# Patient Record
Sex: Female | Born: 1935 | Race: White | Hispanic: No | Smoking: Light tobacco smoker
Health system: Southern US, Community
[De-identification: ages and names within clinical notes are randomized; demographics above are authoritative.]

## PROBLEM LIST (undated history)

## (undated) DIAGNOSIS — R1900 Intra-abdominal and pelvic swelling, mass and lump, unspecified site: Secondary | ICD-10-CM

## (undated) DIAGNOSIS — L570 Actinic keratosis: Secondary | ICD-10-CM

## (undated) DIAGNOSIS — N858 Other specified noninflammatory disorders of uterus: Secondary | ICD-10-CM

## (undated) DIAGNOSIS — C4490 Unspecified malignant neoplasm of skin, unspecified: Secondary | ICD-10-CM

## (undated) DIAGNOSIS — C4491 Basal cell carcinoma of skin, unspecified: Secondary | ICD-10-CM

## (undated) DIAGNOSIS — R102 Pelvic and perineal pain: Secondary | ICD-10-CM

## (undated) DIAGNOSIS — K559 Vascular disorder of intestine, unspecified: Secondary | ICD-10-CM

## (undated) DIAGNOSIS — N719 Inflammatory disease of uterus, unspecified: Secondary | ICD-10-CM

## (undated) DIAGNOSIS — N952 Postmenopausal atrophic vaginitis: Secondary | ICD-10-CM

## (undated) DIAGNOSIS — K219 Gastro-esophageal reflux disease without esophagitis: Secondary | ICD-10-CM

## (undated) HISTORY — DX: Inflammatory disease of uterus, unspecified: N71.9

## (undated) HISTORY — PX: OTHER SURGICAL HISTORY: SHX169

## (undated) HISTORY — DX: Pelvic and perineal pain: R10.2

## (undated) HISTORY — PX: TONSILLECTOMY: SUR1361

## (undated) HISTORY — DX: Other specified noninflammatory disorders of uterus: N85.8

## (undated) HISTORY — DX: Actinic keratosis: L57.0

## (undated) HISTORY — DX: Vascular disorder of intestine, unspecified: K55.9

## (undated) HISTORY — DX: Gastro-esophageal reflux disease without esophagitis: K21.9

## (undated) HISTORY — DX: Intra-abdominal and pelvic swelling, mass and lump, unspecified site: R19.00

## (undated) HISTORY — PX: CHOLECYSTECTOMY: SHX55

## (undated) HISTORY — DX: Postmenopausal atrophic vaginitis: N95.2

## (undated) HISTORY — DX: Unspecified malignant neoplasm of skin, unspecified: C44.90

---
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
## 2004-12-07 ENCOUNTER — Ambulatory Visit: Payer: Self-pay | Admitting: Internal Medicine

## 2005-06-25 ENCOUNTER — Ambulatory Visit: Payer: Self-pay | Admitting: Internal Medicine

## 2005-06-28 ENCOUNTER — Ambulatory Visit: Payer: Self-pay | Admitting: Internal Medicine

## 2005-12-10 ENCOUNTER — Ambulatory Visit: Payer: Self-pay | Admitting: Internal Medicine

## 2006-08-06 ENCOUNTER — Ambulatory Visit: Payer: Self-pay | Admitting: Unknown Physician Specialty

## 2006-10-02 ENCOUNTER — Ambulatory Visit: Payer: Self-pay | Admitting: Internal Medicine

## 2006-12-11 ENCOUNTER — Ambulatory Visit: Payer: Self-pay | Admitting: Unknown Physician Specialty

## 2006-12-25 ENCOUNTER — Ambulatory Visit: Payer: Self-pay | Admitting: Internal Medicine

## 2007-12-26 ENCOUNTER — Ambulatory Visit: Payer: Self-pay | Admitting: Internal Medicine

## 2008-12-27 ENCOUNTER — Ambulatory Visit: Payer: Self-pay | Admitting: Internal Medicine

## 2009-02-26 ENCOUNTER — Emergency Department: Payer: Self-pay | Admitting: Internal Medicine

## 2009-05-18 DIAGNOSIS — C4492 Squamous cell carcinoma of skin, unspecified: Secondary | ICD-10-CM

## 2009-05-18 HISTORY — DX: Squamous cell carcinoma of skin, unspecified: C44.92

## 2009-12-28 ENCOUNTER — Ambulatory Visit: Payer: Self-pay | Admitting: Internal Medicine

## 2010-10-03 ENCOUNTER — Ambulatory Visit: Payer: Self-pay | Admitting: Unknown Physician Specialty

## 2010-10-11 ENCOUNTER — Other Ambulatory Visit: Payer: Self-pay | Admitting: Unknown Physician Specialty

## 2010-11-08 ENCOUNTER — Ambulatory Visit: Payer: Self-pay

## 2010-12-04 ENCOUNTER — Ambulatory Visit: Payer: Self-pay | Admitting: Unknown Physician Specialty

## 2011-01-05 ENCOUNTER — Ambulatory Visit: Payer: Self-pay | Admitting: Internal Medicine

## 2011-02-05 ENCOUNTER — Ambulatory Visit: Payer: Self-pay | Admitting: Ophthalmology

## 2011-02-12 ENCOUNTER — Ambulatory Visit: Payer: Self-pay | Admitting: Ophthalmology

## 2011-06-25 ENCOUNTER — Ambulatory Visit: Payer: Self-pay | Admitting: Ophthalmology

## 2012-01-31 ENCOUNTER — Ambulatory Visit: Payer: Self-pay | Admitting: Internal Medicine

## 2013-01-13 ENCOUNTER — Emergency Department: Payer: Self-pay | Admitting: Emergency Medicine

## 2013-01-13 LAB — COMPREHENSIVE METABOLIC PANEL
Albumin: 3.8 g/dL (ref 3.4–5.0)
Alkaline Phosphatase: 92 U/L (ref 50–136)
Bilirubin,Total: 0.3 mg/dL (ref 0.2–1.0)
Calcium, Total: 9.2 mg/dL (ref 8.5–10.1)
Creatinine: 0.7 mg/dL (ref 0.60–1.30)
Glucose: 140 mg/dL — ABNORMAL HIGH (ref 65–99)
Potassium: 4.6 mmol/L (ref 3.5–5.1)
SGOT(AST): 28 U/L (ref 15–37)
SGPT (ALT): 22 U/L (ref 12–78)
Sodium: 135 mmol/L — ABNORMAL LOW (ref 136–145)

## 2013-01-13 LAB — CBC
HCT: 40.4 % (ref 35.0–47.0)
MCH: 32.6 pg (ref 26.0–34.0)
MCHC: 35.4 g/dL (ref 32.0–36.0)
MCV: 92 fL (ref 80–100)
Platelet: 169 10*3/uL (ref 150–440)
RBC: 4.39 10*6/uL (ref 3.80–5.20)
RDW: 14.8 % — ABNORMAL HIGH (ref 11.5–14.5)
WBC: 8.1 10*3/uL (ref 3.6–11.0)

## 2013-01-13 LAB — URINALYSIS, COMPLETE
Bacteria: NONE SEEN
Bilirubin,UR: NEGATIVE
Glucose,UR: NEGATIVE mg/dL (ref 0–75)
Leukocyte Esterase: NEGATIVE
Protein: NEGATIVE
Specific Gravity: 1.004 (ref 1.003–1.030)
Squamous Epithelial: <1

## 2013-02-02 ENCOUNTER — Ambulatory Visit: Payer: Self-pay | Admitting: Internal Medicine

## 2013-09-20 ENCOUNTER — Emergency Department: Payer: Self-pay | Admitting: Emergency Medicine

## 2013-09-20 LAB — BASIC METABOLIC PANEL
ANION GAP: 4 — AB (ref 7–16)
BUN: 13 mg/dL (ref 7–18)
CALCIUM: 8 mg/dL — AB (ref 8.5–10.1)
Chloride: 98 mmol/L (ref 98–107)
Co2: 26 mmol/L (ref 21–32)
Creatinine: 0.69 mg/dL (ref 0.60–1.30)
EGFR (African American): >60
Glucose: 115 mg/dL — ABNORMAL HIGH (ref 65–99)
Osmolality: 258 (ref 275–301)
POTASSIUM: 3.8 mmol/L (ref 3.5–5.1)
Sodium: 128 mmol/L — ABNORMAL LOW (ref 136–145)

## 2013-09-20 LAB — CBC
HCT: 24.4 % — ABNORMAL LOW (ref 35.0–47.0)
HGB: 7.9 g/dL — AB (ref 12.0–16.0)
MCH: 24.9 pg — AB (ref 26.0–34.0)
MCHC: 32.1 g/dL (ref 32.0–36.0)
MCV: 77 fL — AB (ref 80–100)
PLATELETS: 194 10*3/uL (ref 150–440)
RBC: 3.16 10*6/uL — AB (ref 3.80–5.20)
RDW: 15.1 % — ABNORMAL HIGH (ref 11.5–14.5)
WBC: 7.7 10*3/uL (ref 3.6–11.0)

## 2013-09-20 LAB — PRO B NATRIURETIC PEPTIDE: B-Type Natriuretic Peptide: 179 pg/mL (ref 0–450)

## 2013-09-20 LAB — TROPONIN I

## 2013-09-25 ENCOUNTER — Emergency Department: Payer: Self-pay | Admitting: Emergency Medicine

## 2013-09-25 LAB — COMPREHENSIVE METABOLIC PANEL
ALK PHOS: 96 U/L
Albumin: 3.4 g/dL (ref 3.4–5.0)
Anion Gap: 6 — ABNORMAL LOW (ref 7–16)
BILIRUBIN TOTAL: 0.1 mg/dL — AB (ref 0.2–1.0)
BUN: 16 mg/dL (ref 7–18)
Calcium, Total: 8.4 mg/dL — ABNORMAL LOW (ref 8.5–10.1)
Chloride: 95 mmol/L — ABNORMAL LOW (ref 98–107)
Co2: 27 mmol/L (ref 21–32)
Creatinine: 0.99 mg/dL (ref 0.60–1.30)
EGFR (African American): >60
GFR CALC NON AF AMER: 53 — AB
GLUCOSE: 190 mg/dL — AB (ref 65–99)
Osmolality: 263 (ref 275–301)
Potassium: 4.2 mmol/L (ref 3.5–5.1)
SGOT(AST): 28 U/L (ref 15–37)
SGPT (ALT): 26 U/L (ref 12–78)
SODIUM: 128 mmol/L — AB (ref 136–145)
TOTAL PROTEIN: 7.3 g/dL (ref 6.4–8.2)

## 2013-09-25 LAB — CBC
HCT: 22.3 % — ABNORMAL LOW (ref 35.0–47.0)
HGB: 7.1 g/dL — ABNORMAL LOW (ref 12.0–16.0)
MCH: 24.6 pg — ABNORMAL LOW (ref 26.0–34.0)
MCHC: 31.9 g/dL — ABNORMAL LOW (ref 32.0–36.0)
MCV: 77 fL — ABNORMAL LOW (ref 80–100)
Platelet: 303 10*3/uL (ref 150–440)
RBC: 2.89 10*6/uL — AB (ref 3.80–5.20)
RDW: 15.5 % — ABNORMAL HIGH (ref 11.5–14.5)
WBC: 9.8 10*3/uL (ref 3.6–11.0)

## 2013-09-29 ENCOUNTER — Ambulatory Visit: Payer: Self-pay | Admitting: Gastroenterology

## 2013-09-29 ENCOUNTER — Inpatient Hospital Stay: Payer: Self-pay | Admitting: Surgery

## 2013-09-29 LAB — URINALYSIS, COMPLETE
BLOOD: NEGATIVE
Bilirubin,UR: NEGATIVE
Glucose,UR: 150 mg/dL (ref 0–75)
Ketone: NEGATIVE
LEUKOCYTE ESTERASE: NEGATIVE
NITRITE: NEGATIVE
Ph: 5 (ref 4.5–8.0)
Protein: NEGATIVE
RBC,UR: 1 /HPF (ref 0–5)
Specific Gravity: 1.017 (ref 1.003–1.030)
Squamous Epithelial: <1
WBC UR: 1 /HPF (ref 0–5)

## 2013-09-29 LAB — CBC WITH DIFFERENTIAL/PLATELET
BASOS ABS: 0.1 10*3/uL (ref 0.0–0.1)
Basophil %: 0.3 %
EOS PCT: 0.2 %
Eosinophil #: 0 10*3/uL (ref 0.0–0.7)
HCT: 30.3 % — AB (ref 35.0–47.0)
HGB: 9.3 g/dL — ABNORMAL LOW (ref 12.0–16.0)
LYMPHS ABS: 0.8 10*3/uL — AB (ref 1.0–3.6)
LYMPHS PCT: 4.3 %
MCH: 25 pg — ABNORMAL LOW (ref 26.0–34.0)
MCHC: 30.7 g/dL — AB (ref 32.0–36.0)
MCV: 82 fL (ref 80–100)
Monocyte #: 0.5 x10 3/mm (ref 0.2–0.9)
Monocyte %: 2.7 %
NEUTROS ABS: 17.6 10*3/uL — AB (ref 1.4–6.5)
Neutrophil %: 92.5 %
Platelet: 357 10*3/uL (ref 150–440)
RBC: 3.72 10*6/uL — ABNORMAL LOW (ref 3.80–5.20)
RDW: 17.4 % — AB (ref 11.5–14.5)
WBC: 19 10*3/uL — ABNORMAL HIGH (ref 3.6–11.0)

## 2013-09-29 LAB — KOH PREP

## 2013-09-29 LAB — COMPREHENSIVE METABOLIC PANEL
Albumin: 3.5 g/dL (ref 3.4–5.0)
Alkaline Phosphatase: 85 U/L
Anion Gap: 6 — ABNORMAL LOW (ref 7–16)
BUN: 10 mg/dL (ref 7–18)
Bilirubin,Total: 0.3 mg/dL (ref 0.2–1.0)
CALCIUM: 8.3 mg/dL — AB (ref 8.5–10.1)
Chloride: 101 mmol/L (ref 98–107)
Co2: 26 mmol/L (ref 21–32)
Creatinine: 0.77 mg/dL (ref 0.60–1.30)
EGFR (African American): >60
Glucose: 184 mg/dL — ABNORMAL HIGH (ref 65–99)
OSMOLALITY: 270 (ref 275–301)
POTASSIUM: 3.7 mmol/L (ref 3.5–5.1)
SGOT(AST): 24 U/L (ref 15–37)
SGPT (ALT): 24 U/L (ref 12–78)
Sodium: 133 mmol/L — ABNORMAL LOW (ref 136–145)
TOTAL PROTEIN: 7.4 g/dL (ref 6.4–8.2)

## 2013-09-30 LAB — BASIC METABOLIC PANEL
Anion Gap: 8 (ref 7–16)
BUN: 9 mg/dL (ref 7–18)
CALCIUM: 7.7 mg/dL — AB (ref 8.5–10.1)
CO2: 23 mmol/L (ref 21–32)
Chloride: 103 mmol/L (ref 98–107)
Creatinine: 0.78 mg/dL (ref 0.60–1.30)
EGFR (African American): >60
EGFR (Non-African Amer.): >60
GLUCOSE: 137 mg/dL — AB (ref 65–99)
Osmolality: 269 (ref 275–301)
Potassium: 3.7 mmol/L (ref 3.5–5.1)
Sodium: 134 mmol/L — ABNORMAL LOW (ref 136–145)

## 2013-09-30 LAB — CBC WITH DIFFERENTIAL/PLATELET
BASOS ABS: 0 10*3/uL (ref 0.0–0.1)
BASOS PCT: 0.1 %
EOS ABS: 0 10*3/uL (ref 0.0–0.7)
Eosinophil %: 0 %
HCT: 23.8 % — AB (ref 35.0–47.0)
HGB: 7.6 g/dL — ABNORMAL LOW (ref 12.0–16.0)
Lymphocyte #: 1 10*3/uL (ref 1.0–3.6)
Lymphocyte %: 5.4 %
MCH: 25.9 pg — ABNORMAL LOW (ref 26.0–34.0)
MCHC: 32 g/dL (ref 32.0–36.0)
MCV: 81 fL (ref 80–100)
MONO ABS: 0.9 x10 3/mm (ref 0.2–0.9)
Monocyte %: 4.4 %
Neutrophil #: 17.6 10*3/uL — ABNORMAL HIGH (ref 1.4–6.5)
Neutrophil %: 90.1 %
Platelet: 255 10*3/uL (ref 150–440)
RBC: 2.94 10*6/uL — ABNORMAL LOW (ref 3.80–5.20)
RDW: 17 % — ABNORMAL HIGH (ref 11.5–14.5)
WBC: 19.5 10*3/uL — ABNORMAL HIGH (ref 3.6–11.0)

## 2013-10-01 LAB — CBC WITH DIFFERENTIAL/PLATELET
BASOS PCT: 0.7 %
Basophil #: 0.1 10*3/uL (ref 0.0–0.1)
Eosinophil #: 0 10*3/uL (ref 0.0–0.7)
Eosinophil %: 0.2 %
HCT: 27 % — ABNORMAL LOW (ref 35.0–47.0)
HGB: 8.5 g/dL — ABNORMAL LOW (ref 12.0–16.0)
Lymphocyte #: 0.5 10*3/uL — ABNORMAL LOW (ref 1.0–3.6)
Lymphocyte %: 2.6 %
MCH: 25.8 pg — AB (ref 26.0–34.0)
MCHC: 31.7 g/dL — ABNORMAL LOW (ref 32.0–36.0)
MCV: 81 fL (ref 80–100)
MONOS PCT: 3.1 %
Monocyte #: 0.6 x10 3/mm (ref 0.2–0.9)
NEUTROS ABS: 16.9 10*3/uL — AB (ref 1.4–6.5)
Neutrophil %: 93.4 %
PLATELETS: 246 10*3/uL (ref 150–440)
RBC: 3.32 10*6/uL — ABNORMAL LOW (ref 3.80–5.20)
RDW: 17.9 % — AB (ref 11.5–14.5)
WBC: 18 10*3/uL — ABNORMAL HIGH (ref 3.6–11.0)

## 2013-10-01 LAB — PATHOLOGY REPORT

## 2013-10-02 LAB — CBC WITH DIFFERENTIAL/PLATELET
Basophil #: 0 10*3/uL (ref 0.0–0.1)
Basophil %: 0.1 %
Eosinophil #: 0 10*3/uL (ref 0.0–0.7)
Eosinophil %: 0.1 %
HCT: 24.4 % — AB (ref 35.0–47.0)
HGB: 7.7 g/dL — ABNORMAL LOW (ref 12.0–16.0)
Lymphocyte #: 0.9 10*3/uL — ABNORMAL LOW (ref 1.0–3.6)
Lymphocyte %: 5.2 %
MCH: 25.9 pg — ABNORMAL LOW (ref 26.0–34.0)
MCHC: 31.7 g/dL — AB (ref 32.0–36.0)
MCV: 82 fL (ref 80–100)
Monocyte #: 0.6 x10 3/mm (ref 0.2–0.9)
Monocyte %: 3.4 %
NEUTROS PCT: 91.2 %
Neutrophil #: 15.3 10*3/uL — ABNORMAL HIGH (ref 1.4–6.5)
PLATELETS: 211 10*3/uL (ref 150–440)
RBC: 2.98 10*6/uL — AB (ref 3.80–5.20)
RDW: 19.9 % — ABNORMAL HIGH (ref 11.5–14.5)
WBC: 16.8 10*3/uL — ABNORMAL HIGH (ref 3.6–11.0)

## 2013-10-02 LAB — BASIC METABOLIC PANEL
Anion Gap: 4 — ABNORMAL LOW (ref 7–16)
BUN: 10 mg/dL (ref 7–18)
CHLORIDE: 98 mmol/L (ref 98–107)
CREATININE: 0.94 mg/dL (ref 0.60–1.30)
Calcium, Total: 7.6 mg/dL — ABNORMAL LOW (ref 8.5–10.1)
Co2: 24 mmol/L (ref 21–32)
EGFR (Non-African Amer.): 56 — ABNORMAL LOW
GLUCOSE: 125 mg/dL — AB (ref 65–99)
OSMOLALITY: 254 (ref 275–301)
POTASSIUM: 4 mmol/L (ref 3.5–5.1)
SODIUM: 126 mmol/L — AB (ref 136–145)

## 2013-10-02 LAB — PROTIME-INR
INR: 1.6
PROTHROMBIN TIME: 18.5 s — AB (ref 11.5–14.7)

## 2013-10-03 LAB — CBC WITH DIFFERENTIAL/PLATELET
Basophil #: 0 10*3/uL (ref 0.0–0.1)
Basophil %: 0.3 %
EOS ABS: 0 10*3/uL (ref 0.0–0.7)
Eosinophil %: 0.2 %
HCT: 24.5 % — ABNORMAL LOW (ref 35.0–47.0)
HGB: 7.6 g/dL — ABNORMAL LOW (ref 12.0–16.0)
Lymphocyte #: 0.6 10*3/uL — ABNORMAL LOW (ref 1.0–3.6)
Lymphocyte %: 4.2 %
MCH: 25.4 pg — ABNORMAL LOW (ref 26.0–34.0)
MCHC: 31.2 g/dL — AB (ref 32.0–36.0)
MCV: 82 fL (ref 80–100)
Monocyte #: 0.7 x10 3/mm (ref 0.2–0.9)
Monocyte %: 4.8 %
Neutrophil #: 13.4 10*3/uL — ABNORMAL HIGH (ref 1.4–6.5)
Neutrophil %: 90.5 %
Platelet: 225 10*3/uL (ref 150–440)
RBC: 3 10*6/uL — ABNORMAL LOW (ref 3.80–5.20)
RDW: 20.7 % — ABNORMAL HIGH (ref 11.5–14.5)
WBC: 14.8 10*3/uL — ABNORMAL HIGH (ref 3.6–11.0)

## 2013-10-03 LAB — BASIC METABOLIC PANEL
Anion Gap: 5 — ABNORMAL LOW (ref 7–16)
BUN: 8 mg/dL (ref 7–18)
CREATININE: 0.8 mg/dL (ref 0.60–1.30)
Calcium, Total: 7.3 mg/dL — ABNORMAL LOW (ref 8.5–10.1)
Chloride: 92 mmol/L — ABNORMAL LOW (ref 98–107)
Co2: 24 mmol/L (ref 21–32)
Glucose: 201 mg/dL — ABNORMAL HIGH (ref 65–99)
Osmolality: 248 (ref 275–301)
POTASSIUM: 4.2 mmol/L (ref 3.5–5.1)
Sodium: 121 mmol/L — ABNORMAL LOW (ref 136–145)

## 2013-10-03 LAB — SODIUM, URINE, RANDOM: Sodium, Urine Random: 83 mmol/L (ref 20–110)

## 2013-10-03 LAB — OSMOLALITY, URINE: Osmolality: 569 mOsm/kg

## 2013-10-04 LAB — CBC WITH DIFFERENTIAL/PLATELET
BASOS ABS: 0 10*3/uL (ref 0.0–0.1)
BASOS PCT: 0.3 %
Eosinophil #: 0 10*3/uL (ref 0.0–0.7)
Eosinophil %: 0.4 %
HCT: 23.1 % — ABNORMAL LOW (ref 35.0–47.0)
HGB: 7.7 g/dL — AB (ref 12.0–16.0)
LYMPHS PCT: 9.1 %
Lymphocyte #: 0.9 10*3/uL — ABNORMAL LOW (ref 1.0–3.6)
MCH: 26.6 pg (ref 26.0–34.0)
MCHC: 33.5 g/dL (ref 32.0–36.0)
MCV: 80 fL (ref 80–100)
MONO ABS: 0.8 x10 3/mm (ref 0.2–0.9)
Monocyte %: 8.3 %
NEUTROS ABS: 8 10*3/uL — AB (ref 1.4–6.5)
Neutrophil %: 81.9 %
PLATELETS: 225 10*3/uL (ref 150–440)
RBC: 2.9 10*6/uL — ABNORMAL LOW (ref 3.80–5.20)
RDW: 19.9 % — ABNORMAL HIGH (ref 11.5–14.5)
WBC: 9.8 10*3/uL (ref 3.6–11.0)

## 2013-10-04 LAB — BASIC METABOLIC PANEL
Anion Gap: 9 (ref 7–16)
BUN: 6 mg/dL — AB (ref 7–18)
CHLORIDE: 90 mmol/L — AB (ref 98–107)
Calcium, Total: 7.4 mg/dL — ABNORMAL LOW (ref 8.5–10.1)
Co2: 24 mmol/L (ref 21–32)
Creatinine: 0.71 mg/dL (ref 0.60–1.30)
EGFR (African American): >60
EGFR (Non-African Amer.): >60
GLUCOSE: 104 mg/dL — AB (ref 65–99)
Osmolality: 246 (ref 275–301)
Potassium: 3.9 mmol/L (ref 3.5–5.1)
Sodium: 123 mmol/L — ABNORMAL LOW (ref 136–145)

## 2013-10-05 LAB — BASIC METABOLIC PANEL
Anion Gap: 6 — ABNORMAL LOW (ref 7–16)
BUN: 6 mg/dL — ABNORMAL LOW (ref 7–18)
CO2: 28 mmol/L (ref 21–32)
Calcium, Total: 7.8 mg/dL — ABNORMAL LOW (ref 8.5–10.1)
Chloride: 87 mmol/L — ABNORMAL LOW (ref 98–107)
Creatinine: 0.64 mg/dL (ref 0.60–1.30)
EGFR (Non-African Amer.): >60
Glucose: 95 mg/dL (ref 65–99)
OSMOLALITY: 241 (ref 275–301)
POTASSIUM: 3.7 mmol/L (ref 3.5–5.1)
Sodium: 121 mmol/L — ABNORMAL LOW (ref 136–145)

## 2013-10-05 LAB — CBC WITH DIFFERENTIAL/PLATELET
Basophil #: 0 10*3/uL (ref 0.0–0.1)
Basophil %: 0.4 %
Eosinophil #: 0 10*3/uL (ref 0.0–0.7)
Eosinophil %: 0.5 %
HCT: 23.4 % — AB (ref 35.0–47.0)
HGB: 7.8 g/dL — ABNORMAL LOW (ref 12.0–16.0)
LYMPHS ABS: 0.5 10*3/uL — AB (ref 1.0–3.6)
Lymphocyte %: 7 %
MCH: 26.3 pg (ref 26.0–34.0)
MCHC: 33.5 g/dL (ref 32.0–36.0)
MCV: 78 fL — ABNORMAL LOW (ref 80–100)
MONO ABS: 0.8 x10 3/mm (ref 0.2–0.9)
Monocyte %: 10.5 %
NEUTROS ABS: 6 10*3/uL (ref 1.4–6.5)
Neutrophil %: 81.6 %
Platelet: 253 10*3/uL (ref 150–440)
RBC: 2.98 10*6/uL — ABNORMAL LOW (ref 3.80–5.20)
RDW: 19.5 % — AB (ref 11.5–14.5)
WBC: 7.4 10*3/uL (ref 3.6–11.0)

## 2013-10-09 ENCOUNTER — Other Ambulatory Visit: Payer: Self-pay | Admitting: Surgery

## 2013-10-09 LAB — CBC WITH DIFFERENTIAL/PLATELET
BASOS PCT: 1.3 %
Basophil #: 0.1 10*3/uL (ref 0.0–0.1)
Eosinophil #: 0.1 10*3/uL (ref 0.0–0.7)
Eosinophil %: 0.5 %
HCT: 27 % — AB (ref 35.0–47.0)
HGB: 8.7 g/dL — AB (ref 12.0–16.0)
LYMPHS PCT: 15.9 %
Lymphocyte #: 1.7 10*3/uL (ref 1.0–3.6)
MCH: 26.1 pg (ref 26.0–34.0)
MCHC: 32.2 g/dL (ref 32.0–36.0)
MCV: 81 fL (ref 80–100)
MONO ABS: 0.5 x10 3/mm (ref 0.2–0.9)
MONOS PCT: 4.5 %
NEUTROS ABS: 8.1 10*3/uL — AB (ref 1.4–6.5)
Neutrophil %: 77.8 %
PLATELETS: 642 10*3/uL — AB (ref 150–440)
RBC: 3.34 10*6/uL — AB (ref 3.80–5.20)
RDW: 21 % — ABNORMAL HIGH (ref 11.5–14.5)
WBC: 10.4 10*3/uL (ref 3.6–11.0)

## 2013-10-09 LAB — BASIC METABOLIC PANEL
Anion Gap: 9 (ref 7–16)
BUN: 9 mg/dL (ref 7–18)
CHLORIDE: 95 mmol/L — AB (ref 98–107)
Calcium, Total: 8.2 mg/dL — ABNORMAL LOW (ref 8.5–10.1)
Co2: 26 mmol/L (ref 21–32)
Creatinine: 0.52 mg/dL — ABNORMAL LOW (ref 0.60–1.30)
Glucose: 105 mg/dL — ABNORMAL HIGH (ref 65–99)
Osmolality: 260 (ref 275–301)
Potassium: 3.9 mmol/L (ref 3.5–5.1)
SODIUM: 130 mmol/L — AB (ref 136–145)

## 2013-10-09 LAB — IRON AND TIBC
IRON SATURATION: 9 %
Iron Bind.Cap.(Total): 347 ug/dL (ref 250–450)
Iron: 31 ug/dL — ABNORMAL LOW (ref 50–170)
Unbound Iron-Bind.Cap.: 316 ug/dL

## 2013-10-09 LAB — RETICULOCYTES
ABSOLUTE RETIC COUNT: 0.1529 10*6/uL (ref 0.019–0.186)
RETICULOCYTE: 4.57 % — AB (ref 0.4–3.1)

## 2013-10-09 LAB — CLOSTRIDIUM DIFFICILE(ARMC)

## 2013-10-25 ENCOUNTER — Inpatient Hospital Stay: Payer: Self-pay | Admitting: Surgery

## 2013-10-25 LAB — CBC WITH DIFFERENTIAL/PLATELET
BASOS ABS: 0.1 10*3/uL (ref 0.0–0.1)
Basophil %: 0.3 %
EOS PCT: 0 %
Eosinophil #: 0 10*3/uL (ref 0.0–0.7)
HCT: 28.8 % — ABNORMAL LOW (ref 35.0–47.0)
HGB: 9 g/dL — ABNORMAL LOW (ref 12.0–16.0)
LYMPHS ABS: 1.3 10*3/uL (ref 1.0–3.6)
Lymphocyte %: 7.3 %
MCH: 24.8 pg — AB (ref 26.0–34.0)
MCHC: 31.2 g/dL — ABNORMAL LOW (ref 32.0–36.0)
MCV: 80 fL (ref 80–100)
MONOS PCT: 8.3 %
Monocyte #: 1.5 x10 3/mm — ABNORMAL HIGH (ref 0.2–0.9)
NEUTROS PCT: 84.1 %
Neutrophil #: 15.4 10*3/uL — ABNORMAL HIGH (ref 1.4–6.5)
Platelet: 415 10*3/uL (ref 150–440)
RBC: 3.62 10*6/uL — AB (ref 3.80–5.20)
RDW: 21.7 % — ABNORMAL HIGH (ref 11.5–14.5)
WBC: 18.4 10*3/uL — AB (ref 3.6–11.0)

## 2013-10-25 LAB — COMPREHENSIVE METABOLIC PANEL
ALBUMIN: 2.8 g/dL — AB (ref 3.4–5.0)
AST: 22 U/L (ref 15–37)
Alkaline Phosphatase: 103 U/L
Anion Gap: 7 (ref 7–16)
BILIRUBIN TOTAL: 0.4 mg/dL (ref 0.2–1.0)
BUN: 12 mg/dL (ref 7–18)
Calcium, Total: 8.4 mg/dL — ABNORMAL LOW (ref 8.5–10.1)
Chloride: 93 mmol/L — ABNORMAL LOW (ref 98–107)
Co2: 25 mmol/L (ref 21–32)
Creatinine: 0.79 mg/dL (ref 0.60–1.30)
EGFR (African American): >60
Glucose: 136 mg/dL — ABNORMAL HIGH (ref 65–99)
Osmolality: 253 (ref 275–301)
POTASSIUM: 4 mmol/L (ref 3.5–5.1)
SGPT (ALT): 16 U/L (ref 12–78)
Sodium: 125 mmol/L — ABNORMAL LOW (ref 136–145)
TOTAL PROTEIN: 7.7 g/dL (ref 6.4–8.2)

## 2013-10-25 LAB — URINALYSIS, COMPLETE
Bilirubin,UR: NEGATIVE
Glucose,UR: NEGATIVE mg/dL (ref 0–75)
KETONE: NEGATIVE
Nitrite: NEGATIVE
PH: 5 (ref 4.5–8.0)
Protein: 30
RBC,UR: 10 /HPF (ref 0–5)
Specific Gravity: 1.023 (ref 1.003–1.030)
Squamous Epithelial: NONE SEEN
WBC UR: 65 /HPF (ref 0–5)

## 2013-10-25 LAB — LIPASE, BLOOD: LIPASE: 119 U/L (ref 73–393)

## 2013-10-26 LAB — BASIC METABOLIC PANEL
Anion Gap: 4 — ABNORMAL LOW (ref 7–16)
BUN: 9 mg/dL (ref 7–18)
CREATININE: 0.78 mg/dL (ref 0.60–1.30)
Calcium, Total: 7.8 mg/dL — ABNORMAL LOW (ref 8.5–10.1)
Chloride: 93 mmol/L — ABNORMAL LOW (ref 98–107)
Co2: 28 mmol/L (ref 21–32)
EGFR (Non-African Amer.): >60
GLUCOSE: 121 mg/dL — AB (ref 65–99)
Osmolality: 251 (ref 275–301)
POTASSIUM: 3.5 mmol/L (ref 3.5–5.1)
Sodium: 125 mmol/L — ABNORMAL LOW (ref 136–145)

## 2013-10-26 LAB — CBC WITH DIFFERENTIAL/PLATELET
BASOS ABS: 0.1 10*3/uL (ref 0.0–0.1)
Basophil %: 0.4 %
EOS ABS: 0 10*3/uL (ref 0.0–0.7)
EOS PCT: 0.2 %
HCT: 24.3 % — ABNORMAL LOW (ref 35.0–47.0)
HGB: 7.7 g/dL — AB (ref 12.0–16.0)
Lymphocyte #: 1.2 10*3/uL (ref 1.0–3.6)
Lymphocyte %: 8.1 %
MCH: 25 pg — AB (ref 26.0–34.0)
MCHC: 31.7 g/dL — ABNORMAL LOW (ref 32.0–36.0)
MCV: 79 fL — ABNORMAL LOW (ref 80–100)
MONO ABS: 1.5 x10 3/mm — AB (ref 0.2–0.9)
MONOS PCT: 10.2 %
NEUTROS ABS: 12 10*3/uL — AB (ref 1.4–6.5)
Neutrophil %: 81.1 %
PLATELETS: 346 10*3/uL (ref 150–440)
RBC: 3.09 10*6/uL — ABNORMAL LOW (ref 3.80–5.20)
RDW: 21.7 % — AB (ref 11.5–14.5)
WBC: 14.8 10*3/uL — ABNORMAL HIGH (ref 3.6–11.0)

## 2013-10-26 LAB — OSMOLALITY: Osmolality: 264 mOsm/kg — ABNORMAL LOW (ref 280–301)

## 2013-10-27 LAB — CBC WITH DIFFERENTIAL/PLATELET
Basophil #: 0 10*3/uL (ref 0.0–0.1)
Basophil %: 0.4 %
EOS ABS: 0 10*3/uL (ref 0.0–0.7)
Eosinophil %: 0.1 %
HCT: 25.3 % — ABNORMAL LOW (ref 35.0–47.0)
HGB: 8.1 g/dL — ABNORMAL LOW (ref 12.0–16.0)
Lymphocyte #: 1 10*3/uL (ref 1.0–3.6)
Lymphocyte %: 8.9 %
MCH: 25.5 pg — ABNORMAL LOW (ref 26.0–34.0)
MCHC: 32 g/dL (ref 32.0–36.0)
MCV: 80 fL (ref 80–100)
Monocyte #: 1.1 x10 3/mm — ABNORMAL HIGH (ref 0.2–0.9)
Monocyte %: 9.4 %
Neutrophil #: 9.5 10*3/uL — ABNORMAL HIGH (ref 1.4–6.5)
Neutrophil %: 81.2 %
Platelet: 368 10*3/uL (ref 150–440)
RBC: 3.17 10*6/uL — ABNORMAL LOW (ref 3.80–5.20)
RDW: 21.3 % — ABNORMAL HIGH (ref 11.5–14.5)
WBC: 11.7 10*3/uL — ABNORMAL HIGH (ref 3.6–11.0)

## 2013-10-27 LAB — CLOSTRIDIUM DIFFICILE(ARMC)

## 2013-10-27 LAB — SODIUM: Sodium: 128 mmol/L — ABNORMAL LOW (ref 136–145)

## 2013-10-27 LAB — CHLORIDE, URINE, RANDOM: Chloride, Urine Random: 22 mmol/L — ABNORMAL LOW (ref 55–125)

## 2013-10-27 LAB — OSMOLALITY, URINE: Osmolality: 134 mOsm/kg

## 2013-10-27 LAB — SODIUM, URINE, RANDOM: Sodium, Urine Random: 21 mmol/L (ref 20–110)

## 2013-10-27 LAB — POTASSIUM, URINE RANDOM: Potassium, Urine Random: 7 mmol/L — ABNORMAL LOW (ref 55–125)

## 2013-10-28 LAB — CBC WITH DIFFERENTIAL/PLATELET
Basophil #: 0 10*3/uL (ref 0.0–0.1)
Basophil #: 0 10*3/uL (ref 0.0–0.1)
Basophil %: 0.5 %
Basophil %: 0.4 %
EOS ABS: 0 10*3/uL (ref 0.0–0.7)
EOS PCT: 0.3 %
Eosinophil #: 0 10*3/uL (ref 0.0–0.7)
Eosinophil %: 0.1 %
HCT: 22.1 % — AB (ref 35.0–47.0)
HCT: 24.6 % — ABNORMAL LOW (ref 35.0–47.0)
HGB: 7.3 g/dL — ABNORMAL LOW (ref 12.0–16.0)
HGB: 7.7 g/dL — AB (ref 12.0–16.0)
LYMPHS ABS: 1.3 10*3/uL (ref 1.0–3.6)
LYMPHS ABS: 1.3 10*3/uL (ref 1.0–3.6)
Lymphocyte %: 13.3 %
Lymphocyte %: 11.8 %
MCH: 25.9 pg — ABNORMAL LOW (ref 26.0–34.0)
MCH: 24.4 pg — AB (ref 26.0–34.0)
MCHC: 33 g/dL (ref 32.0–36.0)
MCHC: 31.3 g/dL — ABNORMAL LOW (ref 32.0–36.0)
MCV: 78 fL — AB (ref 80–100)
MCV: 78 fL — AB (ref 80–100)
MONOS PCT: 9.3 %
MONOS PCT: 9.2 %
Monocyte #: 0.9 x10 3/mm (ref 0.2–0.9)
Monocyte #: 1 x10 3/mm — ABNORMAL HIGH (ref 0.2–0.9)
NEUTROS PCT: 76.6 %
NEUTROS PCT: 78.5 %
Neutrophil #: 7.8 10*3/uL — ABNORMAL HIGH (ref 1.4–6.5)
Neutrophil #: 8.7 10*3/uL — ABNORMAL HIGH (ref 1.4–6.5)
PLATELETS: 339 10*3/uL (ref 150–440)
PLATELETS: 396 10*3/uL (ref 150–440)
RBC: 2.81 10*6/uL — AB (ref 3.80–5.20)
RBC: 3.16 10*6/uL — AB (ref 3.80–5.20)
RDW: 21.2 % — AB (ref 11.5–14.5)
RDW: 21 % — ABNORMAL HIGH (ref 11.5–14.5)
WBC: 10.1 10*3/uL (ref 3.6–11.0)
WBC: 11.2 10*3/uL — AB (ref 3.6–11.0)

## 2013-10-28 LAB — BASIC METABOLIC PANEL
ANION GAP: 6 — AB (ref 7–16)
BUN: 6 mg/dL — ABNORMAL LOW (ref 7–18)
CO2: 27 mmol/L (ref 21–32)
Calcium, Total: 7.9 mg/dL — ABNORMAL LOW (ref 8.5–10.1)
Chloride: 94 mmol/L — ABNORMAL LOW (ref 98–107)
Creatinine: 0.7 mg/dL (ref 0.60–1.30)
EGFR (African American): >60
GLUCOSE: 87 mg/dL (ref 65–99)
Osmolality: 252 (ref 275–301)
Potassium: 3.7 mmol/L (ref 3.5–5.1)
Sodium: 127 mmol/L — ABNORMAL LOW (ref 136–145)

## 2013-10-28 LAB — PROTEIN ELECTROPHORESIS(ARMC)

## 2013-10-28 LAB — HEMOGLOBIN: HGB: 8.2 g/dL — ABNORMAL LOW (ref 12.0–16.0)

## 2013-10-28 LAB — KAPPA/LAMBDA FREE LIGHT CHAINS (ARMC)

## 2013-10-29 LAB — BASIC METABOLIC PANEL
Anion Gap: 10 (ref 7–16)
BUN: 6 mg/dL — ABNORMAL LOW (ref 7–18)
CHLORIDE: 95 mmol/L — AB (ref 98–107)
CREATININE: 0.59 mg/dL — AB (ref 0.60–1.30)
Calcium, Total: 7.7 mg/dL — ABNORMAL LOW (ref 8.5–10.1)
Co2: 24 mmol/L (ref 21–32)
EGFR (African American): >60
EGFR (Non-African Amer.): >60
GLUCOSE: 136 mg/dL — AB (ref 65–99)
Osmolality: 259 (ref 275–301)
Potassium: 3.6 mmol/L (ref 3.5–5.1)
SODIUM: 129 mmol/L — AB (ref 136–145)

## 2013-10-29 LAB — CBC WITH DIFFERENTIAL/PLATELET
Basophil #: 0 10*3/uL (ref 0.0–0.1)
Basophil %: 0.4 %
EOS ABS: 0 10*3/uL (ref 0.0–0.7)
EOS PCT: 0 %
HCT: 24.4 % — ABNORMAL LOW (ref 35.0–47.0)
HGB: 7.9 g/dL — ABNORMAL LOW (ref 12.0–16.0)
LYMPHS ABS: 0.7 10*3/uL — AB (ref 1.0–3.6)
LYMPHS PCT: 8.4 %
MCH: 25.1 pg — ABNORMAL LOW (ref 26.0–34.0)
MCHC: 32.4 g/dL (ref 32.0–36.0)
MCV: 78 fL — ABNORMAL LOW (ref 80–100)
Monocyte #: 0.2 x10 3/mm (ref 0.2–0.9)
Monocyte %: 1.8 %
Neutrophil #: 7.5 10*3/uL — ABNORMAL HIGH (ref 1.4–6.5)
Neutrophil %: 89.4 %
Platelet: 374 10*3/uL (ref 150–440)
RBC: 3.15 10*6/uL — ABNORMAL LOW (ref 3.80–5.20)
RDW: 20.8 % — ABNORMAL HIGH (ref 11.5–14.5)
WBC: 8.3 10*3/uL (ref 3.6–11.0)

## 2013-10-29 LAB — UR PROT ELECTROPHORESIS, URINE RANDOM

## 2013-11-25 ENCOUNTER — Ambulatory Visit: Payer: Self-pay | Admitting: Surgery

## 2013-12-05 ENCOUNTER — Inpatient Hospital Stay: Payer: Self-pay | Admitting: Internal Medicine

## 2013-12-05 LAB — CBC
HCT: 30.3 % — ABNORMAL LOW (ref 35.0–47.0)
HGB: 9.7 g/dL — AB (ref 12.0–16.0)
MCH: 25.7 pg — ABNORMAL LOW (ref 26.0–34.0)
MCHC: 32.1 g/dL (ref 32.0–36.0)
MCV: 80 fL (ref 80–100)
PLATELETS: 358 10*3/uL (ref 150–440)
RBC: 3.78 10*6/uL — AB (ref 3.80–5.20)
RDW: 22.5 % — ABNORMAL HIGH (ref 11.5–14.5)
WBC: 13.1 10*3/uL — ABNORMAL HIGH (ref 3.6–11.0)

## 2013-12-05 LAB — COMPREHENSIVE METABOLIC PANEL
ALT: 16 U/L (ref 12–78)
AST: 19 U/L (ref 15–37)
Albumin: 2.5 g/dL — ABNORMAL LOW (ref 3.4–5.0)
Alkaline Phosphatase: 116 U/L
Anion Gap: 6 — ABNORMAL LOW (ref 7–16)
BILIRUBIN TOTAL: 0.2 mg/dL (ref 0.2–1.0)
BUN: 11 mg/dL (ref 7–18)
CALCIUM: 8.6 mg/dL (ref 8.5–10.1)
CO2: 30 mmol/L (ref 21–32)
CREATININE: 0.72 mg/dL (ref 0.60–1.30)
Chloride: 97 mmol/L — ABNORMAL LOW (ref 98–107)
Glucose: 117 mg/dL — ABNORMAL HIGH (ref 65–99)
Osmolality: 267 (ref 275–301)
Potassium: 3.5 mmol/L (ref 3.5–5.1)
Sodium: 133 mmol/L — ABNORMAL LOW (ref 136–145)
Total Protein: 7.6 g/dL (ref 6.4–8.2)

## 2013-12-05 LAB — URINALYSIS, COMPLETE
Bilirubin,UR: NEGATIVE
GLUCOSE, UR: NEGATIVE mg/dL (ref 0–75)
Ketone: NEGATIVE
Nitrite: NEGATIVE
Ph: 7 (ref 4.5–8.0)
RBC,UR: 270 /HPF (ref 0–5)
SPECIFIC GRAVITY: 1.006 (ref 1.003–1.030)
Squamous Epithelial: NONE SEEN
WBC UR: 2432 /HPF (ref 0–5)

## 2013-12-05 LAB — MAGNESIUM: MAGNESIUM: 1.9 mg/dL

## 2013-12-05 LAB — LIPASE, BLOOD: Lipase: 97 U/L (ref 73–393)

## 2013-12-05 LAB — PROTIME-INR
INR: 1.2
Prothrombin Time: 14.9 secs — ABNORMAL HIGH (ref 11.5–14.7)

## 2013-12-05 LAB — APTT: ACTIVATED PTT: 31.5 s (ref 23.6–35.9)

## 2013-12-06 LAB — CBC WITH DIFFERENTIAL/PLATELET
BASOS ABS: 0 10*3/uL (ref 0.0–0.1)
Basophil %: 0.6 %
EOS ABS: 0.1 10*3/uL (ref 0.0–0.7)
EOS PCT: 1.2 %
HCT: 29.7 % — AB (ref 35.0–47.0)
HGB: 9.6 g/dL — ABNORMAL LOW (ref 12.0–16.0)
LYMPHS ABS: 2.5 10*3/uL (ref 1.0–3.6)
LYMPHS PCT: 40.8 %
MCH: 25.9 pg — ABNORMAL LOW (ref 26.0–34.0)
MCHC: 32.4 g/dL (ref 32.0–36.0)
MCV: 80 fL (ref 80–100)
MONO ABS: 0.4 x10 3/mm (ref 0.2–0.9)
Monocyte %: 6.2 %
NEUTROS PCT: 51.2 %
Neutrophil #: 3.1 10*3/uL (ref 1.4–6.5)
Platelet: 360 10*3/uL (ref 150–440)
RBC: 3.72 10*6/uL — ABNORMAL LOW (ref 3.80–5.20)
RDW: 21.7 % — ABNORMAL HIGH (ref 11.5–14.5)
WBC: 6.1 10*3/uL (ref 3.6–11.0)

## 2013-12-06 LAB — MAGNESIUM: Magnesium: 1.6 mg/dL — ABNORMAL LOW

## 2013-12-06 LAB — BASIC METABOLIC PANEL
ANION GAP: 7 (ref 7–16)
BUN: 7 mg/dL (ref 7–18)
CALCIUM: 8 mg/dL — AB (ref 8.5–10.1)
CHLORIDE: 101 mmol/L (ref 98–107)
CREATININE: 0.62 mg/dL (ref 0.60–1.30)
Co2: 27 mmol/L (ref 21–32)
EGFR (African American): >60
Glucose: 88 mg/dL (ref 65–99)
Osmolality: 267 (ref 275–301)
Potassium: 3.5 mmol/L (ref 3.5–5.1)
SODIUM: 135 mmol/L — AB (ref 136–145)

## 2013-12-07 LAB — URINE CULTURE

## 2013-12-10 LAB — CULTURE, BLOOD (SINGLE)

## 2013-12-18 ENCOUNTER — Other Ambulatory Visit: Payer: Self-pay | Admitting: Ophthalmology

## 2013-12-18 LAB — SEDIMENTATION RATE: ERYTHROCYTE SED RATE: 42 mm/h — AB (ref 0–30)

## 2013-12-21 ENCOUNTER — Ambulatory Visit: Payer: Self-pay | Admitting: Ophthalmology

## 2014-02-09 DIAGNOSIS — D649 Anemia, unspecified: Secondary | ICD-10-CM | POA: Insufficient documentation

## 2014-02-12 ENCOUNTER — Ambulatory Visit: Payer: Self-pay | Admitting: Family Medicine

## 2014-04-21 ENCOUNTER — Ambulatory Visit: Payer: Self-pay

## 2014-08-06 DIAGNOSIS — I10 Essential (primary) hypertension: Secondary | ICD-10-CM | POA: Insufficient documentation

## 2014-12-25 NOTE — Consult Note (Signed)
Details:   - GI Note.  KUB shows free air today and she is going to the OR.     I was able to speak to Ms Penner and her family before the surgery about this unfortunate complication.   I will continue to follow.   Thank you for the excellent surgical and hospitalist care.   Electronic Signatures: Arther Dames (MD)  (Signed 29-Jan-15 17:39)  Authored: Details   Last Updated: 29-Jan-15 17:39 by Arther Dames (MD)

## 2014-12-25 NOTE — Op Note (Signed)
PATIENT NAME:  Allison Hampton, Allison Hampton MR#:  [id number] DATE OF BIRTH:  1928-04-06  DATE OF PROCEDURE:  10/29/2013  PREOPERATIVE DIAGNOSIS: Pelvic abscess.  POSTOPERATIVE DIAGNOSIS: Pelvic abscess.  PROCEDURES:  1. Ultrasound guidance for vascular access to right brachial vein.  2. Fluoroscopic guidance for placement of catheter.  3. Insertion of peripherally inserted double lumen, right arm.  SURGEON: Hortencia Pilar, MD  ANESTHESIA: Local.   ESTIMATED BLOOD LOSS: Minimal.   INDICATION FOR PROCEDURE: Requiring IV antibiotics greater than 5 days.  DESCRIPTION OF PROCEDURE: The patient's right arm was sterilely prepped and draped, and a sterile surgical field was created. The brachial vein was accessed under direct ultrasound guidance without difficulty with a micropuncture needle and permanent image was recorded. 0.018 wire was then placed into the superior vena cava. Peel-away sheath was placed over the wire. A single lumen peripherally inserted central venous catheter was then placed over the wire and the wire and peel-away sheath were removed. The catheter tip was placed into the superior vena cava and was secured at the skin at 32 cm with a sterile dressing. The catheter withdrew blood well and flushed easily with heparinized saline. The patient tolerated procedure well.  ____________________________ Katha Cabal, MD ggs:sb D: 11/06/2013 09:59:50 ET T: 11/06/2013 10:24:00 ET JOB#: [id number]  cc: Katha Cabal, MD, <Dictator> Katha Cabal MD ELECTRONICALLY SIGNED 11/24/2013 18:40

## 2014-12-25 NOTE — H&P (Signed)
PATIENT NAME:  Allison Hampton, ROUTZAHN MR#:  [id number] DATE OF BIRTH:  01-Jul-1928  DATE OF ADMISSION:  09/29/2013  PRIMARY CARE PHYSICIAN:  Dr. Baldemar Lenis.   REFERRING PHYSICIAN:  Dr. Robet Leu.   CHIEF COMPLAINT:  Right lower quadrant abdominal pain.   HISTORY OF PRESENT ILLNESS:  The patient is an 81 year old pleasant Caucasian female with a past medical history of chronic anemia, GERD, was evaluated by the GI physician Dr. Rayann Heman and got admitted to the hospital and had EGD, colonoscopy done today.  Following the procedure she was discharged home.  Three hours after she went home she started having right lower quadrant abdominal pain.  Denies any nausea or vomiting.  Denies any diarrhea.  She called GI doctor, Dr. Rayann Heman who has asked her to come to the ER.  In the ER, CAT scan of the abdomen and pelvis was done which has revealed a possible small bowel obstruction or enteritis.  The ER physician has called on-call surgeon, Dr. Rexene Edison regarding that who has reviewed the CAT scan and felt like this is not a true small bowel obstruction.  He thinks it is inflammation in the celiac area near the area  fulgurated.  He has recommended to admit the patient, to keep her nothing by mouth, provide IV fluids and do serial abdominal exams.  The patient was given IV clindamycin as she is ALLERGIC TO PENICILLIN AND CIPROFLOXACIN.  The patient also admitted that she is ALLERGIC TO FLAGYL.  SHE IS REPORTING ANAPHYLACTIC REACTION TO PENICILLIN.  In the ER, the patient was given IV clindamycin and gentamicin and hospitalist team is called to admit the patient as Dr. Rayann Heman has asked the hospitalist team to admit the patient.  During my examination, the patient is reporting that she did not pass any flatus, but had small bowel movements x 3 after she came into the ER.  Daughter is at bedside.  Denies any dizziness, nausea or vomiting.   PAST MEDICAL HISTORY:  Chronic anemia, GERD, hypertension, macular degeneration.   PAST  SURGICAL HISTORY:  Cholecystectomy, D and C.   ALLERGIES:  CIPROFLOXACIN, CODEINE, PENICILLIN, SULFA, SEAFOOD.   HOME MEDICATIONS: Valium 5 mg by mouth as needed, probiotic formula, Nexium 20 mg by mouth as needed, losartan 50 mg by mouth once daily, ibuprofen 200 mg as needed, aspirin 81 mg once daily.   PSYCHOSOCIAL HISTORY:  Lives alone.  She has been smoking for the past several years, more than 60 years and still continues to smoke.  She is not ready to quit smoking at this time.  Denies any alcohol or illicit drug usage.   FAMILY HISTORY:  Mother had coronary artery disease, status post CABG and aortic aneurysm.    REVIEW OF SYSTEMS:  CONSTITUTIONAL:  Denies any fever, fatigue.  EYES:  Denies blurry vision, double vision.  EARS, NOSE, THROAT:  Denies epistaxis or discharge.  RESPIRATION:  Denies cough, COPD.  CARDIOVASCULAR:  No chest pain or palpitations.  GASTROINTESTINAL:  Denies nausea, vomiting, diarrhea.  Complaining of right lower quadrant abdominal pain after EGD and colonoscopy done.  GENITOURINARY:  No dysuria, hematuria.  GYNECOLOGIC AND BREAST:  Denies breast mass or vaginal discharge.  ENDOCRINE:  Denies polyuria, nocturia, thyroid problems.  HEMATOLOGIC AND LYMPHATIC:  No anemia, easy bruising, bleeding.  INTEGUMENTARY:  No acne, rash, lesions.  MUSCULOSKELETAL:  No joint pain in neck and back.  Denies gout.  NEUROLOGIC:  No vertigo, ataxia.  PSYCHIATRIC:  No ADD, OCD.   PHYSICAL EXAMINATION: VITAL SIGNS:  Temperature 99.3, pulse 103, respirations 20, blood pressure 105/51, pulse ox 93%.  GENERAL APPEARANCE:  Not under acute distress. Moderately built and thin-looking female.  HEENT:  Normocephalic, atraumatic.  Pupils are equally reacting to light and accommodation.  No scleral icterus.  No conjunctival injection.  No sinus tenderness.  No postnasal drip.  Moist mucous membranes.  NECK:  Supple.  No JVD.  No thyromegaly.  LUNGS:  Clear to auscultation bilaterally.   No accessory muscle usage.  No anterior chest wall tenderness on palpation.  CARDIAC:  S1, S2 normal.  Regular rate and rhythm.  No murmurs.  GASTROINTESTINAL:  Soft.  Bowel sounds are hypoactive.  Right lower quadrant tenderness is present.  No rebound tenderness.  NEUROLOGIC:  Awake, alert, oriented x 3.  Cranial nerves II through XII are intact.  Motor and sensory are intact.  Reflexes are 2+.  EXTREMITIES:  No edema.  No cyanosis.  No clubbing.  SKIN:  Warm to touch.  Normal turgor.  No rashes.  No lesions.  MUSCULOSKELETAL:  No joint effusion, tenderness, erythema.  PSYCHIATRIC:  Normal mood and affect.   LABORATORY AND IMAGING STUDIES:  CAT scan of the abdomen and pelvis without contrast has revealed distention of the stomach with no free intraperitoneal air, distention of the proximal small bowel and decompressed distal small bowel loops, nonspecific, setting of recent endoscopy.  A small bowel obstruction or enteritis is not excluded.  Three view abdomen has revealed suspicious for subtle free air beneath the left hemidiaphragm.  Recommended CT.  LFTs are normal.  WBC 19.0, hemoglobin 9.3, hematocrit is 30.3, platelets are 357, MCV is 82.  Chem-8, BUN and creatinine are normal.  Sodium 133, potassium 3.7, anion gap is 6, calcium 8.3, glucose 184.   ASSESSMENT AND PLAN:  An 81 year old Caucasian female brought brought into the ER for right lower quadrant abdominal pain after she had EGD and colonoscopy done yesterday by Dr. Rayann Heman.  1.  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  Other differential could be small bowel obstruction.  Plan is to keep her nothing by mouth, provide IV fluids, pain management.  GI consult is placed and surgical consult is placed.  Surgery has recommended to keep her nothing by mouth and do serial abdominal exam and antibiotics.  The patient will be on IV aztreonam and  clindamycin.  Appreciate surgery's recommendations.  2.  Chronic history of anemia status post EGD and colonoscopy.  Colonoscopy has revealed non-thrombosed external hemorrhoids, diverticulosis in the sigmoid colon, transverse colon and descending colon.   3.  Angiodysplasia of intestine.  The patient will be on IV Protonix.  4.  Chronic history of macular degeneration.  5.  The patient will be on gastrointestinal prophylaxis.  6.  CODE STATUS:  SHE IS A FULL CODE.  Daughter is the medical power of attorney.  7.  Deep vein thrombosis prophylaxis with SCDs.   Plan of care discussed in detail with the patient.  She is aware of the plan.   Time spent is 45 minutes.   Plan of care was discussed in detail with the patient and her daughter.  They verbalized understanding of the plan.    ____________________________ Nicholes Mango, MD ag:ea D: 09/30/2013 01:48:22 ET T: 09/30/2013 02:29:22 ET JOB#: [id number]  cc: Nicholes Mango, MD, <Dictator> Arther Dames, MD Derinda Late, MD  Nicholes Mango MD ELECTRONICALLY SIGNED 10/11/2013 2:09

## 2014-12-25 NOTE — H&P (Signed)
PATIENT NAME:  Allison Hampton MR#:  [id number] DATE OF BIRTH:  [birth year]  DATE OF ADMISSION:  10/25/2013  PRIMARY CARE PHYSICIAN:  Dr. Baldemar Lenis.   ADMITTING PHYSICIAN:  Dr. Pat Patrick.   CHIEF COMPLAINT:  Fever and abdominal pain.   BRIEF HISTORY OF PRESENT ILLNESS:  Allison Hampton is an 81 year old woman seen in the Emergency Room with multiple medical problems.  The most pressing issue relates back to an incident that occurred three weeks ago when she suffered an iatrogenic colonic perforation during a colonoscopy with laser treatment of angiodysplastic lesion in the cecum.  She initially had evidence of pericecal fluid without evidence of free air or definitive rupture.  She was treated with antibiotic therapy and further investigation with follow-up films identified significant free air.  She underwent surgical exploration with the laparoscope.  A small perforation identified in the lateral wall of the cecum with a moderate amount of contamination in the abdominal cavity.  The site of the perforation was on the back wall of the cecum.  It made it very difficult to repair it with the laparoscope.  An appendiceal-like incision was made in the right lower quadrant.  The cecum elevated into the wound and a two layer closure performed with 3-0 Vicryl, closing the defect in an inversion of the defect with seromuscular sutures of 3-0 silk similar to inversion of an appendiceal stump.  She was treated with postoperative antibiotics.  Aggressive irrigation and lavage was performed at the time of surgery.  She had slow return of bowel function, but did seem to improve.  Her temperature returned to normal, was discharged home on antibiotic therapy.  She has been seen on several occasions in our office.  Immediately postoperative she demonstrated some drainage from her right lower quadrant incision which appeared to be a localized wound infection.  That problem resolved spontaneously without any sequelae.  She was seen  this past week with some crampy abdominal pain and constipation and she was prescribed a regimen for cathartics.  She has had continued crampy bilateral lower quadrant abdominal pain, worse on the right than on the left with poor bowel function.  She developed a fever yesterday with an episode of chill and sweating.  Today her daughter actually measured her temperature in the afternoon after profound shaking chills.  She was noted to be 102.  They brought her to the Emergency Room for further evaluation.   The ED evaluation revealed a white blood cell count of 18,000.  CT scan was performed which demonstrated an indistinct fluid collection in the right pelvis consistent with a developing abscess.  There did not appear to be any evidence of free air or breakdown of the colonic repair.  The surgical service was consulted.   PAST MEDICAL HISTORY:  She has history of chronic anemia, gastroesophageal reflux disease.  She has had a previous cholecystectomy and a D and C.  She is regularly followed by Dr. Baldemar Lenis at the [facility].   ALLERGIES:  INCLUDE AN ALLEGED ANAPHYLACTIC RESPONSE TO PENICILLIN AND THEN ALLERGIES TO CIPROFLOXACIN AND FLAGYL.   CURRENT MEDICATIONS:  Include aspirin 81 mg by mouth daily, losartan 50 mg by mouth daily, Nexium 20 mg by mouth daily, Probiotic once a day, Valium 5 mg by mouth daily as needed, Zyrtec 10 mg by mouth daily as needed.  She also takes iron 150 mg twice daily.   SOCIAL HISTORY:  She is a reformed cigarette smoker.   REVIEW OF SYSTEMS:  Otherwise unremarkable.  She denies any nausea or vomiting at the present time, although she has been intermittently nauseated over the last several weeks.    PHYSICAL EXAMINATION: VITAL SIGNS:  In the Emergency Room her blood pressure is within its normal limits at 123/60.  Heart rate is 105.  Temperature is 102.2.  Pain scale is a 7.  Oxygen saturation is 93% on room air.  HEENT:  Unremarkable.  She has no scleral icterus.   No pupillary abnormalities.  No facial deformities.  NECK:  Supple, nontender with no adenopathy.  Midline trachea.  Her breath sounds are quite distant, but she has no adventitious sounds and she has normal pulmonary excursion.  CARDIAC:  No murmurs or gallops to my ear and she seems to be in normal sinus rhythm.  ABDOMEN:  Generally soft.  The right lower quadrant wound looks good and there does not appear to be any drainage at the present time.  There is some mild thickness about that incision.  She has active bowel sounds.  No rebound or guarding.  No masses or hernias are noted.  EXTREMITIES:  Lower extremity exam reveals full range of motion, no deformities.  PSYCHIATRIC:  Normal orientation, normal affect.   DIAGNOSTIC STUDIES:  I have independently reviewed her CT scan and its report.  There is an indistinct fluid collection in the right pelvis consistent with a developing abscess.  Does not appear to be well-localized fluid collection.  There is no free air noted.  There does not appear to be any evidence of breakdown of the colonic repair.   ASSESSMENT AND PLAN:  With her fever, white blood cell count, CT findings we are going to treat her as a pelvic abscess following surgical repair of her injury.  We will put her on antibiotic therapy and get the medical doctors to assist in management of her medical problems, alert the GI service of her readmission.  She does have a history of hyponatremia which has been a problem in the past.  She has been followed by the renal service, actually seeing them earlier this week.  Her sodium was normal at that time, although it is down to 125 at the present time.  We will put her on normal saline now to see if we can get those laboratory abnormalities to resolve.  She is in agreement with this plan.  It has been discussed with both the patient and her daughter.     ____________________________ [name], MD rle:ea D: 10/25/2013 23:03:36  ET T: 10/26/2013 02:27:34 ET JOB#: [id number]  cc: [name], MD, <Dictator> Arther Dames, MD Derinda Late, MD Rodena Goldmann MD ELECTRONICALLY SIGNED 11/04/2013 7:38

## 2014-12-25 NOTE — Consult Note (Signed)
Chief Complaint:  Subjective/Chief Complaint The patient denies passing anu gas and has not had a bowel movement. She also says she is burping a lot.   VITAL SIGNS/ANCILLARY NOTES: **Vital Signs.:   31-Jan-15 09:50  Vital Signs Type Q 4hr  Temperature Temperature (F) 97.6  Celsius 36.4  Temperature Source oral  Pulse Pulse 87  Respirations Respirations 18  Systolic BP Systolic BP 537  Diastolic BP (mmHg) Diastolic BP (mmHg) 78  Mean BP 91  Pulse Ox % Pulse Ox % 95  Pulse Ox Activity Level  At rest  Oxygen Delivery Room Air/ 21 %  Pulse Ox Heart Rate 93  Telemetry pattern Cardiac Rhythm Normal sinus rhythm; PAC's   Brief Assessment:  GEN well nourished, no acute distress   Gastrointestinal details normal Soft  Distended. Decreased bowel sounds.   Lab Results: Routine Chem:  31-Jan-15 04:38   Result Comment LABS - This specimen was collected through an   - indwelling catheter or arterial line.  - A minimum of 50ms of blood was wasted prior    - to collecting the sample.  Interpret  - results with caution.  Result(s) reported on 03 Oct 2013 at 05:07AM.  Glucose, Serum  201  BUN 8  Creatinine (comp) 0.80  Sodium, Serum  121  Potassium, Serum 4.2  Chloride, Serum  92  CO2, Serum 24  Calcium (Total), Serum  7.3  Anion Gap  5  Osmolality (calc) 248  eGFR (African American) >60  eGFR (Non-African American) >60 (eGFR values <639mmin/1.73 m2 may be an indication of chronic kidney disease (CKD). Calculated eGFR is useful in patients with stable renal function. The eGFR calculation will not be reliable in acutely ill patients when serum creatinine is changing rapidly. It is not useful in  patients on dialysis. The eGFR calculation may not be applicable to patients at the low and high extremes of body sizes, pregnant women, and vegetarians.)  Routine Hem:  31-Jan-15 04:38   WBC (CBC)  14.8  RBC (CBC)  3.00  Hemoglobin (CBC)  7.6  Hematocrit (CBC)  24.5  Platelet  Count (CBC) 225  MCV 82  MCH  25.4  MCHC  31.2  RDW  20.7  Neutrophil % 90.5  Lymphocyte % 4.2  Monocyte % 4.8  Eosinophil % 0.2  Basophil % 0.3  Neutrophil #  13.4  Lymphocyte #  0.6  Monocyte # 0.7  Eosinophil # 0.0  Basophil # 0.0   Assessment/Plan:  Assessment/Plan:  Assessment Patient s/p cecal perforation after APC with surgical repair. Now with hyponatremia.   Plan Patient feeling better. Likely some ileus post OP as the cause of distention and lack of passing gas and bowel movements. Has been told to ambulate.   Electronic Signatures: WoLucilla LameMD)  (Signed 31-Jan-15 10:11)  Authored: Chief Complaint, VITAL SIGNS/ANCILLARY NOTES, Brief Assessment, Lab Results, Assessment/Plan   Last Updated: 31-Jan-15 10:11 by WoLucilla LameMD)

## 2014-12-25 NOTE — Consult Note (Signed)
I have seen and examined Allison Hampton and agree with Christiane London's a/p.  suspect some of the right sided pain is related to thermal injury from the APC. ( one of the AVM was very large).  also has a distended abd and some of the discomfort may be from gas trapped behind the severe divertiulosis.  she is much better today and will likely continue to improve.    7 day course of abx would be reasonable since this is essentially the same as a post-polypectomy syndrome.   care from hospitalists and surgery. cont to follow.    Electronic Signatures: Arther Dames (MD)  (Signed on 28-Jan-15 18:27)  Authored  Last Updated: 28-Jan-15 18:27 by Arther Dames (MD)

## 2014-12-25 NOTE — Consult Note (Signed)
Details:   - GI Note:  Feels well today.  Passed stool.  Also passing gas. ABd distension imroved.  Tolerating PO.   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  - I will schedule cbc for 3 weeks from now - f/u with Dr Pat Patrick in 1 week with cbc at that time.   2.) Cecal peforation due to apc, s/p repair: doing well, moving bowels, tolerating PO.  - appreciate excellent surgical and hospitalist care.   Electronic Signatures: Arther Dames (MD)  (Signed 02-Feb-15 17:01)  Authored: Details   Last Updated: 02-Feb-15 17:01 by Arther Dames (MD)

## 2014-12-25 NOTE — Op Note (Signed)
PATIENT NAME:  Allison Hampton, Allison Hampton MR#:  [id number] DATE OF BIRTH:  02/04/1928  DATE OF PROCEDURE:  10/01/2013  PREOPERATIVE DIAGNOSIS:  Colonic for perforation.   POSTOPERATIVE DIAGNOSIS:  Colonic for perforation.   OPERATION:  Laparoscopy, minilaparotomy, and colonic repair.   SURGEON:  Rodena Goldmann III, MD  ANESTHESIA:  General.   OPERATIVE PROCEDURE:  With the patient in the supine position after induction of appropriate general anesthesia, the patient's abdomen was prepped with ChloraPrep and draped with sterile towels. An alcohol wipe and Betadine impregnated Steri-Drape were utilized. The patient was placed in the head down, feet up position. A small infraumbilical incision was made in the standard fashion, carried down bluntly through the subcutaneous tissue. The Veress needle was used to cannulate the peritoneal cavity. CO2 was insufflated to appropriate pressure measurements. When approximately 2.5 L CO2 were instilled, the Veress needle was withdrawn. An 11-mm [employer] port was inserted in the peritoneal cavity. The port position was confirmed, and CO2 was reinsufflated. Examining the abdomen, there was clear evidence of inflammatory change and some free fecal material in the right lower quadrant. Left lower quadrant and right upper quadrant incisions were made, and other ports were placed under direct vision for other instruments. The area was suctioned and irrigated. Careful evaluation of the back wall of the cecum did demonstrate approximately a 1-cm defect with stool leaking from the defect. I was very concerned about the possibility of an inadequate repair laparoscopically because the location of the defect was on the back wall of the cecum. I carefully examined the anterior abdominal wall to identify where the cecum lay. We then converted to an open procedure with a McBurney's incision carried down through the subcutaneous tissue with Bovie electrocautery. The fascia was identified  and opened the length of the skin incision. The muscle group was divided, and the epigastric vessels individually ligated with 3-0 Vicryl. The peritoneum was opened, and the abdomen desufflated. The cecum was easily elevated into the incision. The edges of the defect were cleaned, and inverted using 3-0 Vicryl in interrupted fashion. Then, a 5-cm area of cecum was imbricated with a seromuscular suture of 3-0 silk to further seal the leak. The repair appeared to be satisfactory.   The area was copiously irrigated. The bowel was returned to the anatomic position. The omentum could not be brought down to the right lower quadrant because it was adherent to the anterior abdominal wall and the site of her previous gallbladder surgery. The posterior fascia was closed with a running suture of 0 Vicryl. The anterior fascia was closed with interrupted figure-of-eight suture of 0 Maxon. The subcutaneous fascia was closed with 3-0 Vicryl, and the skin was clipped. The abdomen was then reinsufflated. The right lower quadrant was examined. The repair was visualized and photographed. The area was copiously irrigated with 3 L of warm saline solution. The abdomen was then desufflated. Skin incisions were clipped. A right internal jugular central line was attempted under ultrasound guidance, and the vein was easily cannulated. However, a wire would not pass. Attention was then turned to the left groin where a left groin central venous catheter was inserted under direct ultrasound guidance without difficulty. The catheter was flushed and secured. The patient returned to the recovery room, having tolerated the procedure well. Sponge, instrument, and needle counts were correct x2 in the operating room.    ____________________________ Rodena Goldmann III, MD rle:ms D: 10/01/2013 19:47:05 ET T: 10/02/2013 00:00:31 ET JOB#: [id number]  cc: [name], MD, <Dictator> Arther Dames, MD Rodena Goldmann MD ELECTRONICALLY SIGNED  10/02/2013 18:09

## 2014-12-25 NOTE — Discharge Summary (Signed)
PATIENT NAME:  Allison Hampton, Allison Hampton MR#:  [id number] DATE OF BIRTH:  07/18/1928  DATE OF ADMISSION:  09/29/2013 DATE OF DISCHARGE:  10/05/2013  BRIEF HISTORY: Allison Hampton is an 81 year old woman admitted to the hospital on the evening of January 27. She had undergone a colonoscopy earlier in the day performed by the GI service. 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 CT scan demonstrated some inflammatory changes around the cecum suggestive of possible bowel injury. No free air was noted. Her white blood cell count was elevated to 19,000. Her hemoglobin was 9 grams. Electrolytes were unremarkable except for a slightly depressed sodium at 133. Medical service admitted the patient for further evaluation. Surgical service consulted.   The following day she had much less abdominal discomfort. She does not have any significant right lower quadrant tenderness. She was taking a liquid diet. We elected to repeat her films later that day. However, plain films done on the early morning of the 28th did demonstrate evidence of free air, which suggested free perforation. Her abdominal symptoms had increased, white count remained elevated. She was taken urgently to the Operating Room on the afternoon of 10/01/2013. Surgery was performed laparoscopically initially but the perforation was identified in the back side of her cecum could not be reached adequately through the laparoscope. A small right lower quadrant incision similar to an appendectomy incision was performed which allowed excellent exposure to the defect in the cecal wall. A two layer closure was accomplished. The abdomen was then irrigated with multiple liters of warm saline solution. She had very slow return of bowel function and white blood cell count came down without difficulty. She had markedly depressed sodium at 120. She was getting  normal saline at that time and she did have normal urine sodium. She was felt to be possibly over hydrated. She was seen by the renal service and the hyponatremia was felt to be iatrogenic. Discharged home on 2nd to be followed in the office in 7 to 10 days' time. Bathing, activity and driving instructions were given the patient.   DISCHARGE MEDICATIONS: She is to resume her home medications which include losartan 50 mg once a day, aspirin 81 mg once a day, Zyrtec 10 mg as needed, probiotic 1 capsule once a day, Nexium 20 mg once a day. Valium 5 mg once a day p.r.n., iron 1 capsule 2 times a day. She was discharged home on ibuprofen or Tylenol for pain.   FINAL DISCHARGE DIAGNOSIS: Iatrogenic perforation cecum.   SURGERY: Closure of perforation cecum.    ____________________________ Rodena Goldmann III, MD rle:sg D: 10/12/2013 23:24:04 ET T: 10/13/2013 06:51:31 ET JOB#: [id number]  cc: [name], MD, <Dictator> Arther Dames, MD Derinda Late, MD  Rodena Goldmann MD ELECTRONICALLY SIGNED 10/14/2013 1:09

## 2014-12-25 NOTE — Consult Note (Signed)
Brief Consult Note: Diagnosis: abdominal pain.   Patient was seen by consultant.   Consult note dictated.   Comments: Appreciate consult for 81 y/o caucasian woman admitted after colonoscopy/egd yesterday after onset of severe RLQ pain last night, that spread across her abdomen. States she is feeling much better today although the rlq is somewhat persistent. Has not passed any gas or had any bowel movments. Feels bloated. Denies NV and further GI complaints.   During colonoscopy she was treated for cecal avms by fulguration. There was a question of free air on abdominal xray last night but this was not found on CT scan. There was however concern of enteritis v. SBO. Surgery has been consulted and she was started on several antibiotics.  No repeat abdominal xray has been done today yet.  EGD revealed some gastritis and esophagitis: a KOH prep was done and positive for yeast- has not been treated for this as of yet. Impression and plan: Continue present. Flat and erect abd film. Feeling better. Appreciate surgical opinion. Spoke with Dr Marthann Schiller and he has already ordered Diflucan for the candidal esophagitis..  Electronic Signatures: Stephens November H (NP)  (Signed 28-Jan-15 15:27)  Authored: Brief Consult Note   Last Updated: 28-Jan-15 15:27 by Theodore Demark (NP)

## 2014-12-25 NOTE — Discharge Summary (Signed)
PATIENT NAME:  Allison Hampton, Allison Hampton MR#:  [id number] DATE OF BIRTH:  [birth year]  DATE OF ADMISSION:  12/05/2013 DATE OF DISCHARGE:  12/08/2013  PRIMARY CARE PHYSICIAN:  Dr. Baldemar Lenis.  FINAL DIAGNOSES: 1.  Clinical sepsis with pelvic fluid collection.  2.  Hypertension.  3.  Anemia.  4.  Vaginal bleeding.   MEDICATIONS ON DISCHARGE: Include losartan 50 mg at bedtime, probiotic formula 1 capsule daily, Nexium 20 mg twice a day, Valium 5 mg daily as needed, iron polysaccharide 150 mg twice a day, MiraLAX 17 grams once a day as needed for constipation, Cleocin HCl 300 mg 1 capsule every 8 hours for 2-week course and follow up with Dr. Pat Patrick. Stop taking aspirin.  DIET: Low sodium diet, regular consistency.  FOLLOWUP: With Dr. Pat Patrick Surgical in 1 to 2 weeks.  Keep followup appointment with Dr. Baldemar Lenis.   ACTIVITY: As tolerated.   HOSPITAL COURSE: The patient was admitted 12/05/2013 and discharged 12/08/2013. Came in with abdominal pain, vaginal bleeding. The patient was placed on IV Invanz. CT scan showed a possible abscess fluid collection.  CONSULTANTS DURING THE HOSPITAL COURSE:  Included Dr. Marina Gravel and Dr. Pat Patrick from surgery, Dr. Marcelline Mates from gynecology.  LABORATORY AND RADIOLOGICAL DATA DURING THE HOSPITAL COURSE: Included a EKG that showed a sinus rhythm, premature supraventricular complexes. INR 1.2, magnesium 1.9, lipase 97, glucose 117, BUN 11, creatinine 0.72. Sodium 133, potassium 3.5, chloride 97, CO2 of 30, calcium 8.6. Liver function tests normal range. White blood cell count 13.1, H and H 9.7 and 30.3, platelet count 358. Urine culture grew out only 15,000 Escherichia coli. I would not even call this a UTI. Urinalysis: Leukocyte esterase, 3+ bacteria, 3+ blood. Blood cultures negative.  Ultrasound pelvis showed a solid complex-appearing mass 4.4 x 4.3 cm posterior to the uterus. Difficult to distinguish from the uterus. Malignancy cannot be excluded. Large fibroid could have a similar  appearance.  MRI of the pelvis showed possible complex hemorrhagic mass involving the posterior aspect of the uterus. Findings worrisome for uterine leiomyosarcoma. Hemoglobin upon discharge 9.6, white blood cell count 6.1, platelet count of 360, creatinine 0.62.  HOSPITAL COURSE PER PROBLEM LIST:  1.  Clinical sepsis with pelvic fluid collection. The patient was started on IV Invanz, seen in consultation by surgery and gynecology. This was suspected from a fluid collection from previous colonoscopy and perforation. This will be watched as outpatient, may end up needing surgical intervention in the future but the patient is clinically stable at this point. They will follow closely as outpatient through [facility], likely will need a repeat CAT scan. I will give 2 weeks of clindamycin at this point. If surgery wants a further antibiotic course, can do that in a followup appointment. 2.  Hypertension, on losartan.  3.  Anemia, on iron.  4.  Vaginal bleeding, may be more serous fluid rather than vaginal bleeding. Seen in consultation by gynecology/oncology. They do not believe that this is a leiomyosarcoma and recommended repeat imaging in the future.   The patient discharged home in stable condition.  TIME SPENT ON DISCHARGE:  35 minutes.   ____________________________ Tana Conch. Leslye Peer, MD rjw:ce D: 12/09/2013 15:31:37 ET T: 12/09/2013 18:12:17 ET JOB#: [id number]  cc: Tana Conch. Leslye Peer, MD, <Dictator> Derinda Late, MD [place] MD ELECTRONICALLY SIGNED 12/12/2013 15:53

## 2014-12-25 NOTE — H&P (Signed)
PATIENT NAME:  Allison Hampton, Allison Hampton MR#:  [id number] DATE OF BIRTH:  Mar 29, 1928  DATE OF ADMISSION:  12/05/2013  PRIMARY CARE PHYSICIAN: Derinda Late, MD  REFERRING PHYSICIAN: Yetta Numbers. Karma Greaser, MD  PRIMARY SURGERY: [facility].  PRIMARY GASTROINTESTINAL: Arther Dames, MD   CHIEF COMPLAINT: Abdominal pain, vaginal bleeding.   HISTORY OF PRESENT ILLNESS: The patient is a pleasant 81 year old female with a complicated recent history. She initially came into the hospital on the 27th of January after experiencing abdominal pain after a colonoscopy. She was noted to have a perforation, went for urgent surgical repair on the 29th, was discharged. She came back in February for abdominal pain and was noted to have a possible pelvic infection. Was admitted to surgery and was discharged on the 27th of February with Invanz via PICC line. She took 9 days of the antibiotic. She states that she never completely felt well, had persistent vague abdominal pains. She had been following with her primary care physician and Dr. Rayann Heman, her GI physician. She also had some vaginal discharge and was seen by staff of Dr. Enzo Bi from OB/GYN the last couple of weeks. She came in after experiencing vaginal bleed overnight, which was new for her. She stated that she had changed up to 6 pads for ongoing bleeding. The last couple of nights, she has been having polyuria and nocturia without dysuria. She has had low-grade fevers, and she came into the hospital. Here, she was noted to have mild leukocytosis, tachycardia, and an ultrasound of pelvis transvaginally was done. She was evaluated by ER physician and had a couple of pelvic exams, and I was told by Dr. Karma Greaser that there was no acute bleeding at this time. The case had been discussed with GYN after the ultrasound of the pelvis found a 6 by close to 4 x 3.5 cm complex solid-appearing mass posteriorly to the uterus. There was some associated blood flow. There was a concern  for possible malignancy. OB/GYN, per notes here, had recommended transfer to tertiary care center, and when the ER physician attempted to transfer the patient to [place] [facility], they stated that this is more of a nonurgent issue and to admit the patient here. Hospitalist services were contacted for further evaluation and management at that time. She has no active bleeding now. She is accompanied by her daughter. She still has mild abdominal pain. She was started on meropenem, and blood cultures have been sent.   PAST MEDICAL HISTORY: History of:  1. Chronic anemia.  2. GERD.  3. Hypertension.  4. Macular degeneration.  5. History of iatrogenic colonic perforation after colonoscopy, status post laser cauterization, with complication of fluid collection, possible infection afterwards.   SURGICAL HISTORY:  1. Cholecystectomy.  2. D and C.  3. Abdominal surgery for colonic perforation earlier this year.   ALLERGIES: CIPRO, CODEINE, FLAGYL, IODINATED RADIOCONTRAST DYES, PENICILLIN, SULFA, SEAFOOD.   SOCIAL HISTORY: Does smoke, has been smoking for 50 years. No alcohol or drug use.   FAMILY HISTORY: Mom with CAD and aortic aneurysm.   OUTPATIENT MEDICATIONS: We do not have an official medication reconciliation yet, but she had been on Tylenol up to 8 per day for her abdominal pain with some Aleve intermittently. She was on iron twice a day and was told to decrease it to once a day. She is on losartan 50 mg at bedtime, Nexium twice daily, Valium p.r.n. 5 mg and probiotics.   REVIEW OF SYSTEMS:  CONSTITUTIONAL: About 10-pound weight loss  in the last couple of months, low-grade fevers, fatigue.  EYES: No blurry vision or double vision.  ENT: No tinnitus or hearing loss.  RESPIRATORY: No cough, wheezing, shortness of breath or dyspnea on exertion.  CARDIOVASCULAR: No chest pain, palpitations or orthopnea.  GASTROINTESTINAL: No nausea, vomiting or diarrhea. Positive for abdominal pain, nonradiating,  more generalized. No bloody stools or melena. URINARY: No dysuria, but some polyuria and nocturia in the last couple of nights. No hematuria.  HEMATOLOGIC AND LYMPHATIC: No easy bruising, but does have chronic anemia.  SKIN: No new rashes or lesions.  MUSCULOSKELETAL: Denies arthritis or gout.  NEUROLOGIC: No focal weakness or numbness.  PSYCHIATRIC: Denies anxiety or insomnia.   PHYSICAL EXAMINATION:  VITAL SIGNS: Temperature on arrival 98.9, initial pulse rate 101, respiratory rate 20, blood pressure 161/94, O2 saturation 94%.  GENERAL: The patient is a pleasant female, sitting in bed, in no obvious distress, talking in full sentences.  HEENT: Normocephalic, atraumatic. Pupils are equal. Extraocular muscles intact. Moist mucous membranes.  NECK: Supple. No thyroid tenderness. No cervical lymphadenopathy.  CARDIOVASCULAR: S1, S2 regular. No significant murmurs, rubs or gallops.  LUNGS: Clear to auscultation without wheezing, rhonchi or rales.  ABDOMEN: Soft. Mild generalized tenderness. No rebound or guarding. Positive bowel sounds in all quadrants. There is a healed right lower abdomen surgical scar, without significant drainage or surrounding cellulitis.  EXTREMITIES: No pitting edema.  SKIN: No obvious rashes.  NEUROLOGIC: Cranial nerves II through XII grossly intact. Strength is 5 out of 5 in all extremities. Sensation is intact to light touch. PSYCHIATRIC: Awake, alert and oriented x3. Pleasant and cooperative.  LABORATORIES AND IMAGING: Glucose 117, BUN 11, creatinine 0.72, sodium 133, potassium 3.7, magnesium is 1.9, lipase is 97. LFTs showed albumin of 2.5, otherwise within normal limits. White count of 13.1, hemoglobin 9.7, platelets 358. UA shows 3+ blood, 3+ leukocyte esterase, 270 RBC, 2432 WBC and trace bacteria, with some WBC clumps. EKG: Sinus with some PVCs. No acute ST elevations or depressions.   ASSESSMENT AND PLAN: We have a pleasant 81 year old female with admission on  the 27th of January for abdominal pain which was secondary to perforation, and she underwent surgical repair on the 29th of January, who was subsequently hospitalized under surgical service in late February and got discharged on February 27 for a fluid collection in the right pelvis with a question of developing abscess and was discharged with a PICC line and IV Invanz, who presents with persistent abdominal pain, fevers, some nocturia and polyuria and overnight bout of vaginal bleed. The patient does have sepsis per criteria with tachycardia, leukocytosis and urinary tract infection. There is also a possibility that this pelvic findings seen on the ultrasound might be a reaccumulation/abscess formation postsurgery. The patient also has vaginal bleed. At this point, she will be admitted to the hospitalist service. As she has tolerated Invanz prior, we would start her on Invanz, obtain surgical consult. Blood cultures and urine cultures have been sent. She has been started on IV fluids. I do not know if the vaginal bleed is related to the pelvic findings of the ultrasound, but it could be. There is also a possibility of a malignancy, but I suspect that is less on the differential as the patient did have a CAT scan done in February on the 25th with a 10 x 5 cm loculated pelvic fluid collection. SHE IS ALLERGIC TO IV CONTRAST, and I would see if I can obtain an MRI for further evaluation of  this pelvic finding. It is appearing suspicious for possible abscess as abdomen generally is tender as well. Would see what surgery has to say, and perhaps, if this is an abscess, a CT-guided or ultrasound-guided drainage might be possible. In regards to the vaginal bleed, the vaginal bleed should be investigated further. At this point, her hemoglobin is higher than the previous hemoglobin from last admission. We would monitor her hemoglobins. I would start her on SCDs and TEDs for deep vein thrombosis prophylaxis. Follow with the  urine cultures and make her n.p.o. just in case if the MRI shows findings concerning for abscess and she might need a surgical procedure at this point.   CODE STATUS: The patient is full code.   TOTAL TIME SPENT: 60 minutes.    ____________________________ Vivien Presto, MD sa:lb D: 12/05/2013 09:48:49 ET T: 12/05/2013 10:12:41 ET JOB#: [id number]  cc: Vivien Presto, MD, <Dictator> Arther Dames, MD Derinda Late, MD Vivien Presto MD ELECTRONICALLY SIGNED 12/31/2013 14:22

## 2014-12-25 NOTE — Consult Note (Signed)
Details:   - GI follow up.   S: Tolerating clear liquids.  Some abd pain near surgical site. NO n/v, f/c.   O: a and o x 4, nad cta ,no w/c reg, no m/r/g c/d/i, decreased bowel sounds, mild tenderness  Appreciate excellent surgical and hospitalist care.   Electronic Signatures: Arther Dames (MD)  (Signed 30-Jan-15 21:45)  Authored: Details   Last Updated: 30-Jan-15 21:45 by Arther Dames (MD)

## 2014-12-25 NOTE — Discharge Summary (Signed)
PATIENT NAME:  Allison Hampton, Allison Hampton MR#:  [id number] DATE OF BIRTH:  07/13/1928  DATE OF ADMISSION:  10/25/2013 DATE OF DISCHARGE:  10/30/2013  BRIEF HISTORY: Ms Allison Hampton is an 81 year old woman seen in the Emergency Room with abdominal pain. She had had an iatrogenic perforation of her right colon during a colonoscopy with laser treatment of angiodysplastic lesion of the cecum. She underwent surgical intervention for a 2 layer repair with inversion of the injured area. She had some mild postoperative fever and abdominal pain but appeared to improve over the next several days, discharged home on antibiotics. She began to improve almost immediately. However prior to this current episode, she began to develop some abdominal pain and fever approximately 12 to 24 hours prior to being evaluated in the Emergency Room. Repeat CT scan demonstrated an indistinct fluid collection of the right pelvis with a question of a developing abscess. There was no free air or evidence of colonic breakdown. She continued to improve over the next couple of days. White blood cell count came down and her fever improved. She had a PICC line placed. She was discharged home on IV antibiotics as it did not appear to be necessary to plan a repeat percutaneous or open intervention. She is discharged on the 27th and followed in the office in 7 to 10 days' time.   DISCHARGE MEDICATIONS:  Discharged home on Invanz 1 gram every day intravenously, losartan 50 mg p.o. at bedtime, aspirin 81 mg p.o. daily, probiotic 1 capsule every day, Nexium 20 mg b.i.d., Valium 5 mg p.o. q.6h. p.r.n., iron 150 mg capsule twice a day, dicyclomine 10 mg every 8 hours.   FINAL DISCHARGE DIAGNOSIS: Postoperative pelvic infection.   ____________________________ [name], MD rle:ST D: 11/06/2013 13:06:46 ET T: 11/06/2013 16:09:51 ET JOB#: [id number]  cc: [name], MD, <Dictator> Derinda Late, MD Arther Dames, MD  Rodena Goldmann  MD ELECTRONICALLY SIGNED 11/08/2013 18:27

## 2014-12-25 NOTE — Consult Note (Signed)
Brief Consult Note: Diagnosis: Intraabdominal abscess.   Patient was seen by consultant.   Consult note dictated.   Recommend to proceed with surgery or procedure.   Orders entered.   Discussed with Attending MD.   Comments: Intraabdominal abscess -   Cont ertapenem 1 gm iv qd Unlikley abx alone will cure this but pt prefers to build her strength prior to other procedure. Agree with reimage in 10-14 days and if no better then aspirate and leave drain in place If worsens proceed with aspiration.  Electronic Signatures: Angelena Form (MD)  (Signed 27-Feb-15 14:48)  Authored: Brief Consult Note   Last Updated: 27-Feb-15 14:48 by Angelena Form (MD)

## 2014-12-25 NOTE — Consult Note (Signed)
PATIENT NAME:  Allison Hampton, Allison Hampton MR#:  [id number] DATE OF BIRTH:  March 09, 1928  DATE OF CONSULTATION:  09/29/2013  REFERRING PHYSICIAN:   CONSULTING PHYSICIAN:  Harrell Gave A. Jeannia Tatro, MD  REASON FOR CONSULTATION:  Right lower quadrant pain following a colonoscopy initially for dilated loops of bowel concerning for a small bowel obstruction.   HISTORY OF PRESENT ILLNESS:  Allison Hampton is a pleasant 81 year old female who has a history of chronic iron deficiency anemia who had underwent multiple colonoscopies in the past with fulguration of cecal angiectasias who presents following colonoscopy.  Her colonoscopy showed some mild gastric and duodenal erythema as well as some lesions of her esophageal mucosa as well as multiple diverticulitis and nonbleeding cecal angiectasias which were fulgurated.  According to her she had went home from procedure and developed right lower quadrant pain as well as some nausea as well as some hiccuping.  She says that her pain has improved quite a bit, but is still present.  No fevers, chills, night sweats, shortness of breath, cough, chest pain, dysuria or hematuria.  Has been able to pass gas.   PAST MEDICAL HISTORY: 1.  History of iron deficient anemia status post multiple EGDs and colonoscopies.  2.  History of cholecystectomy.   HOME MEDICATIONS:   1.  Zyrtec.  2.  Valium.  3.  Probiotic.  4.  PreserVision antioxidant.  5.  Nexium.  6.  Losartan. 7.  Ibuprofen.  8.  Centrum.  9.  Aspirin.   ALLERGIES:   1.  PENICILLIN - ANAPHYLAXIS.  2.  SEAFOOD - ANAPHYLAXIS.  3.  SULFA - HIVES.  4.  CODEINE - OTHER.  5.  CIPRO, UNKNOWN ALLERGY.   SOCIAL HISTORY:  Is a current smoker.  Denies significant alcohol use.   REVIEW OF SYSTEMS:  A 12 point review of systems obtained.  Pertinent positives and negatives as above.   PHYSICAL EXAMINATION: VITAL SIGNS:  Temperature 98.1, pulse 102, blood pressure 140/60, respirations 20, 98% on room air.  GENERAL:  No  acute distress.  Alert and oriented x 3.  HEAD:  Normocephalic, atraumatic.  EYES:  No scleral icterus, no conjunctivitis.  FACE:  No obvious facial trauma.  Normal external nose.  Normal external ears.  CHEST:  Lungs clear to auscultation.  Moving air well.  HEART:  Regular rate and rhythm.  No murmurs, rubs, or gallops.  ABDOMEN:  Soft, mildly tender to left abdomen, more so right upper quadrant quite tender in right lower quadrant.  EXTREMITIES:  Moves all extremities well.  Strength 5 out of 5.  NEUROLOGIC:  Cranial nerves II through XII grossly intact.   LABORATORY DATA:  Significant for a white cell count of 19.0, hemoglobin 9.3, up from 7.1 following 1 unit of transfusion, platelets 357.   Glucose 184.   CT scan shows mildly distended stomach and proximal bowel loops with less defined, distal does show some fat stranding around her cecum.   ASSESSMENT AND PLAN:  Allison Hampton is a pleasant 81 year old female who presents with right lower quadrant pain following colonoscopy and fulguration of cecal angiectasias.  There is some mild stranding around the cecum and leukocytosis and is tachycardic, tender on exam, concern for a possible thermal injury to her cecum, but no obvious intraperitoneal free air.  No retroperitoneal free air or retroperitoneal fluid collection.  Would recommend admission for antibiotics, serial exam and resuscitation.  We will keep nothing by mouth for now.  If exam becomes worse and it does not  resolve with antibiotics may require surgical exploration.  We will continue to follow closely.    ____________________________ Glena Norfolk Zenab Gronewold, MD cal:ea D: 09/29/2013 23:56:28 ET T: 09/30/2013 01:31:30 ET JOB#: [id number]  cc: Harrell Gave A. Anthonette Lesage, MD, <Dictator> Floyde Parkins MD ELECTRONICALLY SIGNED 10/05/2013 8:29

## 2014-12-25 NOTE — Consult Note (Signed)
Chief Complaint:  Subjective/Chief Complaint The patient is doing better today. passing gas. Reports that she had a lot of stomach rumbling last night. No nausea.   VITAL SIGNS/ANCILLARY NOTES: **Vital Signs.:   01-Feb-15 06:23  Vital Signs Type Routine  Temperature Temperature (F) 99.1  Celsius 37.2  Pulse Pulse 91  Respirations Respirations 18  Systolic BP Systolic BP 035  Diastolic BP (mmHg) Diastolic BP (mmHg) 70  Mean BP 88  Pulse Ox % Pulse Ox % 93  Pulse Ox Activity Level  At rest  Oxygen Delivery Room Air/ 21 %  *Intake and Output.:   Daily 01-Feb-15 07:00  Grand Totals Intake:  350 Output:  1200    Net:  -850 24 Hr.:  -850  IV (Primary)      In:  250  IV (Secondary)      In:  100  Urine ml     Out:  1200  Length of Stay Totals Intake:  9772.82 Output:  4475    Net:  5297.82   Brief Assessment:  GEN well developed, well nourished, no acute distress   Respiratory normal resp effort   Gastrointestinal details normal Soft  Nontender   Additional Physical Exam Alert and orientated times 3   Lab Results: Routine Chem:  01-Feb-15 04:19   Glucose, Serum  104  BUN  6  Creatinine (comp) 0.71  Sodium, Serum  123  Potassium, Serum 3.9  Chloride, Serum  90  CO2, Serum 24  Calcium (Total), Serum  7.4  Anion Gap 9  Osmolality (calc) 246  eGFR (African American) >60  eGFR (Non-African American) >60 (eGFR values <11m/min/1.73 m2 may be an indication of chronic kidney disease (CKD). Calculated eGFR is useful in patients with stable renal function. The eGFR calculation will not be reliable in acutely ill patients when serum creatinine is changing rapidly. It is not useful in  patients on dialysis. The eGFR calculation may not be applicable to patients at the low and high extremes of body sizes, pregnant women, and vegetarians.)  Result Comment LABS - This specimen was collected through an   - indwelling catheter or arterial line.  - A minimum of 565m of blood  was wasted prior    - to collecting the sample.  Interpret  - results with caution.  Result(s) reported on 04 Oct 2013 at 05:03AM.  Routine Hem:  01-Feb-15 04:19   WBC (CBC) 9.8  RBC (CBC)  2.90  Hemoglobin (CBC)  7.7  Hematocrit (CBC)  23.1  Platelet Count (CBC) 225  MCV 80  MCH 26.6  MCHC 33.5  RDW  19.9  Neutrophil % 81.9  Lymphocyte % 9.1  Monocyte % 8.3  Eosinophil % 0.4  Basophil % 0.3  Neutrophil #  8.0  Lymphocyte #  0.9  Monocyte # 0.8  Eosinophil # 0.0  Basophil # 0.0 (Result(s) reported on 04 Oct 2013 at 04:55AM.)   Assessment/Plan:  Assessment/Plan:  Assessment Cecal perf. with surgical repair. Doing well. Diet is being advanced. Patient to be followed by Dr. ReRayann Hemans of tomorrow.   Electronic Signatures: WoLucilla LameMD)  (Signed 01-Feb-15 09:13)  Authored: Chief Complaint, VITAL SIGNS/ANCILLARY NOTES, Brief Assessment, Lab Results, Assessment/Plan   Last Updated: 01-Feb-15 09:13 by WoLucilla LameMD)

## 2014-12-25 NOTE — Consult Note (Signed)
PATIENT NAME:  Allison Hampton, PLOUFF MR#:  [id number] DATE OF BIRTH:  1928/07/08  DATE OF CONSULTATION:  09/30/2013  REFERRING PHYSICIAN:  Nicholes Mango, MD CONSULTING PHYSICIAN:  Theodore Demark, NP  REASON FOR CONSULTATION:  GI consult was ordered by Dr. Margaretmary Eddy evaluate acute abdominal pain.   HISTORY OF PRESENT ILLNESS: I appreciate consult for an 81 year old Caucasian woman admitted after colonoscopy/EGD yesterday after onset of severe RLQ pain last night that spread across her abdomen. States she is feeling much better today although her right lower quadrant is somewhat persistently tender, has not passed any gas or had any bowel movements, feels somewhat bloated although denies nausea, vomiting and any other GI-related complaints. Evidently, during colonoscopy she was treated for cecal AVMs by fulguration. There was a question of free air on abdominal x-ray last night but this was not found on CT scan done afterwards. There was, however, concern of enteritis versus small bowel obstruction. Surgery has been consulted and she was started on several antibiotics.  It was not felt that an operation was indicated at that time. No repeat abdominal x-ray has been done today yet. EGD revealed some gastritis and esophagitis. A KOH prep was done, positive for yeast.   PAST MEDICAL HISTORY: Chronic anemia, GERD, hypertension, macular degeneration, cholecystectomy, d and C.   ALLERGIES: CIPRO, CODEINE, PENICILLIN, SULFA, SEAFOOD.   HOME MEDICATIONS:  Zyrtec 10 mg p.o. daily, Valium 5 mg p.o. daily p.r.n., probiotics once daily, Nexium 20 mg p.o. daily, losartan 50 mg p.o. daily, ibuprofen 200 mg p.r.n., ASA 81 mg p.o. daily.   PSYCHOSOCIAL HISTORY: Lives alone, smokes cigarettes, has done so for more than 60 years. No alcohol or illicits.   FAMILY HISTORY: Significant for coronary artery disease and AAA.   REVIEW OF SYSTEMS: Ten systems reviewed, unremarkable other than what is noted above although does  have history of IDA which is why she underwent the aforesaid procedures. Hemoglobin has been running between 7 and 8 over the last few months. She is following with Dr. Rayann Heman is an outpatient for this.   LABORATORY DATA: Most recent labs: Glucose 137, BUN 9, creatinine 0.78, sodium 134, potassium 3.7, chloride 103, GFR greater than 60, calcium 7.7. Hepatic panel normal. WBC  19.5, hemoglobin 7.6, hematocrit 23.8, normal platelet count.   EGD had findings of gastritis and Candida esophagitis, colonoscopy had findings of hemorrhoids, diverticula cecal AVMs which were treated as noted above.   PHYSICAL EXAMINATION: VITAL SIGNS: Most recent 99.6, heart rate 99, respiratory rate 18, blood pressure 109/62, SaO2 on room air 93%.  GENERAL: Pleasant, well-appearing woman sitting in a chair, appears comfortable.  HEENT: Normocephalic, atraumatic. Conjunctiva pink. Sclerae clear.  NECK: Supple, soft. No abnormalities.  CHEST: Respirations eupneic. Lungs clear bilaterally.  CARDIAC: S1, S2, RRR. No MRG. No edema.  ABDOMEN: Mildly protuberant. Bowel sounds x 4. Do note some tenderness over the right lower quadrant, however, at this time there is no rebound tenderness and no significant other tenderness. There is no guarding, rigidity or hepatosplenomegaly.  SKIN: Warm, dry, pink. No erythema, lesion or rash.  EXTREMITIES: MAEW x 4. Strength 5 out of 5. Sensation intact.  NEUROLOGIC: Alert, oriented x 3. Cranial nerves II through XII intact. Speech clear. No facial droop.   IMPRESSION AND PLAN: Right lower quadrant pain may be some reactive serositis versus other. She is not improving so we will continue present. I appreciate the surgery consult. Flat and  erect abdominal film today. Spoke with Dr. Anselm Jungling and  he has ordered some Diflucan for the Candidal esophagitis.   Thank you very much for this consult. These services were provided by [facility], [facility], in collaboration with Loistine Simas, MD, also  discussed with Dr. Rayann Heman, whom I believe is actually following the patient at this time.    ____________________________ Theodore Demark, NP chl:cs D: 09/30/2013 15:33:54 ET T: 09/30/2013 15:47:03 ET JOB#: [id number]  cc: Theodore Demark, NP, <Dictator> [place] SIGNED 11/04/2013 8:16

## 2015-02-19 IMAGING — XA IR VASCULAR PROCEDURE
1 series · 1 of 1 positions shown · non-contrast
Comparison: none

[Series 1: single · 1 of 1 slices shown]
[im 1/1]
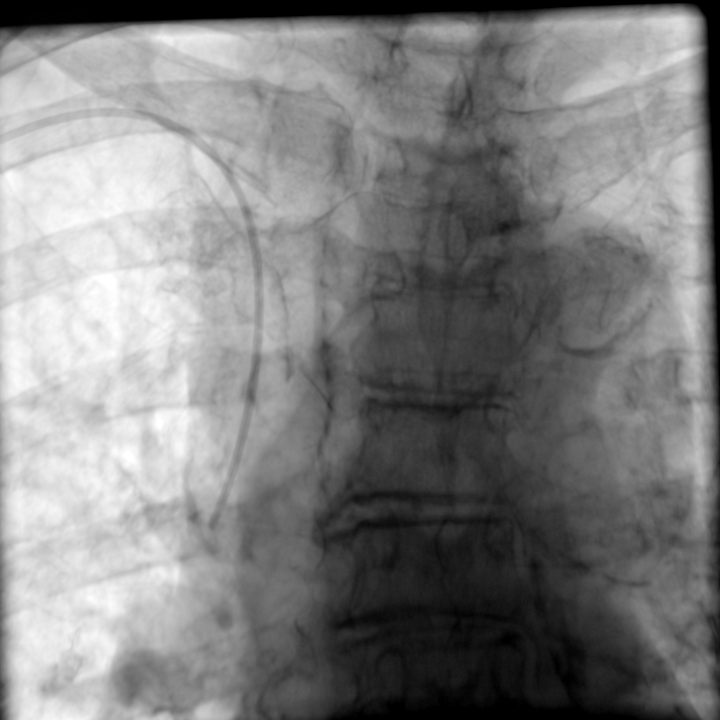

[1 of 1 positions shown; findings below may reference images not displayed]

IMAGES IMPORTED FROM THE SYNGO WORKFLOW SYSTEM
NO DICTATION FOR STUDY

## 2015-06-28 ENCOUNTER — Encounter: Payer: Self-pay | Admitting: Obstetrics and Gynecology

## 2015-08-17 ENCOUNTER — Ambulatory Visit (INDEPENDENT_AMBULATORY_CARE_PROVIDER_SITE_OTHER): Payer: Medicare Other | Admitting: Obstetrics and Gynecology

## 2015-08-17 ENCOUNTER — Encounter: Payer: Self-pay | Admitting: Obstetrics and Gynecology

## 2015-08-17 VITALS — BP 148/73 | HR 92 | Ht 65.0 in | Wt 159.7 lb

## 2015-08-17 DIAGNOSIS — Z1211 Encounter for screening for malignant neoplasm of colon: Secondary | ICD-10-CM

## 2015-08-17 DIAGNOSIS — N952 Postmenopausal atrophic vaginitis: Secondary | ICD-10-CM | POA: Diagnosis not present

## 2015-08-17 DIAGNOSIS — Z8742 Personal history of other diseases of the female genital tract: Secondary | ICD-10-CM | POA: Diagnosis not present

## 2015-08-17 DIAGNOSIS — J449 Chronic obstructive pulmonary disease, unspecified: Secondary | ICD-10-CM | POA: Insufficient documentation

## 2015-08-17 DIAGNOSIS — Z780 Asymptomatic menopausal state: Secondary | ICD-10-CM | POA: Diagnosis not present

## 2015-08-17 DIAGNOSIS — N362 Urethral caruncle: Secondary | ICD-10-CM | POA: Diagnosis not present

## 2015-08-17 DIAGNOSIS — K219 Gastro-esophageal reflux disease without esophagitis: Secondary | ICD-10-CM | POA: Insufficient documentation

## 2015-08-17 DIAGNOSIS — Z1239 Encounter for other screening for malignant neoplasm of breast: Secondary | ICD-10-CM | POA: Diagnosis not present

## 2015-08-17 DIAGNOSIS — F419 Anxiety disorder, unspecified: Secondary | ICD-10-CM | POA: Insufficient documentation

## 2015-08-17 NOTE — Patient Instructions (Signed)
1.  Pelvic exam is normal.  No Pap smear is necessary. 2.  Mammogram is ordered. Recommend 3-D mammogram due to dense breast tissue, history. 3.  Encouraged Tums for calcium supplementation. 4.  Stool guaiac card testing for colon cancer ordered. 5.  Return in 1 year for follow-up

## 2015-08-17 NOTE — Progress Notes (Signed)
Patient ID: Allison Hampton, female   DOB: 1928-01-26, 81 y.o.   MRN: [id number] ANNUAL PREVENTATIVE CARE GYN  ENCOUNTER NOTE  Subjective:       Allison Hampton is a 81 y.o. G63P1001 female here for a routine annual gynecologic exam.  Current complaints: 1.  none  2.  History of vaginal discharge. 3.  Menopause.  A 81-year-old white female, para 1, 001, menopausal, on no hormone replacement therapy, presents for [facility] Gynecologic evaluation.  One year ago patient had complicated medical history with bowel perforation during colonoscopy, followed by abdominal pelvic abscess, followed by chronic watery vaginal discharge that was thought to possibly be due to a fistula tract.  Patient was diagnosed with Pyometra and actinomyces infection and was treated with Keflex antibiotic with subsequent resolution of her discharge.  She has not had any further vaginal discharge or bleeding.  Bowel function and bladder function are normal.   Gynecologic History No LMP recorded. Patient is postmenopausal. Contraception: post menopausal status Last Pap: unsure last one. Results were: normal Last mammogram: 2014. Results were: normal- dense  Obstetric History OB History  Gravida Para Term Preterm AB SAB TAB Ectopic Multiple Living  1 1 1       1     # Outcome Date GA Lbr Len/2nd Weight Sex Delivery Anes PTL Lv  1 Term 1963   8 lb 9.6 oz (3.901 kg) F Vag-Spont   Y      Past Medical History  Diagnosis Date  . GERD (gastroesophageal reflux disease)   . Pyometra   . Atrophic vaginitis   . Uterine mass   . Abdominal mass   . Pelvic pain in female     Past Surgical History  Procedure Laterality Date  . Tonsillectomy    . Adenoids    . Cholecystectomy    . Cataract surgery      Current Outpatient Prescriptions on File Prior to Visit  Medication Sig Dispense Refill  . Lactobacillus (ACIDOPHILUS) 100 MG CAPS Take by mouth.    . losartan (COZAAR) 50 MG tablet Take 50 mg by mouth daily.     . Multiple Vitamins-Minerals (CENTRUM SILVER PO) Take by mouth.     No current facility-administered medications on file prior to visit.    Allergies  Allergen Reactions  . Cetirizine Other (See Comments)    tachycardia  . Ciprofloxacin   . Codeine   . Flagyl [Metronidazole]   . Iodinated Diagnostic Agents   . Moxifloxacin Hcl In Nacl Other (See Comments)    hallucination  . Penicillins   . Shellfish Allergy   . Statins Other (See Comments)    myalgias, unspecified  . Sulfa Antibiotics     Social History   Social History  . Marital Status: Widowed    Spouse Name: N/A  . Number of Children: N/A  . Years of Education: N/A   Occupational History  . Not on file.   Social History Main Topics  . Smoking status: Never Smoker   . Smokeless tobacco: Not on file  . Alcohol Use: No  . Drug Use: No  . Sexual Activity: Not Currently    Birth Control/ Protection: Post-menopausal   Other Topics Concern  . Not on file   Social History Narrative    Family History  Problem Relation Age of Onset  . Heart disease Mother   . Cancer Neg Hx   . Diabetes Neg Hx     The following portions of the patient's history  were reviewed and updated as appropriate: allergies, current medications, past family history, past medical history, past social history, past surgical history and problem list.  Review of Systems ROS Review of Systems - General ROS: negative for - chills, fatigue, fever, hot flashes, night sweats, weight gain or weight loss Psychological ROS: negative for -  depression, mood swings, physical abuse or sexual abuse.  POSITIVE-stress and anxiety Ophthalmic ROS: negative for - blurry vision, eye pain or loss of vision ENT ROS: negative for - headaches, hearing change, visual changes or vocal changes Allergy and Immunology ROS: negative for - hives, itchy/watery eyes or seasonal allergies Hematological and Lymphatic ROS: negative for - bleeding problems, bruising, swollen  lymph nodes or weight loss Endocrine ROS: negative for - galactorrhea, hair pattern changes, hot flashes, malaise/lethargy, mood swings, palpitations, polydipsia/polyuria, skin changes, temperature intolerance or unexpected weight changes Breast ROS: negative for - new or changing breast lumps or nipple discharge Respiratory ROS: negative for - cough or shortness of breath Cardiovascular ROS: negative for - chest pain, irregular heartbeat, palpitations or shortness of breath Gastrointestinal ROS: no abdominal pain, change in bowel habits, or black or bloody stools Genito-Urinary ROS: no dysuria, trouble voiding, or hematuria Musculoskeletal ROS: negative for - joint pain or joint stiffness Neurological ROS: negative for - bowel and bladder control changes Dermatological ROS: negative for rash and skin lesion changes   Objective:   BP 148/73 mmHg  Pulse 92  Ht 5\' 5"  (1.651 m)  Wt 159 lb 11.2 oz (72.439 kg)  BMI 26.58 kg/m2 CONSTITUTIONAL: Well-developed, well-nourished female in no acute distress.  PSYCHIATRIC: Normal mood and affect. Normal behavior. Normal judgment and thought content. Solomon: Alert and oriented to person, place, and time. Normal muscle tone coordination. No cranial nerve deficit noted. HENT:  Normocephalic, atraumatic EYES: Conjunctivae and EOM are normal. No scleral icterus.  NECK: Normal range of motion, supple, no masses.  Normal thyroid.  SKIN: Skin is warm and dry. No rash noted. Not diaphoretic. No erythema. No pallor. CARDIOVASCULAR: Normal heart rate noted, regular rhythm, no murmur. RESPIRATORY: Clear to auscultation bilaterally. Effort and breath sounds normal, no problems with respiration noted. BREASTS: Symmetric in size. No masses, skin changes, nipple drainage, or lymphadenopathy. Multiple benign moles present ABDOMEN: Soft, normal bowel sounds, no distention noted.  No tenderness, rebound or guarding. Right lower quadrant transverse abdominal wall  incision, healed BLADDER: Normal PELVIC:  External Genitalia: Atrophic changes  BUS: Urethral caruncle  Vagina: Atrophic changes  Cervix: Normal  Uterus: Normal Size and shape, Nontender  Adnexa: Normal  RV: External Exam NormaI, No Rectal Masses and Normal Sphincter tone  MUSCULOSKELETAL: Normal range of motion. No tenderness.  No cyanosis, clubbing, or edema.  2+ distal pulses. LYMPHATIC: No Axillary, Supraclavicular, or Inguinal Adenopathy.    Assessment:   Annual gynecologic examination 81 y.o. Contraception: post menopausal status Normal BMI History of vaginal discharge and actinomyces infection, resolved; asymptomatic. Menopausal state, asymptomatic. History of abdominal pelvic abscess, status post colonoscopy, complication, resolved  Plan:  Pap: Not needed Mammogram: Ordered Stool Guaiac Testing:  Ordered Labs: thru pcp Routine preventative health maintenance measures emphasized: Exercise/Diet/Weight control, Tobacco Warnings, Alcohol/Substance use risks and Stress Management Continue to take Tums for calcium supplementation Return to [place], CMA  Brayton Mars, MD  Note: This dictation was prepared with Dragon dictation along with smaller phrase technology. Any transcriptional errors that result from this process are unintentional.

## 2015-08-24 LAB — FECAL OCCULT BLOOD, IMMUNOCHEMICAL: FECAL OCCULT BLD: POSITIVE — AB

## 2015-08-30 ENCOUNTER — Telehealth: Payer: Self-pay

## 2015-08-30 DIAGNOSIS — R195 Other fecal abnormalities: Secondary | ICD-10-CM

## 2015-08-30 NOTE — Telephone Encounter (Signed)
-----   Message from Brayton Mars, MD sent at 08/26/2015 11:20 AM EST ----- Please notify - Abnormal Labs Recommend follow-up with general surgery or GI regarding blood in stool

## 2015-08-30 NOTE — Telephone Encounter (Signed)
Pt aware. Preferred to be referred to [facility] GI-referral put in.

## 2016-06-05 ENCOUNTER — Telehealth: Payer: Self-pay | Admitting: Obstetrics and Gynecology

## 2016-06-05 NOTE — Telephone Encounter (Signed)
This pt called and said she thought she was supposed to have some special breast mammogram because of her dense breast. She wanted me to ask and get someone to call her. I saw that there was a reg screening mammo in for her.

## 2016-06-05 NOTE — Telephone Encounter (Signed)
Pt aware she needs to request a 3D mammo when she calls and request an  appointment. Pt did not get a mammo in 2016. Given number to call and schedule.

## 2016-06-26 ENCOUNTER — Other Ambulatory Visit: Payer: Self-pay | Admitting: Obstetrics and Gynecology

## 2016-06-26 ENCOUNTER — Ambulatory Visit
Admission: RE | Admit: 2016-06-26 | Discharge: 2016-06-26 | Disposition: A | Discharge status: Home / Self Care | Payer: Medicare Other | Source: Ambulatory Visit | Attending: Obstetrics and Gynecology | Admitting: Obstetrics and Gynecology

## 2016-06-26 DIAGNOSIS — Z1239 Encounter for other screening for malignant neoplasm of breast: Secondary | ICD-10-CM

## 2016-06-26 DIAGNOSIS — Z1231 Encounter for screening mammogram for malignant neoplasm of breast: Secondary | ICD-10-CM | POA: Insufficient documentation

## 2016-07-17 ENCOUNTER — Observation Stay
Admission: EM | Admit: 2016-07-17 | Discharge: 2016-07-19 | Disposition: A | Discharge status: Home / Self Care | Payer: Medicare Other | Attending: Family Medicine | Admitting: Family Medicine

## 2016-07-17 DIAGNOSIS — I10 Essential (primary) hypertension: Secondary | ICD-10-CM | POA: Diagnosis not present

## 2016-07-17 DIAGNOSIS — K921 Melena: Secondary | ICD-10-CM | POA: Insufficient documentation

## 2016-07-17 DIAGNOSIS — K559 Vascular disorder of intestine, unspecified: Principal | ICD-10-CM | POA: Insufficient documentation

## 2016-07-17 DIAGNOSIS — K922 Gastrointestinal hemorrhage, unspecified: Secondary | ICD-10-CM | POA: Diagnosis not present

## 2016-07-17 DIAGNOSIS — R197 Diarrhea, unspecified: Secondary | ICD-10-CM | POA: Insufficient documentation

## 2016-07-17 DIAGNOSIS — Z7982 Long term (current) use of aspirin: Secondary | ICD-10-CM | POA: Insufficient documentation

## 2016-07-17 DIAGNOSIS — J449 Chronic obstructive pulmonary disease, unspecified: Secondary | ICD-10-CM | POA: Diagnosis not present

## 2016-07-17 DIAGNOSIS — E785 Hyperlipidemia, unspecified: Secondary | ICD-10-CM | POA: Diagnosis not present

## 2016-07-17 DIAGNOSIS — Z79899 Other long term (current) drug therapy: Secondary | ICD-10-CM | POA: Insufficient documentation

## 2016-07-17 DIAGNOSIS — R109 Unspecified abdominal pain: Secondary | ICD-10-CM | POA: Insufficient documentation

## 2016-07-17 DIAGNOSIS — Z66 Do not resuscitate: Secondary | ICD-10-CM | POA: Diagnosis not present

## 2016-07-17 DIAGNOSIS — Z9049 Acquired absence of other specified parts of digestive tract: Secondary | ICD-10-CM | POA: Diagnosis not present

## 2016-07-17 DIAGNOSIS — D509 Iron deficiency anemia, unspecified: Secondary | ICD-10-CM | POA: Insufficient documentation

## 2016-07-17 LAB — CBC WITH DIFFERENTIAL/PLATELET
BASOS ABS: 0 10*3/uL (ref 0–0.1)
BASOS PCT: 1 %
EOS PCT: 1 %
Eosinophils Absolute: 0.1 10*3/uL (ref 0–0.7)
HCT: 29.4 % — ABNORMAL LOW (ref 35.0–47.0)
Hemoglobin: 10.4 g/dL — ABNORMAL LOW (ref 12.0–16.0)
Lymphocytes Relative: 17 %
Lymphs Abs: 1.1 10*3/uL (ref 1.0–3.6)
MCH: 34.7 pg — ABNORMAL HIGH (ref 26.0–34.0)
MCHC: 35.4 g/dL (ref 32.0–36.0)
MCV: 98 fL (ref 80.0–100.0)
MONO ABS: 0.4 10*3/uL (ref 0.2–0.9)
Monocytes Relative: 7 %
Neutro Abs: 5.1 10*3/uL (ref 1.4–6.5)
Neutrophils Relative %: 74 %
PLATELETS: 229 10*3/uL (ref 150–440)
RBC: 3 MIL/uL — AB (ref 3.80–5.20)
RDW: 12.8 % (ref 11.5–14.5)
WBC: 6.7 10*3/uL (ref 3.6–11.0)

## 2016-07-17 LAB — HEMOGLOBIN AND HEMATOCRIT, BLOOD
HCT: 26.9 % — ABNORMAL LOW (ref 35.0–47.0)
HEMATOCRIT: 26.4 % — AB (ref 35.0–47.0)
HEMATOCRIT: 24.8 % — AB (ref 35.0–47.0)
HEMOGLOBIN: 9.2 g/dL — AB (ref 12.0–16.0)
HEMOGLOBIN: 8.5 g/dL — AB (ref 12.0–16.0)
Hemoglobin: 9.5 g/dL — ABNORMAL LOW (ref 12.0–16.0)

## 2016-07-17 LAB — COMPREHENSIVE METABOLIC PANEL
ALBUMIN: 3.7 g/dL (ref 3.5–5.0)
ALK PHOS: 50 U/L (ref 38–126)
ALT: 12 U/L — ABNORMAL LOW (ref 14–54)
ANION GAP: 12 (ref 5–15)
AST: 30 U/L (ref 15–41)
BILIRUBIN TOTAL: 0.5 mg/dL (ref 0.3–1.2)
BUN: 18 mg/dL (ref 6–20)
CALCIUM: 8.7 mg/dL — AB (ref 8.9–10.3)
CO2: 24 mmol/L (ref 22–32)
Chloride: 96 mmol/L — ABNORMAL LOW (ref 101–111)
Creatinine, Ser: 0.7 mg/dL (ref 0.44–1.00)
GFR calc Af Amer: >60 mL/min (ref >60)
GLUCOSE: 158 mg/dL — AB (ref 65–99)
Potassium: 3.4 mmol/L — ABNORMAL LOW (ref 3.5–5.1)
Sodium: 132 mmol/L — ABNORMAL LOW (ref 135–145)
TOTAL PROTEIN: 6.3 g/dL — AB (ref 6.5–8.1)

## 2016-07-17 LAB — OCCULT BLOOD X 1 CARD TO LAB, STOOL: FECAL OCCULT BLD: POSITIVE — AB

## 2016-07-17 LAB — C DIFFICILE QUICK SCREEN W PCR REFLEX
C DIFFICLE (CDIFF) ANTIGEN: NEGATIVE
C Diff interpretation: NOT DETECTED
C Diff toxin: NEGATIVE

## 2016-07-17 MED ORDER — SODIUM CHLORIDE 0.9 % IV SOLN
8.0000 mg/h | INTRAVENOUS | Status: DC
  Administered 2016-07-17 – 2016-07-19 (×5): 8 mg/h via INTRAVENOUS
  Filled 2016-07-17 (×5): qty 80

## 2016-07-17 MED ORDER — ONDANSETRON HCL 4 MG PO TABS: 4.0000 mg | ORAL_TABLET | Freq: Four times a day (QID) | ORAL | Status: DC | PRN

## 2016-07-17 MED ORDER — OCUVITE-LUTEIN PO CAPS
1.0000 | ORAL_CAPSULE | Freq: Every day | ORAL | Status: DC
  Administered 2016-07-17 – 2016-07-19 (×3): 1 via ORAL
  Filled 2016-07-17 (×3): qty 1

## 2016-07-17 MED ORDER — SODIUM CHLORIDE 0.9 % IV BOLUS (SEPSIS)
1000.0000 mL | Freq: Once | INTRAVENOUS | Status: AC
  Administered 2016-07-17: 1000 mL via INTRAVENOUS

## 2016-07-17 MED ORDER — ONDANSETRON HCL 4 MG/2ML IJ SOLN: 4.0000 mg | Freq: Four times a day (QID) | INTRAMUSCULAR | Status: DC | PRN

## 2016-07-17 MED ORDER — PANTOPRAZOLE SODIUM 40 MG IV SOLR
80.0000 mg | Freq: Once | INTRAVENOUS | Status: AC
  Administered 2016-07-17: 80 mg via INTRAVENOUS
  Filled 2016-07-17: qty 80

## 2016-07-17 MED ORDER — RISAQUAD PO CAPS
1.0000 | ORAL_CAPSULE | Freq: Two times a day (BID) | ORAL | Status: DC
  Administered 2016-07-17 – 2016-07-19 (×5): 1 via ORAL
  Filled 2016-07-17 (×5): qty 1

## 2016-07-17 MED ORDER — ACETAMINOPHEN 325 MG PO TABS: 650.0000 mg | ORAL_TABLET | Freq: Four times a day (QID) | ORAL | Status: DC | PRN

## 2016-07-17 MED ORDER — SODIUM CHLORIDE 0.9 % IV BOLUS (SEPSIS)
500.0000 mL | Freq: Once | INTRAVENOUS | Status: AC
  Administered 2016-07-17: 500 mL via INTRAVENOUS

## 2016-07-17 MED ORDER — ACETAMINOPHEN 650 MG RE SUPP: 650.0000 mg | Freq: Four times a day (QID) | RECTAL | Status: DC | PRN

## 2016-07-17 MED ORDER — ADULT MULTIVITAMIN W/MINERALS CH
1.0000 | ORAL_TABLET | Freq: Every day | ORAL | Status: DC
  Administered 2016-07-17 – 2016-07-19 (×3): 1 via ORAL
  Filled 2016-07-17 (×3): qty 1

## 2016-07-17 MED ORDER — DIAZEPAM 5 MG PO TABS
2.5000 mg | ORAL_TABLET | Freq: Every day | ORAL | Status: DC | PRN
  Administered 2016-07-18: 2.5 mg via ORAL
  Filled 2016-07-17: qty 1

## 2016-07-17 NOTE — ED Notes (Signed)
Positive hemoccult at bedside with MD Reita Cliche

## 2016-07-17 NOTE — ED Triage Notes (Signed)
Pt to triage via w/c with no distress noted; st diarrhea and abd cramping since last night; st foul smelling and dark bloody; st hx of same and taking iron

## 2016-07-17 NOTE — Progress Notes (Signed)
Pt came to floor at 1052. VSS. Pt was oriented to room and safety plan. Phone is in reach. Bed is in low position

## 2016-07-17 NOTE — Consult Note (Signed)
Allison Lame, MD [address] [place], [place] [address] Phone: [phone] Fax : [phone]  Consultation  Referring Provider:     No ref. provider found Primary Care Physician:  Marcello Fennel, MD Primary Gastroenterologist:  Dr. Vira Agar         Reason for Consultation:     Rectal bleeding and diarrhea  Date of Admission:  07/17/2016 Date of Consultation:  07/17/2016         HPI:   Allison Hampton is a 82 y.o. female who was admitted with rectal bleeding and diarrhea. The patient states that she had eaten dinner and started to have some abdominal pain. The pain was reported as crampy in nature. The patient then had a large bowel movement with a large amount of blood. The patient reports that she subsequently had further bowel movements each successively having less blood in it. The patient also reports that her cramps have decreased also. The patient has a history of a perforated colonoscopy from argon plasma coagulation of a cecal AVM. The patient was also found to have heme-positive stools which is not surprising since the patient was having frank blood from her rectum. I'm now being asked to see the patient for her rectal bleeding and diarrhea with abdominal cramps.  Past Medical History:  Diagnosis Date  . Abdominal mass   . Atrophic vaginitis   . GERD (gastroesophageal reflux disease)   . Pelvic pain in female   . Pyometra   . Uterine mass     Past Surgical History:  Procedure Laterality Date  . adenoids    . cataract surgery    . CHOLECYSTECTOMY    . TONSILLECTOMY      Prior to Admission medications   Medication Sig Start Date End Date Taking? Authorizing Provider  aspirin 81 MG chewable tablet Chew by mouth daily.   Yes Historical Provider, MD  diazepam (VALIUM) 5 MG tablet Take 0.5 mg by mouth daily.  08/10/15  Yes Historical Provider, MD  docusate calcium (STOOL SOFTENER LAXATIVE DC) 240 MG capsule Take by mouth daily as needed.    Yes  Historical Provider, MD  losartan (COZAAR) 50 MG tablet Take 50 mg by mouth daily.   Yes Historical Provider, MD  Multiple Vitamins-Minerals (CENTRUM SILVER PO) Take 1 tablet by mouth daily.    Yes Historical Provider, MD  Multiple Vitamins-Minerals (PRESERVISION AREDS 2) CAPS Take by mouth.   Yes Historical Provider, MD  Probiotic Product (GNP PROBIOTIC COLON SUPPORT) CAPS Take by mouth.   Yes Historical Provider, MD    Family History  Problem Relation Age of Onset  . Heart disease Mother   . Cancer Neg Hx   . Diabetes Neg Hx      Social History  Substance Use Topics  . Smoking status: Never Smoker  . Smokeless tobacco: Not on file  . Alcohol use No    Allergies as of 07/17/2016 - Review Complete 07/17/2016  Allergen Reaction Noted  . Cetirizine Other (See Comments) 08/17/2015  . Ciprofloxacin  06/27/2015  . Codeine  06/27/2015  . Flagyl [metronidazole]  06/27/2015  . Iodinated diagnostic agents  06/27/2015  . Moxifloxacin hcl in nacl Other (See Comments) 08/17/2015  . Penicillins  06/27/2015  . Shellfish allergy  06/27/2015  . Statins Other (See Comments) 08/17/2015  . Sulfa antibiotics  06/27/2015    Review of Systems:    All systems reviewed and negative except where noted in HPI.   Physical Exam:  Vital signs in last 24 hours: Temp:  [97.7 F (36.5 C)-98.6 F (37 C)] 98.6 F (37 C) (11/14 1052) Pulse Rate:  [78-107] 80 (11/14 1052) Resp:  [11-18] 13 (11/14 1052) BP: (99-123)/(52-90) 117/58 (11/14 1052) SpO2:  [94 %-99 %] 94 % (11/14 1052) Weight:  [150 lb (68 kg)] 150 lb (68 kg) (11/14 0650)   General:   Pleasant, cooperative in NAD Head:  Normocephalic and atraumatic. Eyes:   No icterus.   Conjunctiva pink. PERRLA. Ears:  Normal auditory acuity. Neck:  Supple; no masses or thyroidomegaly Lungs: Respirations even and unlabored. Lungs clear to auscultation bilaterally.   No wheezes, crackles, or rhonchi.  Heart:  Regular rate and rhythm;  Without murmur,  clicks, rubs or gallops Abdomen:  Soft, nondistended, nontender. Normal bowel sounds. No appreciable masses or hepatomegaly.  No rebound or guarding.  Rectal:  Not performed. Msk:  Symmetrical without gross deformities.    Extremities:  Without edema, cyanosis or clubbing. Neurologic:  Alert and oriented x3;  grossly normal neurologically. Skin:  Intact without significant lesions or rashes. Cervical Nodes:  No significant cervical adenopathy. Psych:  Alert and cooperative. Normal affect.  LAB RESULTS:  Recent Labs  07/17/16 0722 07/17/16 1142  WBC 6.7  --   HGB 10.4* 9.5*  HCT 29.4* 26.9*  PLT 229  --    BMET  Recent Labs  07/17/16 0722  NA 132*  K 3.4*  CL 96*  CO2 24  GLUCOSE 158*  BUN 18  CREATININE 0.70  CALCIUM 8.7*   LFT  Recent Labs  07/17/16 0722  PROT 6.3*  ALBUMIN 3.7  AST 30  ALT 12*  ALKPHOS 50  BILITOT 0.5   PT/INR No results for input(s): LABPROT, INR in the last 72 hours.  STUDIES: No results found.    Impression / Plan:   Allison Hampton is a 82 y.o. y/o female with New onset rectal bleeding with abdominal pain and diarrhea. The patient C. difficile was negative. The patient's symptomatology is most consistent with this being ischemic colitis. The patient had a colonoscopy 2 years ago and is not in need of any further colonoscopies at this time. The patient has been explained the pathophysiology of ischemic colitis. This should heal on its own and need no further intervention. The patient has been explained the plan and agrees with it.   Thank you for involving me in the care of this patient.      LOS: 0 days   Allison Lame, MD  07/17/2016, 4:03 PM   Note: This dictation was prepared with Dragon dictation along with smaller phrase technology. Any transcriptional errors that result from this process are unintentional.

## 2016-07-17 NOTE — ED Provider Notes (Signed)
[facility] Emergency Department Provider Note ____________________________________________   I have reviewed the triage vital signs and the triage nursing note.  HISTORY  Chief Complaint Diarrhea   Historian Patient  HPI Allison Hampton is a 82 y.o. female presents today after many episodes of diarrhea overnight. She states that it started after she had a chicken sandwich, several hours later she started having some abdominal cramping and then followed by black diarrhea with some blood streaks in there. She states it smelled foul. Denies fever. Denies vomiting. Denies recent black or bloody stools. She does take iron pills. She states that she also took 2 Pepto-Bismol. She has a history of prior GI bleeding as well as emergency colonoscopy in 2015 that resulted in a perforation after the procedure. She states that she was told that she has an area of the colon that is easily irritated and bleeds.  In terms of the abdominal cramping, that essentially gone now.    Past Medical History:  Diagnosis Date  . Abdominal mass   . Atrophic vaginitis   . GERD (gastroesophageal reflux disease)   . Pelvic pain in female   . Pyometra   . Uterine mass     Patient Active Problem List   Diagnosis Date Noted  . GERD (gastroesophageal reflux disease) 08/17/2015  . History of vaginal discharge 08/17/2015  . Menopause 08/17/2015  . Urethral caruncle 08/17/2015  . Vaginal atrophy 08/17/2015  . Chronic anxiety 08/17/2015  . Chronic obstructive pulmonary disease ([place]) 08/17/2015  . Benign essential HTN 08/06/2014  . Absolute anemia 02/09/2014    Past Surgical History:  Procedure Laterality Date  . adenoids    . cataract surgery    . CHOLECYSTECTOMY    . TONSILLECTOMY      Prior to Admission medications   Medication Sig Start Date End Date Taking? Authorizing Provider  aspirin 81 MG chewable tablet Chew by mouth daily.   Yes Historical Provider, MD  diazepam  (VALIUM) 5 MG tablet Take 0.5 mg by mouth daily.  08/10/15  Yes Historical Provider, MD  docusate calcium (STOOL SOFTENER LAXATIVE DC) 240 MG capsule Take by mouth daily as needed.    Yes Historical Provider, MD  losartan (COZAAR) 50 MG tablet Take 50 mg by mouth daily.   Yes Historical Provider, MD  Multiple Vitamins-Minerals (CENTRUM SILVER PO) Take 1 tablet by mouth daily.    Yes Historical Provider, MD  Multiple Vitamins-Minerals (PRESERVISION AREDS 2) CAPS Take by mouth.   Yes Historical Provider, MD  Probiotic Product (GNP PROBIOTIC COLON SUPPORT) CAPS Take by mouth.   Yes Historical Provider, MD    Allergies  Allergen Reactions  . Cetirizine Other (See Comments)    tachycardia  . Ciprofloxacin   . Codeine   . Flagyl [Metronidazole]   . Iodinated Diagnostic Agents   . Moxifloxacin Hcl In Nacl Other (See Comments)    hallucination  . Penicillins     Has patient had a PCN reaction causing immediate rash, facial/tongue/throat swelling, SOB or lightheadedness with hypotension: yes Has patient had a PCN reaction causing severe rash involving mucus membranes or skin necrosis: yes Has patient had a PCN reaction that required hospitalization no Has patient had a PCN reaction occurring within the last 10 years: no If all of the above answers are "NO", then may proceed with Cephalosporin use.   . Shellfish Allergy   . Statins Other (See Comments)    myalgias, unspecified  . Sulfa Antibiotics  Family History  Problem Relation Age of Onset  . Heart disease Mother   . Cancer Neg Hx   . Diabetes Neg Hx     Social History Social History  Substance Use Topics  . Smoking status: Never Smoker  . Smokeless tobacco: Not on file  . Alcohol use No    Review of Systems  Constitutional: Negative for fever. Eyes: Negative for visual changes. ENT: Negative for sore throat. Cardiovascular: Negative for chest pain. Respiratory: Negative for shortness of breath. Gastrointestinal: As  per history of present illness Genitourinary: Negative for dysuria. Musculoskeletal: Negative for back pain. Skin: Negative for rash. Neurological: Negative for headache. 10 point Review of Systems otherwise negative ____________________________________________   PHYSICAL EXAM:  VITAL SIGNS: ED Triage Vitals  Enc Vitals Group     BP 07/17/16 0649 108/90     Pulse Rate 07/17/16 0649 (!) 107     Resp 07/17/16 0649 18     Temp 07/17/16 0649 97.7 F (36.5 C)     Temp Source 07/17/16 0649 Oral     SpO2 07/17/16 0649 99 %     Weight 07/17/16 0650 150 lb (68 kg)     Height 07/17/16 0650 5\' 5"  (1.651 m)     Head Circumference --      Peak Flow --      Pain Score --      Pain Loc --      Pain Edu? --      Excl. in [place]? --      Constitutional: Alert and oriented. Well appearing and in no distress. HEENT   Head: Normocephalic and atraumatic.      Eyes: Conjunctivae are normal. PERRL. Normal extraocular movements.      Ears:         Nose: No congestion/rhinnorhea.   Mouth/Throat: Mucous membranes are moist.   Neck: No stridor. Cardiovascular/Chest: Normal rate, regular rhythm.  No murmurs, rubs, or gallops. Respiratory: Normal respiratory effort without tachypnea nor retractions. Breath sounds are clear and equal bilaterally. No wheezes/rales/rhonchi. Gastrointestinal: Soft. No distention, no guarding, no rebound. Nontender.   Genitourinary/rectal: Few non-irritated external hemorrhoids, nontender rectal exam, melena. Musculoskeletal: Nontender with normal range of motion in all extremities. No joint effusions.  No lower extremity tenderness.  No edema. Neurologic:  Normal speech and language. No gross or focal neurologic deficits are appreciated. Skin:  Skin is warm, dry and intact. No rash noted. Psychiatric: Mood and affect are normal. Speech and behavior are normal. Patient exhibits appropriate insight and  judgment.   ____________________________________________  LABS (pertinent positives/negatives)  Labs Reviewed  COMPREHENSIVE METABOLIC PANEL - Abnormal; Notable for the following:       Result Value   Sodium 132 (*)    Potassium 3.4 (*)    Chloride 96 (*)    Glucose, Bld 158 (*)    Calcium 8.7 (*)    Total Protein 6.3 (*)    ALT 12 (*)    All other components within normal limits  CBC WITH DIFFERENTIAL/PLATELET - Abnormal; Notable for the following:    RBC 3.00 (*)    Hemoglobin 10.4 (*)    HCT 29.4 (*)    MCH 34.7 (*)    All other components within normal limits  TYPE AND SCREEN    ____________________________________________    EKG I, Lisa Roca, MD, the attending physician have personally viewed and interpreted all ECGs.  87 bpm. Normal sinus rhythm. Narrow QRS. Normal axis. ST and T-wave ____________________________________________  RADIOLOGY  All Xrays were viewed by me. Imaging interpreted by Radiologist.  None __________________________________________  PROCEDURES  Procedure(s) performed: None  Critical Care performed: CRITICAL CARE Performed by: Lisa Roca   Total critical care time: 30 minutes  Critical care time was exclusive of separately billable procedures and treating other patients.  Critical care was necessary to treat or prevent imminent or life-threatening deterioration.  Critical care was time spent personally by me on the following activities: development of treatment plan with patient and/or surrogate as well as nursing, discussions with consultants, evaluation of patient's response to treatment, examination of patient, obtaining history from patient or surrogate, ordering and performing treatments and interventions, ordering and review of laboratory studies, ordering and review of radiographic studies, pulse oximetry and re-evaluation of patient's condition.   ____________________________________________   ED COURSE / ASSESSMENT  AND PLAN  Pertinent labs & imaging results that were available during my care of the patient were reviewed by me and considered in my medical decision making (see chart for details).  Reports diarrhea/volume loss and black/bloody stools overnight with history of colon bleeding in the past (but also on iron and pepto).   Some concern for volume given initially HR 107, on my exam mid-90s.  Initial BP above 100, on my exam 99 systolic.  No distress overall.  On aspirin, no additional blood thinners.  Rectal does confirm melena, therefore will place on protonix bolus and drip after starting with iv fluid bolus.   Hg 10, down from 13.9 in August.  No chest pain, no brisk bleeding witnessed here or additional BM here.  Type and screened.  No prbc transfusion at this point.    CONSULTATIONS:   Hospitalist for admission.   Patient / Family / Caregiver informed of clinical course, medical decision-making process, and agree with plan.    ___________________________________________   FINAL CLINICAL IMPRESSION(S) / ED DIAGNOSES   Final diagnoses:  Melena  Acute GI bleeding              Note: This dictation was prepared with Dragon dictation. Any transcriptional errors that result from this process are unintentional    Lisa Roca, MD 07/17/16 [phone]

## 2016-07-17 NOTE — H&P (Signed)
[place] at [place] NAME: Allison Hampton    MR#:  [id number]  DATE OF BIRTH:  1928/06/23  DATE OF ADMISSION:  07/17/2016  PRIMARY CARE PHYSICIAN: BABAOFF, Caryl Bis, MD   REQUESTING/REFERRING PHYSICIAN: Lord  CHIEF COMPLAINT:  Black stool  HISTORY OF PRESENT ILLNESS:  Allison Hampton  is a 82 y.o. female with a known history of COPD, ch iron def anemia, HTN ,HL and AVM of colon, Had colonoscopies times 4,last one 09/29/13. EGD times 2 last one 09/29/2013. Pt is on iron pills and took peptobismol for diarrhea and then noticed black tarry stool 2 times. Reporting  abd cramps but no vomiting   PAST MEDICAL HISTORY:   Past Medical History:  Diagnosis Date  . Abdominal mass   . Atrophic vaginitis   . GERD (gastroesophageal reflux disease)   . Pelvic pain in female   . Pyometra   . Uterine mass     PAST SURGICAL HISTOIRY:   Past Surgical History:  Procedure Laterality Date  . adenoids    . cataract surgery    . CHOLECYSTECTOMY    . TONSILLECTOMY      SOCIAL HISTORY:   Social History  Substance Use Topics  . Smoking status: Never Smoker  . Smokeless tobacco: Not on file  . Alcohol use No    FAMILY HISTORY:   Family History  Problem Relation Age of Onset  . Heart disease Mother   . Cancer Neg Hx   . Diabetes Neg Hx     DRUG ALLERGIES:   Allergies  Allergen Reactions  . Cetirizine Other (See Comments)    tachycardia  . Ciprofloxacin   . Codeine   . Flagyl [Metronidazole]   . Iodinated Diagnostic Agents   . Moxifloxacin Hcl In Nacl Other (See Comments)    hallucination  . Penicillins     Has patient had a PCN reaction causing immediate rash, facial/tongue/throat swelling, SOB or lightheadedness with hypotension: yes Has patient had a PCN reaction causing severe rash involving mucus membranes or skin necrosis: yes Has patient had a PCN reaction that required hospitalization no Has patient had a PCN  reaction occurring within the last 10 years: no If all of the above answers are "NO", then may proceed with Cephalosporin use.   . Shellfish Allergy   . Statins Other (See Comments)    myalgias, unspecified  . Sulfa Antibiotics     REVIEW OF SYSTEMS:  CONSTITUTIONAL: No fever, fatigue or weakness.  EYES: No blurred or double vision.  EARS, NOSE, AND THROAT: No tinnitus or ear pain.  RESPIRATORY: No cough, shortness of breath, wheezing or hemoptysis.  CARDIOVASCULAR: No chest pain, orthopnea, edema.  GASTROINTESTINAL: No nausea, vomiting, reporting diarrhea and abd cramps GENITOURINARY: No dysuria, hematuria.  ENDOCRINE: No polyuria, nocturia,  HEMATOLOGY: No anemia, easy bruising or bleeding SKIN: No rash or lesion. MUSCULOSKELETAL: No joint pain or arthritis.   NEUROLOGIC: No tingling, numbness, weakness.  PSYCHIATRY: No anxiety or depression.   MEDICATIONS AT HOME:   Prior to Admission medications   Medication Sig Start Date End Date Taking? Authorizing Provider  aspirin 81 MG chewable tablet Chew by mouth daily.   Yes Historical Provider, MD  diazepam (VALIUM) 5 MG tablet Take 0.5 mg by mouth daily.  08/10/15  Yes Historical Provider, MD  docusate calcium (STOOL SOFTENER LAXATIVE DC) 240 MG capsule Take by mouth daily as needed.    Yes Historical Provider, MD  losartan (COZAAR) 50  MG tablet Take 50 mg by mouth daily.   Yes Historical Provider, MD  Multiple Vitamins-Minerals (CENTRUM SILVER PO) Take 1 tablet by mouth daily.    Yes Historical Provider, MD  Multiple Vitamins-Minerals (PRESERVISION AREDS 2) CAPS Take by mouth.   Yes Historical Provider, MD  Probiotic Product (GNP PROBIOTIC COLON SUPPORT) CAPS Take by mouth.   Yes Historical Provider, MD      VITAL SIGNS:  Blood pressure (!) 117/58, pulse 80, temperature 98.6 F (37 C), temperature source Oral, resp. rate 13, height 5\' 5"  (1.651 m), weight 68 kg (150 lb), SpO2 94 %.  PHYSICAL EXAMINATION:  GENERAL:  82  y.o.-year-old patient lying in the bed with no acute distress.  EYES: Pupils equal, round, reactive to light and accommodation. No scleral icterus. Extraocular muscles intact.  HEENT: Head atraumatic, normocephalic. Oropharynx and nasopharynx clear.  NECK:  Supple, no jugular venous distention. No thyroid enlargement, no tenderness.  LUNGS: Normal breath sounds bilaterally, no wheezing, rales,rhonchi or crepitation. No use of accessory muscles of respiration.  CARDIOVASCULAR: S1, S2 normal. No murmurs, rubs, or gallops.  ABDOMEN: Soft, nontender, nondistended. Bowel sounds present. No organomegaly or mass.  EXTREMITIES: No pedal edema, cyanosis, or clubbing.  NEUROLOGIC: Cranial nerves II through XII are intact. Muscle strength 5/5 in all extremities. Sensation intact. Gait not checked.  PSYCHIATRIC: The patient is alert and oriented x 3.  SKIN: No obvious rash, lesion, or ulcer.   LABORATORY PANEL:   CBC  Recent Labs Lab 07/17/16 0722  07/17/16 1641  WBC 6.7  --   --   HGB 10.4*  < > 9.2*  HCT 29.4*  < > 26.4*  PLT 229  --   --   < > = values in this interval not displayed. ------------------------------------------------------------------------------------------------------------------  Chemistries   Recent Labs Lab 07/17/16 0722  NA 132*  K 3.4*  CL 96*  CO2 24  GLUCOSE 158*  BUN 18  CREATININE 0.70  CALCIUM 8.7*  AST 30  ALT 12*  ALKPHOS 50  BILITOT 0.5   ------------------------------------------------------------------------------------------------------------------  Cardiac Enzymes No results for input(s): TROPONINI in the last 168 hours. ------------------------------------------------------------------------------------------------------------------  RADIOLOGY:  No results found.  EKG:   Orders placed or performed during the hospital encounter of 07/17/16  . ED EKG  . ED EKG    IMPRESSION AND PLAN:   Allison Hampton  is a 82 y.o. female with a  known history of COPD, ch iron def anemia, HTN ,HL and AVM of colon, Had colonoscopies times 4,last one 09/29/13. EGD times 2 last one 09/29/2013. Pt is on iron pills and took peptobismol for diarrhea and then noticed black tarry stool 2 times. Reporting  abd cramps but no vomiting  # Melena with abdominal cramps and h/o ch iron def anemia and colon AVM  -  Could be 2/2 ischemic colitis Admit to med-surg Clear liquids PPI GI consult- recommended to monitor for now, as pt had colonoscopy 2015  Monitor hb and hct and transfuse prn   # COPD- ch- no exacerbation, nebs prn  # HTN - Hold Cozaar  # HL- not on meds, check FLP  GI Prophylaxis- PPI DVT prophylaxis- SCD  All the records are reviewed and case discussed with ED provider. Management plans discussed with the patient, family and they are in agreement.  CODE STATUS: DNR ; daughter and brother -in- law  Are HCPOA  TOTAL TIME TAKING CARE OF THIS PATIENT: 45 minutes.   Note: This dictation was prepared with Viviann Spare  dictation along with smaller phrase technology. Any transcriptional errors that result from this process are unintentional.  Nicholes Mango M.D on 07/17/2016 at 7:07 PM  Between 7am to 6pm - Pager - [phone]  After 6pm go to [email or web address] - password [email or web address] [place]  Office  [phone]  CC: Primary care physician; BABAOFF, Caryl Bis, MD

## 2016-07-17 NOTE — Care Management Obs Status (Signed)
[place] NOTIFICATION   Patient Details  Name: Allison Hampton MRN: [id number] Date of Birth: 11/21/1927   Medicare Observation Status Notification Given:  Yes    Beau Fanny, RN 07/17/2016, 10:01 AM

## 2016-07-18 DIAGNOSIS — K559 Vascular disorder of intestine, unspecified: Secondary | ICD-10-CM | POA: Diagnosis not present

## 2016-07-18 LAB — COMPREHENSIVE METABOLIC PANEL
ALBUMIN: 3.3 g/dL — AB (ref 3.5–5.0)
ALK PHOS: 47 U/L (ref 38–126)
ALT: 12 U/L — AB (ref 14–54)
AST: 22 U/L (ref 15–41)
Anion gap: 4 — ABNORMAL LOW (ref 5–15)
BILIRUBIN TOTAL: 0.4 mg/dL (ref 0.3–1.2)
BUN: 11 mg/dL (ref 6–20)
CALCIUM: 8.1 mg/dL — AB (ref 8.9–10.3)
CO2: 30 mmol/L (ref 22–32)
CREATININE: 0.66 mg/dL (ref 0.44–1.00)
Chloride: 103 mmol/L (ref 101–111)
GFR calc non Af Amer: >60 mL/min (ref >60)
GLUCOSE: 102 mg/dL — AB (ref 65–99)
Potassium: 4 mmol/L (ref 3.5–5.1)
SODIUM: 137 mmol/L (ref 135–145)
TOTAL PROTEIN: 5.5 g/dL — AB (ref 6.5–8.1)

## 2016-07-18 LAB — CBC
HEMATOCRIT: 25.8 % — AB (ref 35.0–47.0)
HEMOGLOBIN: 8.8 g/dL — AB (ref 12.0–16.0)
MCH: 34 pg (ref 26.0–34.0)
MCHC: 34.2 g/dL (ref 32.0–36.0)
MCV: 99.3 fL (ref 80.0–100.0)
Platelets: 188 10*3/uL (ref 150–440)
RBC: 2.6 MIL/uL — AB (ref 3.80–5.20)
RDW: 13.1 % (ref 11.5–14.5)
WBC: 4 10*3/uL (ref 3.6–11.0)

## 2016-07-18 LAB — HEMOGLOBIN AND HEMATOCRIT, BLOOD
HEMATOCRIT: 24 % — AB (ref 35.0–47.0)
Hemoglobin: 8.2 g/dL — ABNORMAL LOW (ref 12.0–16.0)

## 2016-07-18 LAB — PREPARE RBC (CROSSMATCH)

## 2016-07-18 LAB — ABO/RH: ABO/RH(D): O POS

## 2016-07-18 MED ORDER — SODIUM CHLORIDE 0.9 % IV SOLN: Freq: Once | INTRAVENOUS | Status: DC

## 2016-07-18 MED ORDER — ZOLPIDEM TARTRATE 5 MG PO TABS: 5.0000 mg | ORAL_TABLET | Freq: Every evening | ORAL | Status: DC | PRN

## 2016-07-18 MED ORDER — FERROUS SULFATE 325 (65 FE) MG PO TABS
325.0000 mg | ORAL_TABLET | Freq: Every day | ORAL | Status: DC
  Administered 2016-07-19: 325 mg via ORAL
  Filled 2016-07-18: qty 1

## 2016-07-18 NOTE — Progress Notes (Signed)
[place] at [place] NAME: Allison Hampton    MR#:  [id number]  DATE OF BIRTH:  11-01-1927  SUBJECTIVE:  CHIEF COMPLAINT:  Patient is reporting 2 episodes of dark-colored stool with fresh blood, denies any abdominal pain or nausea or vomiting  REVIEW OF SYSTEMS:  CONSTITUTIONAL: No fever, fatigue or weakness.  EYES: No blurred or double vision.  EARS, NOSE, AND THROAT: No tinnitus or ear pain.  RESPIRATORY: No cough, shortness of breath, wheezing or hemoptysis.  CARDIOVASCULAR: No chest pain, orthopnea, edema.  GASTROINTESTINAL: No nausea, vomiting, diarrhea or abdominal pain. Reporting bloody stool GENITOURINARY: No dysuria, hematuria.  ENDOCRINE: No polyuria, nocturia,  HEMATOLOGY: No anemia, easy bruising or bleeding SKIN: No rash or lesion. MUSCULOSKELETAL: No joint pain or arthritis.   NEUROLOGIC: No tingling, numbness, weakness.  PSYCHIATRY: No anxiety or depression.   DRUG ALLERGIES:   Allergies  Allergen Reactions  . Cetirizine Other (See Comments)    tachycardia  . Ciprofloxacin   . Codeine   . Flagyl [Metronidazole]   . Iodinated Diagnostic Agents   . Moxifloxacin Hcl In Nacl Other (See Comments)    hallucination  . Penicillins     Has patient had a PCN reaction causing immediate rash, facial/tongue/throat swelling, SOB or lightheadedness with hypotension: yes Has patient had a PCN reaction causing severe rash involving mucus membranes or skin necrosis: yes Has patient had a PCN reaction that required hospitalization no Has patient had a PCN reaction occurring within the last 10 years: no If all of the above answers are "NO", then may proceed with Cephalosporin use.   . Shellfish Allergy   . Statins Other (See Comments)    myalgias, unspecified  . Sulfa Antibiotics     VITALS:  Blood pressure 120/60, pulse 71, temperature 98.7 F (37.1 C), temperature source Oral, resp. rate 18, height 5\' 5"  (1.651 m),  weight 68 kg (150 lb), SpO2 96 %.  PHYSICAL EXAMINATION:  GENERAL:  82 y.o.-year-old patient lying in the bed with no acute distress.  EYES: Pupils equal, round, reactive to light and accommodation. No scleral icterus. Extraocular muscles intact.  HEENT: Head atraumatic, normocephalic. Oropharynx and nasopharynx clear.  NECK:  Supple, no jugular venous distention. No thyroid enlargement, no tenderness.  LUNGS: Normal breath sounds bilaterally, no wheezing, rales,rhonchi or crepitation. No use of accessory muscles of respiration.  CARDIOVASCULAR: S1, S2 normal. No murmurs, rubs, or gallops.  ABDOMEN: Soft, nontender, nondistended. Bowel sounds present. No organomegaly or mass.  EXTREMITIES: No pedal edema, cyanosis, or clubbing.  NEUROLOGIC: Cranial nerves II through XII are intact. Muscle strength 5/5 in all extremities. Sensation intact. Gait not checked.  PSYCHIATRIC: The patient is alert and oriented x 3.  SKIN: No obvious rash, lesion, or ulcer.    LABORATORY PANEL:   CBC  Recent Labs Lab 07/18/16 0528  WBC 4.0  HGB 8.8*  HCT 25.8*  PLT 188   ------------------------------------------------------------------------------------------------------------------  Chemistries   Recent Labs Lab 07/18/16 0528  NA 137  K 4.0  CL 103  CO2 30  GLUCOSE 102*  BUN 11  CREATININE 0.66  CALCIUM 8.1*  AST 22  ALT 12*  ALKPHOS 47  BILITOT 0.4   ------------------------------------------------------------------------------------------------------------------  Cardiac Enzymes No results for input(s): TROPONINI in the last 168 hours. ------------------------------------------------------------------------------------------------------------------  RADIOLOGY:  No results found.  EKG:   Orders placed or performed during the hospital encounter of 07/17/16  . ED EKG  . ED EKG    ASSESSMENT  AND PLAN:   [name]  is a 82 y.o. female with a known history of COPD, ch  iron def anemia, HTN ,HL and AVM of colon, Had colonoscopies times 4,last one 09/29/13. EGD times 2 last one 09/29/2013. Pt is on iron pills and took peptobismol for diarrhea and then noticed black tarry stool 2 times. Reporting  abd cramps but no vomiting at the time of admission  #GI bleed- Melena with fresh blood in the stool h/o ch iron def anemia and colon AVM  -  Could be 2/2 ischemic colitis Clear liquids PPI GI consult- recommended to monitor for now, as pt had colonoscopy 2015 . Follow-up with GI Monitor hb and hct and transfuse prn  hemoglobin 10.4-9.5-9.2-8.5-8.8  # COPD- ch- no exacerbation, nebs prn  # HTN - Hold Cozaar  # HL- not on meds, check FLP  GI Prophylaxis- PPI DVT prophylaxis- SCD     All the records are reviewed and case discussed with Care Management/Social Workerr. Management plans discussed with the patient, family and they are in agreement.  CODE STATUS:  dnr,Brother-in-law healthcare power of attorney  TOTAL TIME TAKING CARE OF THIS PATIENT: 59minutes.   POSSIBLE D/C IN 1-2 DAYS, DEPENDING ON CLINICAL CONDITION.  Note: This dictation was prepared with Dragon dictation along with smaller phrase technology. Any transcriptional errors that result from this process are unintentional.   Nicholes Mango M.D on 07/18/2016 at 1:56 PM  Between 7am to 6pm - Pager - [phone] After 6pm go to [email or web address] - password [email or web address] [facility]  Office  [phone]  CC: Primary care physician; BABAOFF, Caryl Bis, MD

## 2016-07-18 NOTE — Care Management Important Message (Deleted)
Important Message  Patient Details  Name: Allison Hampton MRN: [id number] Date of Birth: 12-21-1927   Medicare Important Message Given:  Yes    Beverly Sessions, RN 07/18/2016, 1:20 PM

## 2016-07-18 NOTE — Care Management (Signed)
Patient admitted for GI bleed.  RNCM placed.  Patient discussed with MD and bedside nurse.  Patient lives at home alone, and is independent of all ADL's.  No RNCM needs identified.  Please reconsult if indicated.

## 2016-07-19 DIAGNOSIS — K559 Vascular disorder of intestine, unspecified: Secondary | ICD-10-CM | POA: Diagnosis not present

## 2016-07-19 LAB — HEMOGLOBIN AND HEMATOCRIT, BLOOD
HCT: 23.4 % — ABNORMAL LOW (ref 35.0–47.0)
Hemoglobin: 8.3 g/dL — ABNORMAL LOW (ref 12.0–16.0)

## 2016-07-19 MED ORDER — FERROUS SULFATE 325 (65 FE) MG PO TABS: 325.0000 mg | ORAL_TABLET | Freq: Every day | ORAL | 3 refills | Status: DC

## 2016-07-19 NOTE — Discharge Instructions (Signed)
Gastrointestinal Bleeding °Introduction °Gastrointestinal bleeding is bleeding somewhere along the path food travels through the body (digestive tract). This path is anywhere between the mouth and the opening of the butt (anus). You may have blood in your poop (stools) or have black poop. If you throw up (vomit), there may be blood in it. °This condition can be mild, serious, or even life-threatening. If you have a lot of bleeding, you may need to stay in the hospital. °Follow these instructions at home: °· Take over-the-counter and prescription medicines only as told by your doctor. °· Eat foods that have a lot of fiber in them. These foods include whole grains, fruits, and vegetables. You can also try eating 1-3 prunes each day. °· Drink enough fluid to keep your pee (urine) clear or pale yellow. °· Keep all follow-up visits as told by your doctor. This is important. °Contact a doctor if: °· Your symptoms do not get better. °Get help right away if: °· Your bleeding gets worse. °· You feel dizzy or you pass out (faint). °· You feel weak. °· You have very bad cramps in your back or belly (abdomen). °· You pass large clumps of blood (clots) in your poop. °· Your symptoms are getting worse. °This information is not intended to replace advice given to you by your health care provider. Make sure you discuss any questions you have with your health care provider. °Document Released: 05/29/2008 Document Revised: 01/26/2016 Document Reviewed: 02/07/2015 °© 2017 [employer]vier ° °

## 2016-07-19 NOTE — Discharge Summary (Signed)
[place] [facility] [place] NAME: Allison Hampton    MR#:  [id number]  DATE OF BIRTH:  Jan 28, 1928  DATE OF ADMISSION:  07/17/2016   ADMITTING PHYSICIAN: Nicholes Mango, MD  DATE OF DISCHARGE: 07/19/16  PRIMARY CARE PHYSICIAN: BABAOFF, Caryl Bis, MD   ADMISSION DIAGNOSIS:  Melena [K92.1] Acute GI bleeding [K92.2] DISCHARGE DIAGNOSIS:  Active Problems:   Melena   Acute GI bleeding  SECONDARY DIAGNOSIS:   Past Medical History:  Diagnosis Date  . Abdominal mass   . Atrophic vaginitis   . GERD (gastroesophageal reflux disease)   . Pelvic pain in female   . Pyometra   . Uterine mass    HOSPITAL COURSE:  This is an 82 year old white female history of COPD, iron deficiency anemia, hypertension, hyperlipidemia and colonic AVMs who is admitted on 07/17/2016 with a diagnosis of melena. She was admitted to [place] on clear liquid diet and a PPI. GI consultation was requested and workup revealed symptomatology most consistent with ischemic colitis. No further intervention was recommended. On the day of discharge she reported no additional rectal bleeding. She denies abdominal pain, nausea and vomiting. The remainder of her hospital course has been unremarkable. She is tolerating by mouth his urine and dark stool output  DISCHARGE CONDITIONS:  GI bleed secondary to ischemic colitis COPD, chronic and stable Hypertension Hyperlipidemia CONSULTS OBTAINED:  Treatment Team:  Lucilla Lame, MD DRUG ALLERGIES:   Allergies  Allergen Reactions  . Cetirizine Other (See Comments)    tachycardia  . Ciprofloxacin   . Codeine   . Flagyl [Metronidazole]   . Iodinated Diagnostic Agents   . Moxifloxacin Hcl In Nacl Other (See Comments)    hallucination  . Penicillins     Has patient had a PCN reaction causing immediate rash, facial/tongue/throat swelling, SOB or lightheadedness with hypotension: yes Has patient had a PCN reaction causing severe rash  involving mucus membranes or skin necrosis: yes Has patient had a PCN reaction that required hospitalization no Has patient had a PCN reaction occurring within the last 10 years: no If all of the above answers are "NO", then may proceed with Cephalosporin use.   . Shellfish Allergy   . Statins Other (See Comments)    myalgias, unspecified  . Sulfa Antibiotics    DISCHARGE MEDICATIONS:     Medication List    STOP taking these medications   aspirin 81 MG chewable tablet     TAKE these medications   CENTRUM SILVER PO Take 1 tablet by mouth daily.   PRESERVISION AREDS 2 Caps Take by mouth.   diazepam 5 MG tablet Commonly known as:  VALIUM Take 0.5 mg by mouth daily.   ferrous sulfate 325 (65 FE) MG tablet Take 1 tablet (325 mg total) by mouth daily with breakfast. Start taking on:  07/20/2016   GNP PROBIOTIC COLON SUPPORT Caps Take by mouth.   losartan 50 MG tablet Commonly known as:  COZAAR Take 50 mg by mouth daily.   STOOL SOFTENER LAXATIVE DC 240 MG capsule Generic drug:  docusate calcium Take by mouth daily as needed.        DISCHARGE INSTRUCTIONS:  Follow-up with gastroenterology and primary care provider as per discharge instructions DIET:  Cardiac diet DISCHARGE CONDITION:  Stable ACTIVITY:  Activity as tolerated OXYGEN:  Home Oxygen: No.  Oxygen Delivery: room air DISCHARGE LOCATION:  home   If you experience worsening of your admission symptoms, develop shortness of breath, life  threatening emergency, suicidal or homicidal thoughts you must seek medical attention immediately by calling 911 or calling your MD immediately  if symptoms less severe.  You Must read complete instructions/literature along with all the possible adverse reactions/side effects for all the Medicines you take and that have been prescribed to you. Take any new Medicines after you have completely understood and accpet all the possible adverse reactions/side effects.   Please  note  You were cared for by a hospitalist during your hospital stay. If you have any questions about your discharge medications or the care you received while you were in the hospital after you are discharged, you can call the unit and asked to speak with the hospitalist on call if the hospitalist that took care of you is not available. Once you are discharged, your primary care physician will handle any further medical issues. Please note that NO REFILLS for any discharge medications will be authorized once you are discharged, as it is imperative that you return to your primary care physician (or establish a relationship with a primary care physician if you do not have one) for your aftercare needs so that they can reassess your need for medications and monitor your lab values.    On the day of Discharge:  VITAL SIGNS:  Blood pressure (!) 115/53, pulse 71, temperature 98.6 F (37 C), temperature source Oral, resp. rate 18, height 5\' 5"  (1.651 m), weight 68 kg (150 lb), SpO2 92 %. PHYSICAL EXAMINATION:  GENERAL:  82 y.o.-year-old patient lying in the bed with no acute distress.  EYES: Pupils equal, round, reactive to light and accommodation. No scleral icterus. Extraocular muscles intact.  HEENT: Head atraumatic, normocephalic. Oropharynx and nasopharynx clear.  NECK:  Supple, no jugular venous distention. No thyroid enlargement, no tenderness.  LUNGS: Normal breath sounds bilaterally, no wheezing, rales,rhonchi or crepitation. No use of accessory muscles of respiration.  CARDIOVASCULAR: S1, S2 normal. No murmurs, rubs, or gallops.  ABDOMEN: Soft, non-tender, non-distended. Bowel sounds present. No organomegaly or mass.  EXTREMITIES: No pedal edema, cyanosis, or clubbing.  NEUROLOGIC: Cranial nerves II through XII are intact. Muscle strength 5/5 in all extremities. Sensation intact. Gait not checked.  PSYCHIATRIC: The patient is alert and oriented x 3.  SKIN: No obvious rash, lesion, or ulcer.    DATA REVIEW:   CBC  Recent Labs Lab 07/18/16 0528  07/19/16 0003  WBC 4.0  --   --   HGB 8.8*  < > 8.3*  HCT 25.8*  < > 23.4*  PLT 188  --   --   < > = values in this interval not displayed.  Chemistries   Recent Labs Lab 07/18/16 0528  NA 137  K 4.0  CL 103  CO2 30  GLUCOSE 102*  BUN 11  CREATININE 0.66  CALCIUM 8.1*  AST 22  ALT 12*  ALKPHOS 39  BILITOT 0.4     Microbiology Results  Results for orders placed or performed during the hospital encounter of 07/17/16  C difficile quick scan w PCR reflex     Status: None   Collection Time: 07/17/16 11:30 AM  Result Value Ref Range Status   C Diff antigen NEGATIVE NEGATIVE Final   C Diff toxin NEGATIVE NEGATIVE Final   C Diff interpretation No C. difficile detected.  Final    RADIOLOGY:  No results found.   Management plans discussed with the patient, family and they are in agreement.  CODE STATUS:     Code Status  Orders        Start     Ordered   07/17/16 1053  Do not attempt resuscitation (DNR)  Continuous    Question Answer Comment  In the event of cardiac or respiratory ARREST Do not call a "code blue"   In the event of cardiac or respiratory ARREST Do not perform Intubation, CPR, defibrillation or ACLS   In the event of cardiac or respiratory ARREST Use medication by any route, position, wound care, and other measures to relive pain and suffering. May use oxygen, suction and manual treatment of airway obstruction as needed for comfort.   Comments rn may pronounce      07/17/16 1052    Code Status History    Date Active Date Inactive Code Status Order ID Comments User Context   This patient has a current code status but no historical code status.      TOTAL TIME TAKING CARE OF THIS PATIENT: 30 minutes.    Harvie Bridge M.D on 07/19/2016 at 10:08 AM  Between 7am to 6pm - Pager - [phone]  After 6pm go to [email or web address] - Proofreader  [facility]   Office  [phone]  CC: Primary care physician; BABAOFF, Caryl Bis, MD   Note: This dictation was prepared with Dragon dictation along with smaller phrase technology. Any transcriptional errors that result from this process are unintentional.

## 2016-07-19 NOTE — Progress Notes (Signed)
IV was removed. Discharge instructions, follow-up appointments, and prescriptions were provided to the pt. All questions were answered. The pt was taken downstairs via wheelchair by [employer].  

## 2016-07-21 LAB — TYPE AND SCREEN
ABO/RH(D): O POS
ANTIBODY SCREEN: NEGATIVE
Unit division: 0

## 2016-07-22 ENCOUNTER — Emergency Department: Payer: Medicare Other

## 2016-07-22 ENCOUNTER — Emergency Department
Admission: EM | Admit: 2016-07-22 | Discharge: 2016-07-22 | Disposition: A | Discharge status: Home / Self Care | Payer: Medicare Other | Attending: Emergency Medicine | Admitting: Emergency Medicine

## 2016-07-22 ENCOUNTER — Encounter: Payer: Self-pay | Admitting: Emergency Medicine

## 2016-07-22 DIAGNOSIS — R1031 Right lower quadrant pain: Secondary | ICD-10-CM | POA: Diagnosis present

## 2016-07-22 DIAGNOSIS — I10 Essential (primary) hypertension: Secondary | ICD-10-CM | POA: Insufficient documentation

## 2016-07-22 DIAGNOSIS — K5730 Diverticulosis of large intestine without perforation or abscess without bleeding: Secondary | ICD-10-CM | POA: Insufficient documentation

## 2016-07-22 DIAGNOSIS — Z79899 Other long term (current) drug therapy: Secondary | ICD-10-CM | POA: Insufficient documentation

## 2016-07-22 LAB — URINALYSIS COMPLETE WITH MICROSCOPIC (ARMC ONLY)
BACTERIA UA: NONE SEEN
Bilirubin Urine: NEGATIVE
Glucose, UA: NEGATIVE mg/dL
Hgb urine dipstick: NEGATIVE
Nitrite: NEGATIVE
PH: 6 (ref 5.0–8.0)
PROTEIN: NEGATIVE mg/dL
SQUAMOUS EPITHELIAL / LPF: NONE SEEN
Specific Gravity, Urine: 1.002 — ABNORMAL LOW (ref 1.005–1.030)

## 2016-07-22 LAB — COMPREHENSIVE METABOLIC PANEL
ALBUMIN: 4 g/dL (ref 3.5–5.0)
ALK PHOS: 59 U/L (ref 38–126)
ALT: 14 U/L (ref 14–54)
ANION GAP: 9 (ref 5–15)
AST: 24 U/L (ref 15–41)
BILIRUBIN TOTAL: 0.8 mg/dL (ref 0.3–1.2)
BUN: 8 mg/dL (ref 6–20)
CALCIUM: 8.4 mg/dL — AB (ref 8.9–10.3)
CO2: 24 mmol/L (ref 22–32)
Chloride: 99 mmol/L — ABNORMAL LOW (ref 101–111)
Creatinine, Ser: 0.67 mg/dL (ref 0.44–1.00)
GLUCOSE: 112 mg/dL — AB (ref 65–99)
POTASSIUM: 3.2 mmol/L — AB (ref 3.5–5.1)
Sodium: 132 mmol/L — ABNORMAL LOW (ref 135–145)
TOTAL PROTEIN: 6.7 g/dL (ref 6.5–8.1)

## 2016-07-22 LAB — CBC
HEMATOCRIT: 23.7 % — AB (ref 35.0–47.0)
HEMOGLOBIN: 8.8 g/dL — AB (ref 12.0–16.0)
MCH: 36.6 pg — ABNORMAL HIGH (ref 26.0–34.0)
MCHC: 37 g/dL — AB (ref 32.0–36.0)
MCV: 99 fL (ref 80.0–100.0)
Platelets: 263 10*3/uL (ref 150–440)
RBC: 2.39 MIL/uL — AB (ref 3.80–5.20)
RDW: 12.9 % (ref 11.5–14.5)
WBC: 5.2 10*3/uL (ref 3.6–11.0)

## 2016-07-22 MED ORDER — BARIUM SULFATE 2.1 % PO SUSP: 450.0000 mL | ORAL | Status: AC

## 2016-07-22 NOTE — ED Provider Notes (Addendum)
[facility] Emergency Department Provider Note  ____________________________________________  Time seen: Approximately 10:24 PM  I have reviewed the triage vital signs and the nursing notes.   HISTORY  Chief Complaint Abdominal Pain    HPI Allison Hampton is a 82 y.o. female who complains of constant gradual onset right lower quadrant abdominal pain since lunchtime when she ate peanut butter and ice cream and other foods as part of a large meal. Pain is continued throughout the afternoon. Nonradiating. No aggravating or alleviating factors. Moderate intensity, cramping. No vomiting or diarrhea. No fevers or chills. No leg weakness or urinary symptoms.  Was recently on the hospital forgI bleed, reports that her symptoms from that have resolved and she is no longer having dark stool.  Has appointment with PCP Babaoff in 2 days   Past Medical History:  Diagnosis Date  . Abdominal mass   . Atrophic vaginitis   . GERD (gastroesophageal reflux disease)   . Pelvic pain in female   . Pyometra   . Uterine mass      Patient Active Problem List   Diagnosis Date Noted  . Melena 07/17/2016  . Acute GI bleeding   . GERD (gastroesophageal reflux disease) 08/17/2015  . History of vaginal discharge 08/17/2015  . Menopause 08/17/2015  . Urethral caruncle 08/17/2015  . Vaginal atrophy 08/17/2015  . Chronic anxiety 08/17/2015  . Chronic obstructive pulmonary disease ([place]) 08/17/2015  . Benign essential HTN 08/06/2014  . Absolute anemia 02/09/2014     Past Surgical History:  Procedure Laterality Date  . adenoids    . cataract surgery    . CHOLECYSTECTOMY    . TONSILLECTOMY       Prior to Admission medications   Medication Sig Start Date End Date Taking? Authorizing Provider  diazepam (VALIUM) 5 MG tablet Take 0.5 mg by mouth daily.  08/10/15  Yes Historical Provider, MD  docusate calcium (STOOL SOFTENER LAXATIVE DC) 240 MG capsule Take by mouth  daily as needed.    Yes Historical Provider, MD  ferrous sulfate 325 (65 FE) MG tablet Take 1 tablet (325 mg total) by mouth daily with breakfast. 07/20/16  Yes Alexis Hugelmeyer, DO  losartan (COZAAR) 50 MG tablet Take 50 mg by mouth daily.   Yes Historical Provider, MD  Multiple Vitamins-Minerals (CENTRUM SILVER PO) Take 1 tablet by mouth daily.    Yes Historical Provider, MD  Multiple Vitamins-Minerals (PRESERVISION AREDS 2) CAPS Take by mouth.   Yes Historical Provider, MD  Probiotic Product (GNP PROBIOTIC COLON SUPPORT) CAPS Take by mouth.   Yes Historical Provider, MD     Allergies Cetirizine; Ciprofloxacin; Codeine; Flagyl [metronidazole]; Iodinated diagnostic agents; Moxifloxacin hcl in nacl; Penicillins; Shellfish allergy; Statins; and Sulfa antibiotics   Family History  Problem Relation Age of Onset  . Heart disease Mother   . Cancer Neg Hx   . Diabetes Neg Hx     Social History Social History  Substance Use Topics  . Smoking status: Never Smoker  . Smokeless tobacco: Never Used  . Alcohol use No    Review of Systems  Constitutional:   No fever or chills.  ENT:   No sore throat. No rhinorrhea. Cardiovascular:   No chest pain. Respiratory:   No dyspnea or cough. Gastrointestinal:   Right lower quadrant abdominal pain as above without vomiting and diarrhea.  Genitourinary:   Negative for dysuria or difficulty urinating. 10-point ROS otherwise negative.  ____________________________________________   PHYSICAL EXAM:  VITAL SIGNS: ED Triage  Vitals  Enc Vitals Group     BP 07/22/16 1819 139/71     Pulse Rate 07/22/16 1819 (!) 101     Resp 07/22/16 1819 18     Temp 07/22/16 1819 98.2 F (36.8 C)     Temp src --      SpO2 07/22/16 1819 98 %     Weight 07/22/16 1816 150 lb (68 kg)     Height 07/22/16 1816 5\' 5"  (1.651 m)     Head Circumference --      Peak Flow --      Pain Score 07/22/16 1817 5     Pain Loc --      Pain Edu? --      Excl. in [place]? --      Vital signs reviewed, nursing assessments reviewed.   Constitutional:   Alert and oriented. Well appearing and in no distress. Eyes:   No scleral icterus. No conjunctival pallor. PERRL. EOMI.  No nystagmus. ENT   Head:   Normocephalic and atraumatic.   Nose:   No congestion/rhinnorhea. No septal hematoma   Mouth/Throat:   MMM, no pharyngeal erythema. No peritonsillar mass.    Neck:   No stridor. No SubQ emphysema. No meningismus. Hematological/Lymphatic/Immunilogical:   No cervical lymphadenopathy. Cardiovascular:   RRR. Symmetric bilateral radial and DP pulses.  No murmurs.  Respiratory:   Normal respiratory effort without tachypnea nor retractions. Breath sounds are clear and equal bilaterally. No wheezes/rales/rhonchi. Gastrointestinal:   Soft with tenderness at the far right lower quadrant of the abdomen just inside the iliac wing.No tenderness at McBurney's point. Non distended. There is no CVA tenderness.  No rebound, rigidity, or guarding. Genitourinary:   deferred Musculoskeletal:   Nontender with normal range of motion in all extremities. No joint effusions.  No lower extremity tenderness.  No edema. Neurologic:   Normal speech and language.  CN 2-10 normal. Motor grossly intact. No gross focal neurologic deficits are appreciated.  Skin:    Skin is warm, dry and intact. No rash noted.  No petechiae, purpura, or bullae.  ____________________________________________    LABS (pertinent positives/negatives) (all labs ordered are listed, but only abnormal results are displayed) Labs Reviewed  COMPREHENSIVE METABOLIC PANEL - Abnormal; Notable for the following:       Result Value   Sodium 132 (*)    Potassium 3.2 (*)    Chloride 99 (*)    Glucose, Bld 112 (*)    Calcium 8.4 (*)    All other components within normal limits  CBC - Abnormal; Notable for the following:    RBC 2.39 (*)    Hemoglobin 8.8 (*)    HCT 23.7 (*)    MCH 36.6 (*)    MCHC 37.0 (*)     All other components within normal limits  URINALYSIS COMPLETEWITH MICROSCOPIC ([facility] ONLY) - Abnormal; Notable for the following:    Color, Urine STRAW (*)    APPearance CLEAR (*)    Ketones, ur TRACE (*)    Specific Gravity, Urine 1.002 (*)    Leukocytes, UA TRACE (*)    All other components within normal limits  URINE CULTURE  POC OCCULT BLOOD, ED   ____________________________________________   EKG    ____________________________________________    RADIOLOGY  CT abdomen and pelvis shows diffuse diverticulosis without any other acute findings  ____________________________________________   PROCEDURES Procedures  ____________________________________________   INITIAL IMPRESSION / ASSESSMENT AND PLAN / ED COURSE  Pertinent labs & imaging results that  were available during my care of the patient were reviewed by me and considered in my medical decision making (see chart for details).  Patient presents with right lower quadrant abdominal pain. Vital signs unremarkable, exam essentially unremarkable, low suspicion for appendicitis. Due to the age and recent illness obtained a CT scan which was essentially unremarkable.Considering the patient's symptoms, medical history, and physical examination today, I have low suspicion for cholecystitis or biliary pathology, pancreatitis, perforation or bowel obstruction, hernia, intra-abdominal abscess, AAA or dissection, volvulus or intussusception, mesenteric ischemia, or appendicitis.  Labs unremarkable. Stable anemia. Urinalysis negative.  Discharge home, continue Colace, follow-up with primary care tomorrow.     Clinical Course    ____________________________________________   FINAL CLINICAL IMPRESSION(S) / ED DIAGNOSES  Final diagnoses:  Right lower quadrant abdominal pain  Diverticulosis of large intestine without hemorrhage       Portions of this note were generated with dragon dictation software. Dictation  errors may occur despite best attempts at proofreading.    Carrie Mew, MD 07/22/16 2228    Carrie Mew, MD 07/22/16 [phone]

## 2016-07-22 NOTE — ED Notes (Signed)
Report from [place], [place].

## 2016-07-22 NOTE — ED Notes (Signed)
Pt sipping on second bottle of po contrast.

## 2016-07-22 NOTE — ED Triage Notes (Signed)
Released from hospital on Monday - for gi bleed. States now having low rt abd pain.

## 2016-07-22 NOTE — ED Notes (Signed)
States no longer having dark stools.

## 2016-07-22 NOTE — ED Notes (Signed)
Pt has finished barium. Pt up to commode to void. Pt updated on delay for ct scan. Family at bedside.

## 2016-07-22 NOTE — ED Notes (Signed)
Introduced self to pt. Pt denies needs at this time. resps unlabored. Vss, call bell at side.

## 2016-07-22 NOTE — ED Notes (Signed)
Patient transported to CT 

## 2016-07-24 LAB — URINE CULTURE

## 2016-08-08 ENCOUNTER — Ambulatory Visit: Payer: Medicare Other | Admitting: Gastroenterology

## 2016-08-17 NOTE — Progress Notes (Signed)
Patient ID: Allison Hampton, female   DOB: [birth year], 82 y.o.   MRN: [id number] ANNUAL PREVENTATIVE CARE GYN  ENCOUNTER NOTE  Subjective:       Allison Hampton is a 82 y.o. G44P1001 female here for a routine annual gynecologic exam.  Current complaints: 1.  Recent hospitalization for ischemic colitis, now resolved; may need ultrasound and vascular surgery consultation 2. Menopause, asymptomatic 3. Past history of pyometra, resolved    Gynecologic History No LMP recorded. Patient is postmenopausal. Contraception: post menopausal status Last Pap: unsure last one. Results were: normal Last mammogram:06/26/2016 birad 1. Results were: normal- dense  Obstetric History OB History  Gravida Para Term Preterm AB Living  1 1 1     1   SAB TAB Ectopic Multiple Live Births          1    # Outcome Date GA Lbr Len/2nd Weight Sex Delivery Anes PTL Lv  1 Term 1963   8 lb 9.6 oz (3.901 kg) F Vag-Spont   LIV      Past Medical History:  Diagnosis Date  . Abdominal mass   . Atrophic vaginitis   . GERD (gastroesophageal reflux disease)   . Pelvic pain in female   . Pyometra   . Uterine mass     Past Surgical History:  Procedure Laterality Date  . adenoids    . cataract surgery    . CHOLECYSTECTOMY    . TONSILLECTOMY      Current Outpatient Prescriptions on File Prior to Visit  Medication Sig Dispense Refill  . diazepam (VALIUM) 5 MG tablet Take 0.5 mg by mouth daily.     Marland Kitchen docusate calcium (STOOL SOFTENER LAXATIVE DC) 240 MG capsule Take by mouth daily as needed.     . ferrous sulfate 325 (65 FE) MG tablet Take 1 tablet (325 mg total) by mouth daily with breakfast. 30 tablet 3  . losartan (COZAAR) 50 MG tablet Take 50 mg by mouth daily.    . Multiple Vitamins-Minerals (CENTRUM SILVER PO) Take 1 tablet by mouth daily.     . Multiple Vitamins-Minerals (PRESERVISION AREDS 2) CAPS Take by mouth.    . Probiotic Product (GNP PROBIOTIC COLON SUPPORT) CAPS Take by mouth.     No  current facility-administered medications on file prior to visit.     Allergies  Allergen Reactions  . Cetirizine Other (See Comments)    tachycardia  . Ciprofloxacin   . Codeine   . Flagyl [Metronidazole]   . Iodinated Diagnostic Agents   . Moxifloxacin Hcl In Nacl Other (See Comments)    hallucination  . Penicillins     Has patient had a PCN reaction causing immediate rash, facial/tongue/throat swelling, SOB or lightheadedness with hypotension: yes Has patient had a PCN reaction causing severe rash involving mucus membranes or skin necrosis: yes Has patient had a PCN reaction that required hospitalization no Has patient had a PCN reaction occurring within the last 10 years: no If all of the above answers are "NO", then may proceed with Cephalosporin use.   . Shellfish Allergy   . Statins Other (See Comments)    myalgias, unspecified  . Sulfa Antibiotics     Social History   Social History  . Marital status: Widowed    Spouse name: N/A  . Number of children: N/A  . Years of education: N/A   Occupational History  . Not on file.   Social History Main Topics  . Smoking status: Never Smoker  .  Smokeless tobacco: Never Used  . Alcohol use No  . Drug use: No  . Sexual activity: Not Currently    Birth control/ protection: Post-menopausal   Other Topics Concern  . Not on file   Social History Narrative  . No narrative on file    Family History  Problem Relation Age of Onset  . Heart disease Mother   . Cancer Neg Hx   . Diabetes Neg Hx     The following portions of the patient's history were reviewed and updated as appropriate: allergies, current medications, past family history, past medical history, past social history, past surgical history and problem list.  Review of Systems ROS Review of Systems - General ROS: negative for - chills, fatigue, fever, hot flashes, night sweats, weight gain or weight loss Psychological ROS: negative for -  depression, mood  swings, physical abuse or sexual abuse.  POSITIVE-stress and anxiety Ophthalmic ROS: negative for - blurry vision, eye pain or loss of vision ENT ROS: negative for - headaches, hearing change, visual changes or vocal changes Allergy and Immunology ROS: negative for - hives, itchy/watery eyes or seasonal allergies Hematological and Lymphatic ROS: negative for - bleeding problems, bruising, swollen lymph nodes or weight loss Endocrine ROS: negative for - galactorrhea, hair pattern changes, hot flashes, malaise/lethargy, mood swings, palpitations, polydipsia/polyuria, skin changes, temperature intolerance or unexpected weight changes Breast ROS: negative for - new or changing breast lumps or nipple discharge Respiratory ROS: negative for - cough or shortness of breath Cardiovascular ROS: negative for - chest pain, irregular heartbeat, palpitations or shortness of breath Gastrointestinal ROS: no abdominal pain, change in bowel habits, or black or bloody stools Genito-Urinary ROS: no dysuria, trouble voiding, or hematuria Musculoskeletal ROS: negative for - joint pain or joint stiffness Neurological ROS: negative for - bowel and bladder control changes Dermatological ROS: negative for rash and skin lesion changes   Objective:   BP 137/74   Pulse 84   Ht 5\' 5"  (1.651 m)   Wt 152 lb 9.6 oz (69.2 kg)   BMI 25.39 kg/m  CONSTITUTIONAL: Well-developed, well-nourished female in no acute distress.  PSYCHIATRIC: Normal mood and affect. Normal behavior. Normal judgment and thought content. [place]: Alert and oriented to person, place, and time. Normal muscle tone coordination. No cranial nerve deficit noted. HENT:  Normocephalic, atraumatic EYES: Conjunctivae and EOM are normal. No scleral icterus.  NECK: Normal range of motion, supple, no masses.  Normal thyroid.  SKIN: Skin is warm and dry. No rash noted. Not diaphoretic. No erythema. No pallor. CARDIOVASCULAR: Normal heart rate noted, regular  rhythm, no murmur. RESPIRATORY: Clear to auscultation bilaterally. Effort and breath sounds normal, no problems with respiration noted. BREASTS: Symmetric in size. No masses, skin changes, nipple drainage, or lymphadenopathy. Multiple benign moles present; left breast large 3 cm mole to be addressed by dermatology ABDOMEN: Soft, normal bowel sounds, no distention noted.  No tenderness, rebound or guarding. Right lower quadrant transverse abdominal wall incision, healed BLADDER: Normal PELVIC:  External Genitalia: Atrophic changes  BUS: Urethral caruncle  Vagina: Atrophic changes  Cervix: Normal; no cervical motion tenderness  Uterus: Normal Size and shape, Nontender  Adnexa: Normal; nonpalpable and nontender  RV: External Exam NormaI  MUSCULOSKELETAL: Normal range of motion. No tenderness.  No cyanosis, clubbing, or edema.  2+ distal pulses. LYMPHATIC: No Axillary, Supraclavicular, or Inguinal Adenopathy.    Assessment:   Annual gynecologic examination 82 y.o. Contraception: post menopausal status Normal BMI History of vaginal discharge and actinomyces infection,  resolved; asymptomatic. Menopausal state, asymptomatic. History of abdominal pelvic abscess, status post colonoscopy, complication, resolved Recent history of ischemic colitis, resolved  Plan:  Pap: Not needed Mammogram: Ordered Stool Guaiac Testing:  Ordered Labs: thru pcp Routine preventative health maintenance measures emphasized: Exercise/Diet/Weight control, Tobacco Warnings, Alcohol/Substance use risks and Stress Management Continue to take Tums for calcium supplementation Smoking cessation and clinical impact on ischemic vascular disease discussed Return to [place], CMA  Brayton Mars, MD   Note: This dictation was prepared with Dragon dictation along with smaller phrase technology. Any transcriptional errors that result from this process are unintentional.

## 2016-08-21 ENCOUNTER — Ambulatory Visit (INDEPENDENT_AMBULATORY_CARE_PROVIDER_SITE_OTHER): Payer: Medicare Other | Admitting: Obstetrics and Gynecology

## 2016-08-21 ENCOUNTER — Encounter: Payer: Self-pay | Admitting: Obstetrics and Gynecology

## 2016-08-21 VITALS — BP 137/74 | HR 84 | Ht 65.0 in | Wt 152.6 lb

## 2016-08-21 DIAGNOSIS — N952 Postmenopausal atrophic vaginitis: Secondary | ICD-10-CM | POA: Diagnosis not present

## 2016-08-21 DIAGNOSIS — Z780 Asymptomatic menopausal state: Secondary | ICD-10-CM

## 2016-08-21 DIAGNOSIS — N362 Urethral caruncle: Secondary | ICD-10-CM

## 2016-08-21 DIAGNOSIS — Z1239 Encounter for other screening for malignant neoplasm of breast: Secondary | ICD-10-CM

## 2016-08-21 DIAGNOSIS — Z01419 Encounter for gynecological examination (general) (routine) without abnormal findings: Secondary | ICD-10-CM

## 2016-08-21 DIAGNOSIS — Z1211 Encounter for screening for malignant neoplasm of colon: Secondary | ICD-10-CM | POA: Diagnosis not present

## 2016-08-21 DIAGNOSIS — Z1231 Encounter for screening mammogram for malignant neoplasm of breast: Secondary | ICD-10-CM

## 2016-08-21 DIAGNOSIS — Z720 Tobacco use: Secondary | ICD-10-CM | POA: Diagnosis not present

## 2016-08-21 DIAGNOSIS — Z8742 Personal history of other diseases of the female genital tract: Secondary | ICD-10-CM

## 2016-08-21 NOTE — Patient Instructions (Signed)
1. No Pap smear needed 2. Mammogram already completed 3. Stool guaiac card testing not needed due to recent hospitalization for ischemic colitis 4. Continue with healthy eating and exercise 5. Smoking cessation encouraged 6. Return in 1 year   Health Maintenance for Postmenopausal Women Introduction Menopause is a normal process in which your reproductive ability comes to an end. This process happens gradually over a span of months to years, usually between the ages of 28 and 36. Menopause is complete when you have missed 12 consecutive menstrual periods. It is important to talk with your health care provider about some of the most common conditions that affect postmenopausal women, such as heart disease, cancer, and bone loss (osteoporosis). Adopting a healthy lifestyle and getting preventive care can help to promote your health and wellness. Those actions can also lower your chances of developing some of these common conditions. What should I know about menopause? During menopause, you may experience a number of symptoms, such as:  Moderate-to-severe hot flashes.  Night sweats.  Decrease in sex drive.  Mood swings.  Headaches.  Tiredness.  Irritability.  Memory problems.  Insomnia. Choosing to treat or not to treat menopausal changes is an individual decision that you make with your health care provider. What should I know about hormone replacement therapy and supplements? Hormone therapy products are effective for treating symptoms that are associated with menopause, such as hot flashes and night sweats. Hormone replacement carries certain risks, especially as you become older. If you are thinking about using estrogen or estrogen with progestin treatments, discuss the benefits and risks with your health care provider. What should I know about heart disease and stroke? Heart disease, heart attack, and stroke become more likely as you age. This may be due, in part, to the  hormonal changes that your body experiences during menopause. These can affect how your body processes dietary fats, triglycerides, and cholesterol. Heart attack and stroke are both medical emergencies. There are many things that you can do to help prevent heart disease and stroke:  Have your blood pressure checked at least every 1-2 years. High blood pressure causes heart disease and increases the risk of stroke.  If you are 61-81 years old, ask your health care provider if you should take aspirin to prevent a heart attack or a stroke.  Do not use any tobacco products, including cigarettes, chewing tobacco, or electronic cigarettes. If you need help quitting, ask your health care provider.  It is important to eat a healthy diet and maintain a healthy weight.  Be sure to include plenty of vegetables, fruits, low-fat dairy products, and lean protein.  Avoid eating foods that are high in solid fats, added sugars, or salt (sodium).  Get regular exercise. This is one of the most important things that you can do for your health.  Try to exercise for at least 150 minutes each week. The type of exercise that you do should increase your heart rate and make you sweat. This is known as moderate-intensity exercise.  Try to do strengthening exercises at least twice each week. Do these in addition to the moderate-intensity exercise.  Know your numbers.Ask your health care provider to check your cholesterol and your blood glucose. Continue to have your blood tested as directed by your health care provider. What should I know about cancer screening? There are several types of cancer. Take the following steps to reduce your risk and to catch any cancer development as early as possible. Breast Cancer  Practice breast self-awareness.  This means understanding how your breasts normally appear and feel.  It also means doing regular breast self-exams. Let your health care provider know about any changes,  no matter how small.  If you are 40 or older, have a clinician do a breast exam (clinical breast exam or CBE) every year. Depending on your age, family history, and medical history, it may be recommended that you also have a yearly breast X-ray (mammogram).  If you have a family history of breast cancer, talk with your health care provider about genetic screening.  If you are at high risk for breast cancer, talk with your health care provider about having an MRI and a mammogram every year.  Breast cancer (BRCA) gene test is recommended for women who have family members with BRCA-related cancers. Results of the assessment will determine the need for genetic counseling and BRCA1 and for BRCA2 testing. BRCA-related cancers include these types:  Breast. This occurs in males or females.  Ovarian.  Tubal. This may also be called fallopian tube cancer.  Cancer of the abdominal or pelvic lining (peritoneal cancer).  Prostate.  Pancreatic. Cervical, Uterine, and Ovarian Cancer  Your health care provider may recommend that you be screened regularly for cancer of the pelvic organs. These include your ovaries, uterus, and vagina. This screening involves a pelvic exam, which includes checking for microscopic changes to the surface of your cervix (Pap test).  For women ages 21-65, health care providers may recommend a pelvic exam and a Pap test every three years. For women ages 10-65, they may recommend the Pap test and pelvic exam, combined with testing for human papilloma virus (HPV), every five years. Some types of HPV increase your risk of cervical cancer. Testing for HPV may also be done on women of any age who have unclear Pap test results.  Other health care providers may not recommend any screening for nonpregnant women who are considered low risk for pelvic cancer and have no symptoms. Ask your health care provider if a screening pelvic exam is right for you.  If you have had past treatment  for cervical cancer or a condition that could lead to cancer, you need Pap tests and screening for cancer for at least 20 years after your treatment. If Pap tests have been discontinued for you, your risk factors (such as having a new sexual partner) need to be reassessed to determine if you should start having screenings again. Some women have medical problems that increase the chance of getting cervical cancer. In these cases, your health care provider may recommend that you have screening and Pap tests more often.  If you have a family history of uterine cancer or ovarian cancer, talk with your health care provider about genetic screening.  If you have vaginal bleeding after reaching menopause, tell your health care provider.  There are currently no reliable tests available to screen for ovarian cancer. Lung Cancer  Lung cancer screening is recommended for adults 79-43 years old who are at high risk for lung cancer because of a history of smoking. A yearly low-dose CT scan of the lungs is recommended if you:  Currently smoke.  Have a history of at least 30 pack-years of smoking and you currently smoke or have quit within the past 15 years. A pack-year is smoking an average of one pack of cigarettes per day for one year. Yearly screening should:  Continue until it has been 15 years since you quit.  Stop  if you develop a health problem that would prevent you from having lung cancer treatment. Colorectal Cancer  This type of cancer can be detected and can often be prevented.  Routine colorectal cancer screening usually begins at age 70 and continues through age 58.  If you have risk factors for colon cancer, your health care provider may recommend that you be screened at an earlier age.  If you have a family history of colorectal cancer, talk with your health care provider about genetic screening.  Your health care provider may also recommend using home test kits to check for hidden blood  in your stool.  A small camera at the end of a tube can be used to examine your colon directly (sigmoidoscopy or colonoscopy). This is done to check for the earliest forms of colorectal cancer.  Direct examination of the colon should be repeated every 5-10 years until age 59. However, if early forms of precancerous polyps or small growths are found or if you have a family history or genetic risk for colorectal cancer, you may need to be screened more often. Skin Cancer  Check your skin from head to toe regularly.  Monitor any moles. Be sure to tell your health care provider:  About any new moles or changes in moles, especially if there is a change in a mole's shape or color.  If you have a mole that is larger than the size of a pencil eraser.  If any of your family members has a history of skin cancer, especially at a young age, talk with your health care provider about genetic screening.  Always use sunscreen. Apply sunscreen liberally and repeatedly throughout the day.  Whenever you are outside, protect yourself by wearing long sleeves, pants, a wide-brimmed hat, and sunglasses. What should I know about osteoporosis? Osteoporosis is a condition in which bone destruction happens more quickly than new bone creation. After menopause, you may be at an increased risk for osteoporosis. To help prevent osteoporosis or the bone fractures that can happen because of osteoporosis, the following is recommended:  If you are 15-28 years old, get at least 1,000 mg of calcium and at least 600 mg of vitamin D per day.  If you are older than age 65 but younger than age 56, get at least 1,200 mg of calcium and at least 600 mg of vitamin D per day.  If you are older than age 76, get at least 1,200 mg of calcium and at least 800 mg of vitamin D per day. Smoking and excessive alcohol intake increase the risk of osteoporosis. Eat foods that are rich in calcium and vitamin D, and do weight-bearing exercises  several times each week as directed by your health care provider. What should I know about how menopause affects my mental health? Depression may occur at any age, but it is more common as you become older. Common symptoms of depression include:  Low or sad mood.  Changes in sleep patterns.  Changes in appetite or eating patterns.  Feeling an overall lack of motivation or enjoyment of activities that you previously enjoyed.  Frequent crying spells. Talk with your health care provider if you think that you are experiencing depression. What should I know about immunizations? It is important that you get and maintain your immunizations. These include:  Tetanus, diphtheria, and pertussis (Tdap) booster vaccine.  Influenza every year before the flu season begins.  Pneumonia vaccine.  Shingles vaccine. Your health care provider may also recommend other  immunizations. This information is not intended to replace advice given to you by your health care provider. Make sure you discuss any questions you have with your health care provider. Document Released: 10/12/2005 Document Revised: 03/09/2016 Document Reviewed: 05/24/2015  2017 Elsevier

## 2016-08-31 ENCOUNTER — Ambulatory Visit (INDEPENDENT_AMBULATORY_CARE_PROVIDER_SITE_OTHER): Payer: Medicare Other | Admitting: Vascular Surgery

## 2016-08-31 ENCOUNTER — Encounter (INDEPENDENT_AMBULATORY_CARE_PROVIDER_SITE_OTHER): Payer: Self-pay | Admitting: Vascular Surgery

## 2016-08-31 VITALS — BP 157/84 | HR 89 | Resp 16 | Ht 65.0 in | Wt 151.0 lb

## 2016-08-31 DIAGNOSIS — K559 Vascular disorder of intestine, unspecified: Secondary | ICD-10-CM | POA: Insufficient documentation

## 2016-08-31 DIAGNOSIS — F17210 Nicotine dependence, cigarettes, uncomplicated: Secondary | ICD-10-CM

## 2016-08-31 DIAGNOSIS — I10 Essential (primary) hypertension: Secondary | ICD-10-CM | POA: Diagnosis not present

## 2016-08-31 DIAGNOSIS — F17200 Nicotine dependence, unspecified, uncomplicated: Secondary | ICD-10-CM | POA: Insufficient documentation

## 2016-08-31 DIAGNOSIS — K219 Gastro-esophageal reflux disease without esophagitis: Secondary | ICD-10-CM | POA: Diagnosis not present

## 2016-08-31 NOTE — Assessment & Plan Note (Signed)
The patient had a recent episode of ischemic colitis but has largely recovered. We had a long discussion today regarding the pathophysiology and natural history of ischemic colitis. I recommended smoking cessation. We discussed that the majority of people ischemic colitis and small vessel disease and does not require any specific intervention. A not insignificant minority will have large vessel SMA or IMA disease that may benefit from revascularization to avoid recurrent episodes. I think it would be prudent to obtain a mesenteric duplex at her convenience in the near future. I will see her back following the study to discuss the results and determine further treatment options.

## 2016-08-31 NOTE — Patient Instructions (Signed)
Chronic Mesenteric Ischemia Mesenteric ischemia is a deficiency of blood in an area of the intestine supplied by an artery that supports the intestine. Chronic mesenteric ischemia, also called intestinal angina, is a long-term condition. It happens when an artery or vein that supports the intestine gradually becomes blocked or narrow, restricting the blood supply to the intestine. When the blood supply to the intestine is severely restricted, the intestines cannot function properly because needed oxygen cannot reach them.  CAUSES   Fatty deposits that build up in an artery or vein but have not yet restricted blood flow entirely.  Differences in some people's anatomy.  Rapid weight loss.  Weakened areas in blood vessel walls (aneurysms).  Swelling and inflammation of blood vessels (such as from fibromuscular dysplasia and arteritis).  Disorders of blood clotting.  Scarring and fibrosis of blood vessels after radiation therapy.  Blood vessel problems after drug use, such as use of cocaine. RISK FACTORS  Being female.  Being over age 50 with a history of coronary or vascular disease.  Smoking.  Congestive heart failure.  Diabetes.  High cholesterol.  High blood pressure (hypertension). SIGNS AND SYMPTOMS   Severe stomachache. Some people become fearful of eating because of pain.   Abdominal pain or cramps that develop about 30 minutes after a meal.   Abdominal pain after eating that becomes worse over time.   Diarrhea.   Nausea.   Vomiting.   Bloating.   Weight loss. DIAGNOSIS  Chronic mesenteric ischemia is often diagnosed after the person's history is taken, a physical exam is done, and tests are taken. Tests may include:  Ultrasounds.  CT scans.  Angiography. This is an imaging test that uses a dye to obtain a picture of blood flow to the intestine.  Endoscopy. This involves putting a scope through the mouth, down the throat, and into the stomach and  intestine to view the intestinal wall and take small tissue samples (biopsies).  Tonometry. In this test a tiny probe is passed through the mouth and into the stomach or intestine and left in place for 24 hours or more. It measures the output of carbon dioxide by the affected tissues. TREATMENT  Treatment may include:   Medicines to reduce blood clotting and increase blood flow.   Surgery to remove the blockage, repair arteries or veins, and restore blood flow. This may involve:   Angioplasty. This is surgery to widen the affected artery, reduce the blockage, and sometimes insert a small, mesh tube (stent).   Bypass surgery. This may be performed to bypass the blockage and reconnect healthy arteries or veins.   A stent in the affected area to help keep blocked arteries open. HOME CARE INSTRUCTIONS  Only take over-the-counter or prescription medicines as directed by your health care provider.   Keep all follow-up appointments as directed by your health care provider.   Prevent the condition from occurring by:  Doing regular exercise.  Keeping a healthy weight.  Keeping a healthy diet.  Managing cholesterol levels.  Keeping blood pressure and heart rhythm problems under control.  Not smoking. SEEK IMMEDIATE MEDICAL CARE IF:  You have severe abdominal pain.   You notice blood in your stool.   You have nausea, vomiting, or diarrhea.   You have a fever. MAKE SURE YOU:  Understand these instructions.  Will watch your condition.  Will get help right away if you are not doing well or get worse. This information is not intended to replace advice given to   you by your health care provider. Make sure you discuss any questions you have with your health care provider. Document Released: 04/09/2011 Document Revised: 04/22/2013 Document Reviewed: 02/18/2013 [employer] Interactive Patient Education  2017 [employer]  

## 2016-08-31 NOTE — Progress Notes (Signed)
Patient ID: Allison Hampton, female   DOB: [birth year], 82 y.o.   MRN: [id number]  Chief Complaint  Patient presents with  . New Evaluation    Vascular disorder    HPI Allison Hampton is a 82 y.o. female.  I am asked to see the patient by Denice Paradise for evaluation of ischemic colitis.  The patient reports One month ago an episode where she developed bloody diarrhea. She had a noncontrasted CT scan which showed significant aortoiliac and branch calcification. I have independently reviewed the CT scan. No comment can be made about the flow in the vessels as it was done without contrast. She reports after a week or 2 the abdominal pain, bloody diarrhea, and nausea all subsided. She feels well today and has no specific complaints. She says her hemoglobin count has come up to nearly normal with Iron replacement therapy. She continues to smoke.   Past Medical History:  Diagnosis Date  . Abdominal mass   . Atrophic vaginitis   . Colitis, ischemic ([place])   . GERD (gastroesophageal reflux disease)   . Pelvic pain in female   . Pyometra   . Uterine mass     Past Surgical History:  Procedure Laterality Date  . adenoids    . cataract surgery    . CHOLECYSTECTOMY    . TONSILLECTOMY      Family History  Problem Relation Age of Onset  . Heart disease Mother   . Cancer Neg Hx   . Diabetes Neg Hx   . Ovarian cancer Neg Hx   No bleeding or clotting disorders  Social History Social History  Substance Use Topics  . Smoking status: Light Tobacco Smoker    Packs/day: 0.25    Types: Cigarettes  . Smokeless tobacco: Never Used  . Alcohol use No  No IV drug use  Allergies  Allergen Reactions  . Cetirizine Other (See Comments)    tachycardia  . Ciprofloxacin   . Codeine   . Flagyl [Metronidazole]   . Iodinated Diagnostic Agents   . Moxifloxacin Hcl In Nacl Other (See Comments)    hallucination  . Penicillins     Has patient had a PCN reaction causing immediate rash,  facial/tongue/throat swelling, SOB or lightheadedness with hypotension: yes Has patient had a PCN reaction causing severe rash involving mucus membranes or skin necrosis: yes Has patient had a PCN reaction that required hospitalization no Has patient had a PCN reaction occurring within the last 10 years: no If all of the above answers are "NO", then may proceed with Cephalosporin use.   . Shellfish Allergy   . Statins Other (See Comments)    myalgias, unspecified  . Sulfa Antibiotics     Current Outpatient Prescriptions  Medication Sig Dispense Refill  . aspirin 81 MG chewable tablet Chew by mouth daily.    . diazepam (VALIUM) 5 MG tablet Take 0.5 mg by mouth daily.     Marland Kitchen docusate calcium (STOOL SOFTENER LAXATIVE DC) 240 MG capsule Take by mouth daily as needed.     . ferrous sulfate 325 (65 FE) MG tablet Take 325 mg by mouth daily with breakfast.    . fluticasone (FLONASE) 50 MCG/ACT nasal spray USE 2 SPRAYS IN EACH NOSTRIL ONCE DAILY    . Multiple Vitamins-Minerals (CENTRUM SILVER PO) Take 1 tablet by mouth daily.     Marland Kitchen omeprazole (PRILOSEC) 20 MG capsule Take by mouth.    . Probiotic Product (GNP PROBIOTIC COLON SUPPORT) CAPS  Take by mouth.     No current facility-administered medications for this visit.       REVIEW OF SYSTEMS (Negative unless checked)  Constitutional: '[]' Weight loss  '[]' Fever  '[]' Chills Cardiac: '[]' Chest pain   '[]' Chest pressure   '[]' Palpitations   '[]' Shortness of breath when laying flat   '[]' Shortness of breath at rest   '[]' Shortness of breath with exertion. Vascular:  '[]' Pain in legs with walking   '[]' Pain in legs at rest   '[]' Pain in legs when laying flat   '[]' Claudication   '[]' Pain in feet when walking  '[]' Pain in feet at rest  '[]' Pain in feet when laying flat   '[]' History of DVT   '[]' Phlebitis   '[]' Swelling in legs   '[]' Varicose veins   '[]' Non-healing ulcers Pulmonary:   '[]' Uses home oxygen   '[]' Productive cough   '[]' Hemoptysis   '[]' Wheeze  '[]' COPD   '[]' Asthma Neurologic:   '[]' Dizziness  '[]' Blackouts   '[]' Seizures   '[]' History of stroke   '[]' History of TIA  '[]' Aphasia   '[]' Temporary blindness   '[]' Dysphagia   '[]' Weakness or numbness in arms   '[]' Weakness or numbness in legs Musculoskeletal:  '[]' Arthritis   '[]' Joint swelling   '[]' Joint pain   '[]' Low back pain Hematologic:  '[]' Easy bruising  '[]' Easy bleeding   '[]' Hypercoagulable state   '[]' Anemic  '[]' Hepatitis Gastrointestinal:  '[x]' Blood in stool   '[]' Vomiting blood  '[x]' Gastroesophageal reflux/heartburn   '[]' Abdominal pain Genitourinary:  '[]' Chronic kidney disease   '[]' Difficult urination  '[]' Frequent urination  '[]' Burning with urination   '[]' Hematuria Skin:  '[]' Rashes   '[]' Ulcers   '[]' Wounds Psychological:  '[]' History of anxiety   '[]'  History of major depression.    Physical Exam BP (!) 157/84 (BP Location: Right Arm)   Pulse 89   Resp 16   Ht '5\' 5"'  (1.651 m)   Wt 151 lb (68.5 kg)   BMI 25.13 kg/m  Gen:  WD/WN, NAD. Appears much younger than stated age Head: [place]/AT, No temporalis wasting. Prominent temp pulse not noted. Ear/Nose/Throat: Hearing grossly intact, nares w/o erythema or drainage, oropharynx w/o Erythema/Exudate Eyes: Conjunctiva clear, sclera non-icteric  Neck: trachea midline.  No JVD.  Pulmonary:  Good air movement, respirations not labored, equal bilaterally.  Cardiac: RRR, normal S1, S2 Vascular:  Vessel Right Left  Radial Palpable Palpable                                   Gastrointestinal: soft, non-tender/non-distended. No guarding/reflex. No masses, surgical incisions, or scars. Musculoskeletal: M/S 5/5 throughout.  Extremities without ischemic changes.  No deformity or atrophy. No edema. Neurologic: Sensation grossly intact in extremities.  Symmetrical.  Speech is fluent. Motor exam as listed above. Psychiatric: Judgment intact, Mood & affect appropriate for pt's clinical situation. Dermatologic: No rashes or ulcers noted.  No cellulitis or open wounds. Lymph : No Cervical, Axillary, or Inguinal  lymphadenopathy.   Radiology No results found.  Labs Recent Results (from the past 2160 hour(s))  Comprehensive metabolic panel     Status: Abnormal   Collection Time: 07/17/16  7:22 AM  Result Value Ref Range   Sodium 132 (L) 135 - 145 mmol/L   Potassium 3.4 (L) 3.5 - 5.1 mmol/L   Chloride 96 (L) 101 - 111 mmol/L   CO2 24 22 - 32 mmol/L   Glucose, Bld 158 (H) 65 - 99 mg/dL   BUN 18 6 - 20 mg/dL   Creatinine, Ser 0.70 0.44 -  1.00 mg/dL   Calcium 8.7 (L) 8.9 - 10.3 mg/dL   Total Protein 6.3 (L) 6.5 - 8.1 g/dL   Albumin 3.7 3.5 - 5.0 g/dL   AST 30 15 - 41 U/L   ALT 12 (L) 14 - 54 U/L   Alkaline Phosphatase 50 38 - 126 U/L   Total Bilirubin 0.5 0.3 - 1.2 mg/dL   GFR calc non Af Amer >60 >60 mL/min   GFR calc Af Amer >60 >60 mL/min    Comment: (NOTE) The eGFR has been calculated using the CKD EPI equation. This calculation has not been validated in all clinical situations. eGFR's persistently <60 mL/min signify possible Chronic Kidney Disease.    Anion gap 12 5 - 15  CBC with Differential     Status: Abnormal   Collection Time: 07/17/16  7:22 AM  Result Value Ref Range   WBC 6.7 3.6 - 11.0 K/uL   RBC 3.00 (L) 3.80 - 5.20 MIL/uL   Hemoglobin 10.4 (L) 12.0 - 16.0 g/dL   HCT 29.4 (L) 35.0 - 47.0 %   MCV 98.0 80.0 - 100.0 fL   MCH 34.7 (H) 26.0 - 34.0 pg   MCHC 35.4 32.0 - 36.0 g/dL   RDW 12.8 11.5 - 14.5 %   Platelets 229 150 - 440 K/uL   Neutrophils Relative % 74 %   Neutro Abs 5.1 1.4 - 6.5 K/uL   Lymphocytes Relative 17 %   Lymphs Abs 1.1 1.0 - 3.6 K/uL   Monocytes Relative 7 %   Monocytes Absolute 0.4 0.2 - 0.9 K/uL   Eosinophils Relative 1 %   Eosinophils Absolute 0.1 0 - 0.7 K/uL   Basophils Relative 1 %   Basophils Absolute 0.0 0 - 0.1 K/uL  Type and screen [facility]     Status: None   Collection Time: 07/17/16  7:22 AM  Result Value Ref Range   ABO/RH(D) O POS    Antibody Screen NEG    Sample Expiration 07/20/2016    Unit Number  [id number]    Blood Component Type RBC, LR IRR    Unit division 00    Status of Unit REL FROM [facility]    Transfusion Status OK TO TRANSFUSE    Crossmatch Result Compatible   C difficile quick scan w PCR reflex     Status: None   Collection Time: 07/17/16 11:30 AM  Result Value Ref Range   C Diff antigen NEGATIVE NEGATIVE   C Diff toxin NEGATIVE NEGATIVE   C Diff interpretation No C. difficile detected.   Occult blood card to lab, stool     Status: Abnormal   Collection Time: 07/17/16 11:30 AM  Result Value Ref Range   Fecal Occult Bld POSITIVE (A) NEGATIVE  Hemoglobin and hematocrit, blood     Status: Abnormal   Collection Time: 07/17/16 11:42 AM  Result Value Ref Range   Hemoglobin 9.5 (L) 12.0 - 16.0 g/dL   HCT 26.9 (L) 35.0 - 47.0 %  Hemoglobin and hematocrit, blood     Status: Abnormal   Collection Time: 07/17/16  4:41 PM  Result Value Ref Range   Hemoglobin 9.2 (L) 12.0 - 16.0 g/dL   HCT 26.4 (L) 35.0 - 47.0 %  Hemoglobin and hematocrit, blood     Status: Abnormal   Collection Time: 07/17/16 11:06 PM  Result Value Ref Range   Hemoglobin 8.5 (L) 12.0 - 16.0 g/dL   HCT 24.8 (L) 35.0 - 47.0 %  CBC  Status: Abnormal   Collection Time: 07/18/16  5:28 AM  Result Value Ref Range   WBC 4.0 3.6 - 11.0 K/uL   RBC 2.60 (L) 3.80 - 5.20 MIL/uL   Hemoglobin 8.8 (L) 12.0 - 16.0 g/dL   HCT 25.8 (L) 35.0 - 47.0 %   MCV 99.3 80.0 - 100.0 fL   MCH 34.0 26.0 - 34.0 pg   MCHC 34.2 32.0 - 36.0 g/dL   RDW 13.1 11.5 - 14.5 %   Platelets 188 150 - 440 K/uL  Comprehensive metabolic panel     Status: Abnormal   Collection Time: 07/18/16  5:28 AM  Result Value Ref Range   Sodium 137 135 - 145 mmol/L   Potassium 4.0 3.5 - 5.1 mmol/L   Chloride 103 101 - 111 mmol/L   CO2 30 22 - 32 mmol/L   Glucose, Bld 102 (H) 65 - 99 mg/dL   BUN 11 6 - 20 mg/dL   Creatinine, Ser 0.66 0.44 - 1.00 mg/dL   Calcium 8.1 (L) 8.9 - 10.3 mg/dL   Total Protein 5.5 (L) 6.5 - 8.1 g/dL   Albumin 3.3 (L) 3.5 -  5.0 g/dL   AST 22 15 - 41 U/L   ALT 12 (L) 14 - 54 U/L   Alkaline Phosphatase 47 38 - 126 U/L   Total Bilirubin 0.4 0.3 - 1.2 mg/dL   GFR calc non Af Amer >60 >60 mL/min   GFR calc Af Amer >60 >60 mL/min    Comment: (NOTE) The eGFR has been calculated using the CKD EPI equation. This calculation has not been validated in all clinical situations. eGFR's persistently <60 mL/min signify possible Chronic Kidney Disease.    Anion gap 4 (L) 5 - 15  ABO/Rh     Status: None   Collection Time: 07/18/16  5:28 AM  Result Value Ref Range   ABO/RH(D) O POS   Prepare RBC     Status: None   Collection Time: 07/18/16  1:00 PM  Result Value Ref Range   Order Confirmation ORDER PROCESSED BY BLOOD BANK   Hemoglobin and hematocrit, blood     Status: Abnormal   Collection Time: 07/18/16  7:03 PM  Result Value Ref Range   Hemoglobin 8.2 (L) 12.0 - 16.0 g/dL   HCT 24.0 (L) 35.0 - 47.0 %  Hemoglobin and hematocrit, blood     Status: Abnormal   Collection Time: 07/19/16 12:03 AM  Result Value Ref Range   Hemoglobin 8.3 (L) 12.0 - 16.0 g/dL   HCT 23.4 (L) 35.0 - 47.0 %  Comprehensive metabolic panel     Status: Abnormal   Collection Time: 07/22/16  6:51 PM  Result Value Ref Range   Sodium 132 (L) 135 - 145 mmol/L   Potassium 3.2 (L) 3.5 - 5.1 mmol/L   Chloride 99 (L) 101 - 111 mmol/L   CO2 24 22 - 32 mmol/L   Glucose, Bld 112 (H) 65 - 99 mg/dL   BUN 8 6 - 20 mg/dL   Creatinine, Ser 0.67 0.44 - 1.00 mg/dL   Calcium 8.4 (L) 8.9 - 10.3 mg/dL   Total Protein 6.7 6.5 - 8.1 g/dL   Albumin 4.0 3.5 - 5.0 g/dL   AST 24 15 - 41 U/L   ALT 14 14 - 54 U/L   Alkaline Phosphatase 59 38 - 126 U/L   Total Bilirubin 0.8 0.3 - 1.2 mg/dL   GFR calc non Af Amer >60 >60 mL/min   GFR calc  Af Amer >60 >60 mL/min    Comment: (NOTE) The eGFR has been calculated using the CKD EPI equation. This calculation has not been validated in all clinical situations. eGFR's persistently <60 mL/min signify possible Chronic  Kidney Disease.    Anion gap 9 5 - 15  CBC     Status: Abnormal   Collection Time: 07/22/16  6:51 PM  Result Value Ref Range   WBC 5.2 3.6 - 11.0 K/uL   RBC 2.39 (L) 3.80 - 5.20 MIL/uL   Hemoglobin 8.8 (L) 12.0 - 16.0 g/dL   HCT 23.7 (L) 35.0 - 47.0 %   MCV 99.0 80.0 - 100.0 fL   MCH 36.6 (H) 26.0 - 34.0 pg   MCHC 37.0 (H) 32.0 - 36.0 g/dL   RDW 12.9 11.5 - 14.5 %   Platelets 263 150 - 440 K/uL  Urinalysis complete, with microscopic     Status: Abnormal   Collection Time: 07/22/16  7:50 PM  Result Value Ref Range   Color, Urine STRAW (A) YELLOW   APPearance CLEAR (A) CLEAR   Glucose, UA NEGATIVE NEGATIVE mg/dL   Bilirubin Urine NEGATIVE NEGATIVE   Ketones, ur TRACE (A) NEGATIVE mg/dL   Specific Gravity, Urine 1.002 (L) 1.005 - 1.030   Hgb urine dipstick NEGATIVE NEGATIVE   pH 6.0 5.0 - 8.0   Protein, ur NEGATIVE NEGATIVE mg/dL   Nitrite NEGATIVE NEGATIVE   Leukocytes, UA TRACE (A) NEGATIVE   RBC / HPF 0-5 0 - 5 RBC/hpf   WBC, UA 0-5 0 - 5 WBC/hpf   Bacteria, UA NONE SEEN NONE SEEN   Squamous Epithelial / LPF NONE SEEN NONE SEEN  Urine culture     Status: Abnormal   Collection Time: 07/22/16  7:50 PM  Result Value Ref Range   Specimen Description URINE, RANDOM    Special Requests NONE    Culture (A)     <10,000 COLONIES/mL INSIGNIFICANT GROWTH Performed at [facility]    Report Status 07/24/2016 FINAL     Assessment/Plan:  GERD (gastroesophageal reflux disease) On Prilosec  Benign essential HTN blood pressure control important in reducing the progression of atherosclerotic disease. On appropriate oral medications.   Tobacco use disorder We had a discussion for approximately 4 minutes regarding the absolute need for smoking cessation due to the deleterious nature of tobacco on the vascular system. We discussed the tobacco use would diminish patency of any intervention, and likely significantly worsen progressio of disease. We discussed multiple agents for  quitting including replacement therapy or medications to reduce cravings such as Chantix. The patient voices their understanding of the importance of smoking cessation.   Ischemic colitis [facility]) The patient had a recent episode of ischemic colitis but has largely recovered. We had a long discussion today regarding the pathophysiology and natural history of ischemic colitis. I recommended smoking cessation. We discussed that the majority of people ischemic colitis and small vessel disease and does not require any specific intervention. A not insignificant minority will have large vessel SMA or IMA disease that may benefit from revascularization to avoid recurrent episodes. I think it would be prudent to obtain a mesenteric duplex at her convenience in the near future. I will see her back following the study to discuss the results and determine further treatment options.      Leotis Pain 08/31/2016, 2:34 PM   This note was created with Dragon medical transcription system.  Any errors from dictation are unintentional.

## 2016-08-31 NOTE — Assessment & Plan Note (Signed)
We had a discussion for approximately 4 minutes regarding the absolute need for smoking cessation due to the deleterious nature of tobacco on the vascular system. We discussed the tobacco use would diminish patency of any intervention, and likely significantly worsen progressio of disease. We discussed multiple agents for quitting including replacement therapy or medications to reduce cravings such as Chantix. The patient voices their understanding of the importance of smoking cessation.  

## 2016-08-31 NOTE — Assessment & Plan Note (Signed)
On Prilosec

## 2016-08-31 NOTE — Assessment & Plan Note (Signed)
blood pressure control important in reducing the progression of atherosclerotic disease. On appropriate oral medications.  

## 2016-09-11 ENCOUNTER — Ambulatory Visit (INDEPENDENT_AMBULATORY_CARE_PROVIDER_SITE_OTHER): Payer: Medicare Other

## 2016-09-11 ENCOUNTER — Ambulatory Visit (INDEPENDENT_AMBULATORY_CARE_PROVIDER_SITE_OTHER): Payer: Medicare Other | Admitting: Vascular Surgery

## 2016-09-11 ENCOUNTER — Encounter (INDEPENDENT_AMBULATORY_CARE_PROVIDER_SITE_OTHER): Payer: Self-pay | Admitting: Vascular Surgery

## 2016-09-11 VITALS — BP 151/84 | HR 84 | Resp 16 | Wt 148.0 lb

## 2016-09-11 DIAGNOSIS — K219 Gastro-esophageal reflux disease without esophagitis: Secondary | ICD-10-CM

## 2016-09-11 DIAGNOSIS — K559 Vascular disorder of intestine, unspecified: Secondary | ICD-10-CM | POA: Diagnosis not present

## 2016-09-11 DIAGNOSIS — F17200 Nicotine dependence, unspecified, uncomplicated: Secondary | ICD-10-CM | POA: Diagnosis not present

## 2016-09-11 NOTE — Progress Notes (Signed)
Subjective:    Patient ID: Allison Hampton, female    DOB: 11-16-1927, 83 y.o.   MRN: [id number] Chief Complaint  Patient presents with  . Follow-up   Patient presents to review vascular studies. Patient last seen on 08/28/16 to rule out mesenteric ischemia. The patient is without complaint since last visit and denies any symptoms such as post prandial pain, weight loss, fear of eating, nausea, vomiting, constipation or diarrhea. The patient underwent a mesenteric artery duplex which was notable for no celiac, SMA or IMA stenosis.    Review of Systems  Constitutional: Negative.   HENT: Negative.   Eyes: Negative.   Respiratory: Negative.   Cardiovascular: Negative.   Gastrointestinal: Negative.   Endocrine: Negative.   Genitourinary: Negative.   Musculoskeletal: Negative.   Skin: Negative.   Allergic/Immunologic: Negative.   Neurological: Negative.   Hematological: Negative.   Psychiatric/Behavioral: Negative.        Objective:   Physical Exam Gen:  WD/WN, NAD. Appears much younger than stated age Head: [place]/AT, No temporalis wasting. Prominent temp pulse not noted. Ear/Nose/Throat: Hearing grossly intact, nares w/o erythema or drainage, oropharynx w/o Erythema/Exudate Eyes: Conjunctiva clear, sclera non-icteric  Neck: trachea midline.  No JVD.  Pulmonary:  Good air movement, respirations not labored, equal bilaterally.  Cardiac: RRR, normal S1, S2 Vascular:  Vessel Right Left  Radial Palpable Palpable                                   Gastrointestinal: soft, non-tender/non-distended. No guarding/reflex. No masses, surgical incisions, or scars. Musculoskeletal: M/S 5/5 throughout.  Extremities without ischemic changes.  No deformity or atrophy. No edema. Neurologic: Sensation grossly intact in extremities.  Symmetrical.  Speech is fluent. Motor exam as listed above. Psychiatric: Judgment intact, Mood & affect appropriate for pt's  clinical situation. Dermatologic: No rashes or ulcers noted.  No cellulitis or open wounds. Lymph : No Cervical, Axillary, or Inguinal lymphadenopathy.  BP (!) 151/84   Pulse 84   Resp 16   Wt 148 lb (67.1 kg)   BMI 24.63 kg/m   Past Medical History:  Diagnosis Date  . Abdominal mass   . Atrophic vaginitis   . Colitis, ischemic ([place])   . GERD (gastroesophageal reflux disease)   . Pelvic pain in female   . Pyometra   . Uterine mass    Social History   Social History  . Marital status: Widowed    Spouse name: N/A  . Number of children: N/A  . Years of education: N/A   Occupational History  . Not on file.   Social History Main Topics  . Smoking status: Light Tobacco Smoker    Packs/day: 0.25    Types: Cigarettes  . Smokeless tobacco: Never Used  . Alcohol use No  . Drug use: No  . Sexual activity: Not Currently    Birth control/ protection: Post-menopausal   Other Topics Concern  . Not on file   Social History Narrative  . No narrative on file   Past Surgical History:  Procedure Laterality Date  . adenoids    . cataract surgery    . CHOLECYSTECTOMY    . TONSILLECTOMY     Family History  Problem Relation Age of Onset  . Heart disease Mother   . Cancer Neg Hx   . Diabetes Neg Hx   . Ovarian cancer Neg Hx    Allergies  Allergen  Reactions  . Cetirizine Other (See Comments)    tachycardia  . Ciprofloxacin   . Codeine   . Flagyl [Metronidazole]   . Iodinated Diagnostic Agents   . Moxifloxacin Hcl In Nacl Other (See Comments)    hallucination  . Penicillins     Has patient had a PCN reaction causing immediate rash, facial/tongue/throat swelling, SOB or lightheadedness with hypotension: yes Has patient had a PCN reaction causing severe rash involving mucus membranes or skin necrosis: yes Has patient had a PCN reaction that required hospitalization no Has patient had a PCN reaction occurring within the last 10 years: no If all of the above answers are  "NO", then may proceed with Cephalosporin use.   . Shellfish Allergy   . Statins Other (See Comments)    myalgias, unspecified  . Sulfa Antibiotics        Assessment & Plan:  Patient presents to review vascular studies. Patient last seen on 08/28/16 to rule out mesenteric ischemia. The patient is without complaint since last visit and denies any symptoms such as post prandial pain, weight loss, fear of eating, nausea, vomiting, constipation or diarrhea. The patient underwent a mesenteric artery duplex which was notable for no celiac, SMA or IMA stenosis.   1. Ischemic colitis ([place]) - Improved No mesenteric stenosis noted on duplex.   I have discussed with the patient at length the risk factors for and pathogenesis of atherosclerotic disease and encouraged a healthy diet, regular exercise regimen and blood pressure / glucose control. The patient was encouraged to call the office in the interim if she experiences post prandial pain, weight loss, fear of eating, nausea, vomiting, constipation or diarrhea. The patient expresses their understanding.  2. Tobacco use disorder - Stable I have discussed (approximately 5 minutes) with the patient the role of tobacco in the pathogenesis of atherosclerosis and its effect on the progression of the disease, impact on the durability of interventions and its limitations on the formation of collateral pathways. I have recommended absolute tobacco cessation. I have discussed various options available for assistance with tobacco cessation including over the counter methods (Nicotine gum, patch and lozenges). We also discussed prescription options (Chantix, Nicotine Inhaler / Nasal Spray). The patient is not interested in pursuing any prescription tobacco cessation options at this time. The patient voices their understanding.   3. Gastroesophageal reflux disease, esophagitis presence not specified - Stable On Prilosec.  Current Outpatient Prescriptions on File  Prior to Visit  Medication Sig Dispense Refill  . aspirin 81 MG chewable tablet Chew by mouth daily.    . diazepam (VALIUM) 5 MG tablet Take 0.5 mg by mouth daily.     Marland Kitchen docusate calcium (STOOL SOFTENER LAXATIVE DC) 240 MG capsule Take by mouth daily as needed.     . ferrous sulfate 325 (65 FE) MG tablet Take 325 mg by mouth daily with breakfast.    . fluticasone (FLONASE) 50 MCG/ACT nasal spray USE 2 SPRAYS IN EACH NOSTRIL ONCE DAILY    . Multiple Vitamins-Minerals (CENTRUM SILVER PO) Take 1 tablet by mouth daily.     Marland Kitchen omeprazole (PRILOSEC) 20 MG capsule Take by mouth.    . Probiotic Product (GNP PROBIOTIC COLON SUPPORT) CAPS Take by mouth.     No current facility-administered medications on file prior to visit.     There are no Patient Instructions on file for this visit. No Follow-up on file.   Ian Castagna A Raymon Schlarb, PA-C

## 2017-08-19 NOTE — Progress Notes (Signed)
Who patient ID: Allison Hampton, female   DOB: 1928/02/10, 83 y.o.   MRN: [id number] ANNUAL PREVENTATIVE CARE GYN  ENCOUNTER NOTE  Subjective:       Allison Hampton is a 83 y.o. G2P1001 female here for a routine annual gynecologic exam.  Current complaints: 1. Menopause, asymptomatic 2. Past history of pyometra, resolved  Bowel function and bladder function or reportedly normal.  She is not experiencing any vaginal bleeding or vaginal discharge.  Overall health has been stable this year except for life stressors due to family death and family relationships (daughter and grandson)  Gynecologic History No LMP recorded. Patient is postmenopausal. Contraception: post menopausal status Last Pap: unsure last one. Results were: normal Last mammogram:06/26/2016 birad 1. Results were: normal- dense  Obstetric History OB History  Gravida Para Term Preterm AB Living  1 1 1     1   SAB TAB Ectopic Multiple Live Births          1    # Outcome Date GA Lbr Len/2nd Weight Sex Delivery Anes PTL Lv  1 Term 1963   8 lb 9.6 oz (3.901 kg) F Vag-Spont   LIV      Past Medical History:  Diagnosis Date  . Abdominal mass   . Atrophic vaginitis   . Colitis, ischemic ([place])   . GERD (gastroesophageal reflux disease)   . Pelvic pain in female   . Pyometra   . Uterine mass     Past Surgical History:  Procedure Laterality Date  . adenoids    . cataract surgery    . CHOLECYSTECTOMY    . TONSILLECTOMY      Current Outpatient Medications on File Prior to Visit  Medication Sig Dispense Refill  . aspirin 81 MG chewable tablet Chew by mouth daily.    . diazepam (VALIUM) 5 MG tablet Take 0.5 mg by mouth daily.     Marland Kitchen docusate calcium (STOOL SOFTENER LAXATIVE DC) 240 MG capsule Take by mouth daily as needed.     . ferrous sulfate 325 (65 FE) MG tablet Take 325 mg by mouth daily with breakfast.    . fluticasone (FLONASE) 50 MCG/ACT nasal spray USE 2 SPRAYS IN EACH NOSTRIL ONCE DAILY    .  Multiple Vitamins-Minerals (CENTRUM SILVER PO) Take 1 tablet by mouth daily.     . Probiotic Product (GNP PROBIOTIC COLON SUPPORT) CAPS Take by mouth.     No current facility-administered medications on file prior to visit.     Allergies  Allergen Reactions  . Cetirizine Other (See Comments)    tachycardia  . Ciprofloxacin   . Codeine   . Flagyl [Metronidazole]   . Iodinated Diagnostic Agents   . Moxifloxacin Hcl In Nacl Other (See Comments)    hallucination  . Penicillins     Has patient had a PCN reaction causing immediate rash, facial/tongue/throat swelling, SOB or lightheadedness with hypotension: yes Has patient had a PCN reaction causing severe rash involving mucus membranes or skin necrosis: yes Has patient had a PCN reaction that required hospitalization no Has patient had a PCN reaction occurring within the last 10 years: no If all of the above answers are "NO", then may proceed with Cephalosporin use.   . Shellfish Allergy   . Statins Other (See Comments)    myalgias, unspecified  . Sulfa Antibiotics     Social History   Socioeconomic History  . Marital status: Widowed    Spouse name: Not on file  . Number  of children: Not on file  . Years of education: Not on file  . Highest education level: Not on file  Social Needs  . Financial resource strain: Not on file  . Food insecurity - worry: Not on file  . Food insecurity - inability: Not on file  . Transportation needs - medical: Not on file  . Transportation needs - non-medical: Not on file  Occupational History  . Not on file  Tobacco Use  . Smoking status: Light Tobacco Smoker    Packs/day: 0.25    Types: Cigarettes  . Smokeless tobacco: Never Used  Substance and Sexual Activity  . Alcohol use: No  . Drug use: No  . Sexual activity: Not Currently    Birth control/protection: Post-menopausal  Other Topics Concern  . Not on file  Social History Narrative  . Not on file    Family History  Problem  Relation Age of Onset  . Heart disease Mother   . Cancer Neg Hx   . Diabetes Neg Hx   . Ovarian cancer Neg Hx     The following portions of the patient's history were reviewed and updated as appropriate: allergies, current medications, past family history, past medical history, past social history, past surgical history and problem list.  Review of Systems Review of Systems  Constitutional: Negative.   HENT: Negative.   Eyes: Negative.   Respiratory: Negative.   Cardiovascular: Negative.   Gastrointestinal: Negative.   Genitourinary: Negative.   Musculoskeletal: Negative.   Skin: Negative.   Neurological: Negative.   Endo/Heme/Allergies: Negative.   Psychiatric/Behavioral: Negative.      Objective:  BP 122/68   Pulse 86   Ht 5\' 5"  (1.651 m)   Wt 150 lb 1.6 oz (68.1 kg)   BMI 24.98 kg/m   CONSTITUTIONAL: Well-developed, well-nourished female in no acute distress.  PSYCHIATRIC: Normal mood and affect. Normal behavior. Normal judgment and thought content. [place]: Alert and oriented to person, place, and time. Normal muscle tone coordination. No cranial nerve deficit noted. HENT:  Normocephalic, atraumatic EYES: Conjunctivae and EOM are normal. No scleral icterus.  NECK: Normal range of motion, supple, no masses.  Normal thyroid.  SKIN: Skin is warm and dry. No rash noted. Not diaphoretic. No erythema. No pallor.  Multiple seborrheic keratoses-diffuse CARDIOVASCULAR: Normal heart rate noted, regular rhythm, no murmur. RESPIRATORY: Clear to auscultation bilaterally. Effort and breath sounds normal, no problems with respiration noted. BREASTS: Symmetric in size. No masses, skin changes, nipple drainage, or lymphadenopathy. Multiple benign moles present ABDOMEN: Soft, normal bowel sounds, no distention noted.  No tenderness, rebound or guarding. Right lower quadrant transverse abdominal wall incision, healed BLADDER: Normal PELVIC:  External Genitalia: Atrophic  changes  BUS: Urethral caruncle  Vagina: Atrophic changes; good vault support   Cervix: Normal; no cervical motion tenderness  Uterus: Normal Size and shape, Nontender  Adnexa: Normal; nonpalpable and nontender  RV: External Exam NormaI, No Rectal Masses and Normal Sphincter tone  MUSCULOSKELETAL: Normal range of motion. No tenderness.  No cyanosis, clubbing, or edema.  2+ distal pulses. LYMPHATIC: No Axillary, Supraclavicular, or Inguinal Adenopathy.    Assessment:   Annual gynecologic examination 83 y.o. Contraception: post menopausal status Normal BMI History of vaginal discharge and actinomyces infection, resolved; asymptomatic. Menopausal state, asymptomatic. History of abdominal pelvic abscess, status post colonoscopy, complication, resolved History of ischemic colitis, resolved without recurrence Tobacco user  Plan:  Pap: Not needed Mammogram: Not Indicated Stool Guaiac Testing:  Not Indicated Labs: thru pcp Routine preventative  health maintenance measures emphasized: Exercise/Diet/Weight control, Tobacco Warnings, Alcohol/Substance use risks and Stress Management Continue to take Tums for calcium supplementation GI follow-up with Dawson Bills as scheduled Smoking cessation and clinical impact on ischemic vascular disease discussed Return to [place], CMA  Brayton Mars, MD  Note: This dictation was prepared with Dragon dictation along with smaller phrase technology. Any transcriptional errors that result from this process are unintentional.

## 2017-08-22 ENCOUNTER — Ambulatory Visit (INDEPENDENT_AMBULATORY_CARE_PROVIDER_SITE_OTHER): Payer: Medicare Other | Admitting: Obstetrics and Gynecology

## 2017-08-22 ENCOUNTER — Encounter: Payer: Self-pay | Admitting: Obstetrics and Gynecology

## 2017-08-22 VITALS — BP 122/68 | HR 86 | Ht 65.0 in | Wt 150.1 lb

## 2017-08-22 DIAGNOSIS — Z1211 Encounter for screening for malignant neoplasm of colon: Secondary | ICD-10-CM | POA: Diagnosis not present

## 2017-08-22 DIAGNOSIS — Z01419 Encounter for gynecological examination (general) (routine) without abnormal findings: Secondary | ICD-10-CM | POA: Diagnosis not present

## 2017-08-22 DIAGNOSIS — Z1239 Encounter for other screening for malignant neoplasm of breast: Secondary | ICD-10-CM

## 2017-08-22 DIAGNOSIS — Z1231 Encounter for screening mammogram for malignant neoplasm of breast: Secondary | ICD-10-CM

## 2017-08-22 DIAGNOSIS — Z780 Asymptomatic menopausal state: Secondary | ICD-10-CM

## 2017-08-22 DIAGNOSIS — N362 Urethral caruncle: Secondary | ICD-10-CM

## 2017-08-22 DIAGNOSIS — Z720 Tobacco use: Secondary | ICD-10-CM

## 2017-08-22 DIAGNOSIS — N952 Postmenopausal atrophic vaginitis: Secondary | ICD-10-CM

## 2017-08-22 NOTE — Patient Instructions (Signed)
1.  No Pap smear is needed 2.  Breast exam is normal; mammogram is deferred until abnormality on clinical exam is identified 3.  Colon cancer screening is discontinued unless GI symptoms develop; patient to follow-up with Dawson Bills in GI 4.  Continue with healthy eating and exercise as tolerated 5.  Recommend gradual reduction in tobacco usage 6.  Return in 1 year for a physical exam.   Health Maintenance for Postmenopausal Women Menopause is a normal process in which your reproductive ability comes to an end. This process happens gradually over a span of months to years, usually between the ages of 32 and 15. Menopause is complete when you have missed 12 consecutive menstrual periods. It is important to talk with your health care provider about some of the most common conditions that affect postmenopausal women, such as heart disease, cancer, and bone loss (osteoporosis). Adopting a healthy lifestyle and getting preventive care can help to promote your health and wellness. Those actions can also lower your chances of developing some of these common conditions. What should I know about menopause? During menopause, you may experience a number of symptoms, such as:  Moderate-to-severe hot flashes.  Night sweats.  Decrease in sex drive.  Mood swings.  Headaches.  Tiredness.  Irritability.  Memory problems.  Insomnia.  Choosing to treat or not to treat menopausal changes is an individual decision that you make with your health care provider. What should I know about hormone replacement therapy and supplements? Hormone therapy products are effective for treating symptoms that are associated with menopause, such as hot flashes and night sweats. Hormone replacement carries certain risks, especially as you become older. If you are thinking about using estrogen or estrogen with progestin treatments, discuss the benefits and risks with your health care provider. What should I know about  heart disease and stroke? Heart disease, heart attack, and stroke become more likely as you age. This may be due, in part, to the hormonal changes that your body experiences during menopause. These can affect how your body processes dietary fats, triglycerides, and cholesterol. Heart attack and stroke are both medical emergencies. There are many things that you can do to help prevent heart disease and stroke:  Have your blood pressure checked at least every 1-2 years. High blood pressure causes heart disease and increases the risk of stroke.  If you are 48-61 years old, ask your health care provider if you should take aspirin to prevent a heart attack or a stroke.  Do not use any tobacco products, including cigarettes, chewing tobacco, or electronic cigarettes. If you need help quitting, ask your health care provider.  It is important to eat a healthy diet and maintain a healthy weight. ? Be sure to include plenty of vegetables, fruits, low-fat dairy products, and lean protein. ? Avoid eating foods that are high in solid fats, added sugars, or salt (sodium).  Get regular exercise. This is one of the most important things that you can do for your health. ? Try to exercise for at least 150 minutes each week. The type of exercise that you do should increase your heart rate and make you sweat. This is known as moderate-intensity exercise. ? Try to do strengthening exercises at least twice each week. Do these in addition to the moderate-intensity exercise.  Know your numbers.Ask your health care provider to check your cholesterol and your blood glucose. Continue to have your blood tested as directed by your health care provider.  What should  I know about cancer screening? There are several types of cancer. Take the following steps to reduce your risk and to catch any cancer development as early as possible. Breast Cancer  Practice breast self-awareness. ? This means understanding how your  breasts normally appear and feel. ? It also means doing regular breast self-exams. Let your health care provider know about any changes, no matter how small.  If you are 33 or older, have a clinician do a breast exam (clinical breast exam or CBE) every year. Depending on your age, family history, and medical history, it may be recommended that you also have a yearly breast X-ray (mammogram).  If you have a family history of breast cancer, talk with your health care provider about genetic screening.  If you are at high risk for breast cancer, talk with your health care provider about having an MRI and a mammogram every year.  Breast cancer (BRCA) gene test is recommended for women who have family members with BRCA-related cancers. Results of the assessment will determine the need for genetic counseling and BRCA1 and for BRCA2 testing. BRCA-related cancers include these types: ? Breast. This occurs in males or females. ? Ovarian. ? Tubal. This may also be called fallopian tube cancer. ? Cancer of the abdominal or pelvic lining (peritoneal cancer). ? Prostate. ? Pancreatic.  Cervical, Uterine, and Ovarian Cancer Your health care provider may recommend that you be screened regularly for cancer of the pelvic organs. These include your ovaries, uterus, and vagina. This screening involves a pelvic exam, which includes checking for microscopic changes to the surface of your cervix (Pap test).  For women ages 21-65, health care providers may recommend a pelvic exam and a Pap test every three years. For women ages 41-65, they may recommend the Pap test and pelvic exam, combined with testing for human papilloma virus (HPV), every five years. Some types of HPV increase your risk of cervical cancer. Testing for HPV may also be done on women of any age who have unclear Pap test results.  Other health care providers may not recommend any screening for nonpregnant women who are considered low risk for pelvic  cancer and have no symptoms. Ask your health care provider if a screening pelvic exam is right for you.  If you have had past treatment for cervical cancer or a condition that could lead to cancer, you need Pap tests and screening for cancer for at least 20 years after your treatment. If Pap tests have been discontinued for you, your risk factors (such as having a new sexual partner) need to be reassessed to determine if you should start having screenings again. Some women have medical problems that increase the chance of getting cervical cancer. In these cases, your health care provider may recommend that you have screening and Pap tests more often.  If you have a family history of uterine cancer or ovarian cancer, talk with your health care provider about genetic screening.  If you have vaginal bleeding after reaching menopause, tell your health care provider.  There are currently no reliable tests available to screen for ovarian cancer.  Lung Cancer Lung cancer screening is recommended for adults 21-68 years old who are at high risk for lung cancer because of a history of smoking. A yearly low-dose CT scan of the lungs is recommended if you:  Currently smoke.  Have a history of at least 30 pack-years of smoking and you currently smoke or have quit within the past 15 years.  A pack-year is smoking an average of one pack of cigarettes per day for one year.  Yearly screening should:  Continue until it has been 15 years since you quit.  Stop if you develop a health problem that would prevent you from having lung cancer treatment.  Colorectal Cancer  This type of cancer can be detected and can often be prevented.  Routine colorectal cancer screening usually begins at age 17 and continues through age 30.  If you have risk factors for colon cancer, your health care provider may recommend that you be screened at an earlier age.  If you have a family history of colorectal cancer, talk with  your health care provider about genetic screening.  Your health care provider may also recommend using home test kits to check for hidden blood in your stool.  A small camera at the end of a tube can be used to examine your colon directly (sigmoidoscopy or colonoscopy). This is done to check for the earliest forms of colorectal cancer.  Direct examination of the colon should be repeated every 5-10 years until age 42. However, if early forms of precancerous polyps or small growths are found or if you have a family history or genetic risk for colorectal cancer, you may need to be screened more often.  Skin Cancer  Check your skin from head to toe regularly.  Monitor any moles. Be sure to tell your health care provider: ? About any new moles or changes in moles, especially if there is a change in a mole's shape or color. ? If you have a mole that is larger than the size of a pencil eraser.  If any of your family members has a history of skin cancer, especially at a young age, talk with your health care provider about genetic screening.  Always use sunscreen. Apply sunscreen liberally and repeatedly throughout the day.  Whenever you are outside, protect yourself by wearing long sleeves, pants, a wide-brimmed hat, and sunglasses.  What should I know about osteoporosis? Osteoporosis is a condition in which bone destruction happens more quickly than new bone creation. After menopause, you may be at an increased risk for osteoporosis. To help prevent osteoporosis or the bone fractures that can happen because of osteoporosis, the following is recommended:  If you are 80-51 years old, get at least 1,000 mg of calcium and at least 600 mg of vitamin D per day.  If you are older than age 16 but younger than age 22, get at least 1,200 mg of calcium and at least 600 mg of vitamin D per day.  If you are older than age 43, get at least 1,200 mg of calcium and at least 800 mg of vitamin D per  day.  Smoking and excessive alcohol intake increase the risk of osteoporosis. Eat foods that are rich in calcium and vitamin D, and do weight-bearing exercises several times each week as directed by your health care provider. What should I know about how menopause affects my mental health? Depression may occur at any age, but it is more common as you become older. Common symptoms of depression include:  Low or sad mood.  Changes in sleep patterns.  Changes in appetite or eating patterns.  Feeling an overall lack of motivation or enjoyment of activities that you previously enjoyed.  Frequent crying spells.  Talk with your health care provider if you think that you are experiencing depression. What should I know about immunizations? It is important that you  get and maintain your immunizations. These include:  Tetanus, diphtheria, and pertussis (Tdap) booster vaccine.  Influenza every year before the flu season begins.  Pneumonia vaccine.  Shingles vaccine.  Your health care provider may also recommend other immunizations. This information is not intended to replace advice given to you by your health care provider. Make sure you discuss any questions you have with your health care provider. Document Released: 10/12/2005 Document Revised: 03/09/2016 Document Reviewed: 05/24/2015 Elsevier Interactive Patient Education  2018 [employer].

## 2017-11-12 IMAGING — CT CT ABD-PELV W/O CM
2 of 4 series · 16 of 46 positions shown, 18 images · non-contrast
Comparison: CT Abdomen and Pelvis with oral contrast 04/21/2014 and
earlier.

CLINICAL DATA: 88-year-old female with right lower quadrant pain,
recent hospitalization due to GI bleed. Initial encounter. Contrast
allergy.

EXAM:
CT ABDOMEN AND PELVIS WITHOUT CONTRAST
TECHNIQUE: Multidetector CT imaging of the abdomen and pelvis was performed
following the standard protocol without IV contrast.

[Series 2: routine soft tissue · axial · 0.79mm/px · z∈[-443,-93]mm · 13 of 78 slices shown, 15 images]
[im 4/78  soft-tissue]
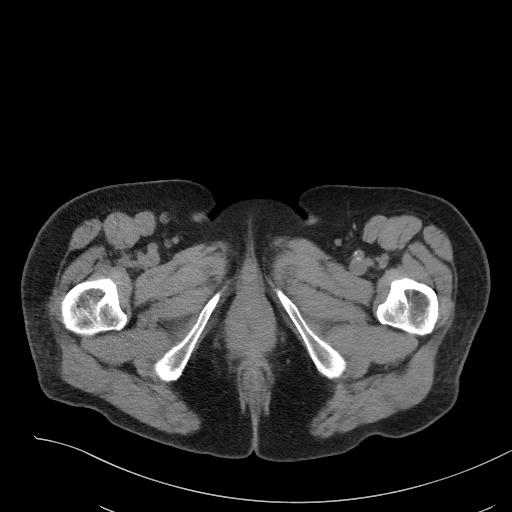
[im 4/78  bone]
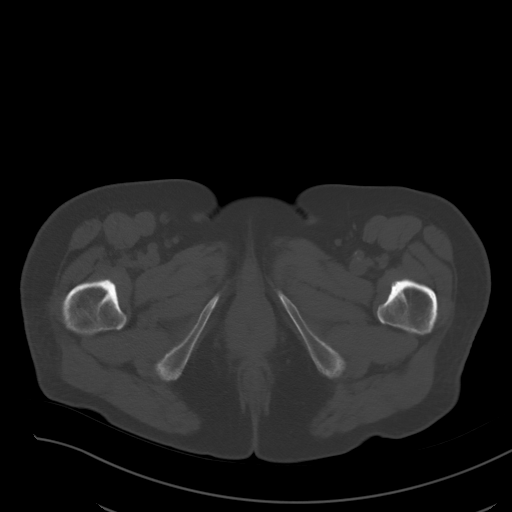
[im 10/78  soft-tissue]
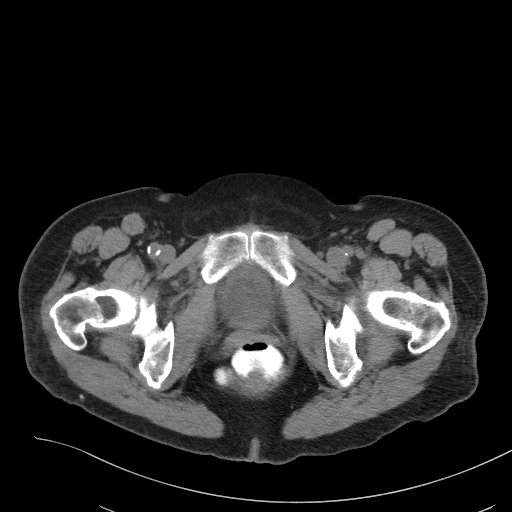
[im 17/78  soft-tissue]
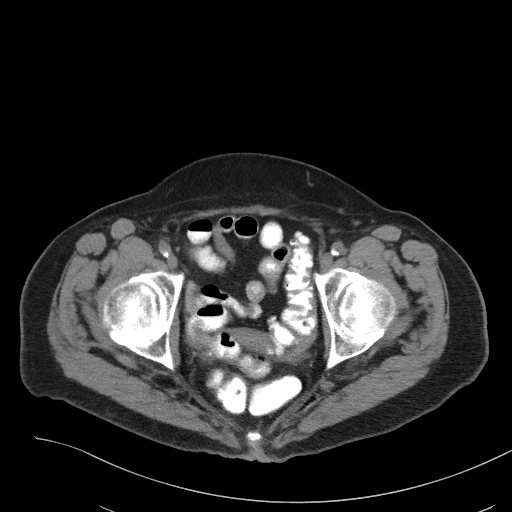
[im 23/78  soft-tissue]
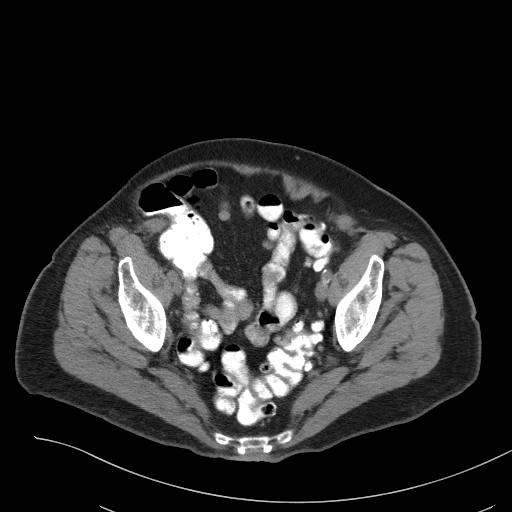
[im 26/78  soft-tissue]
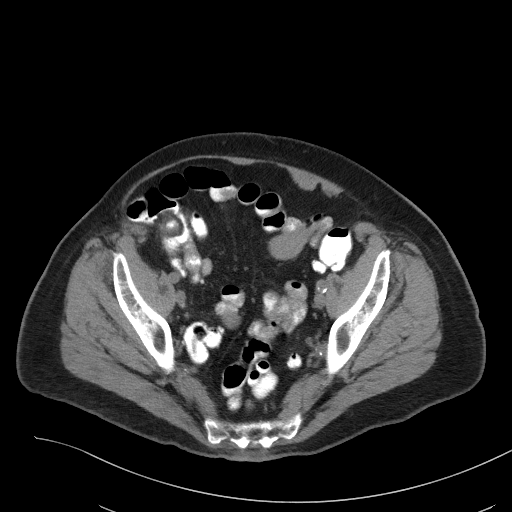
[im 33/78  soft-tissue]
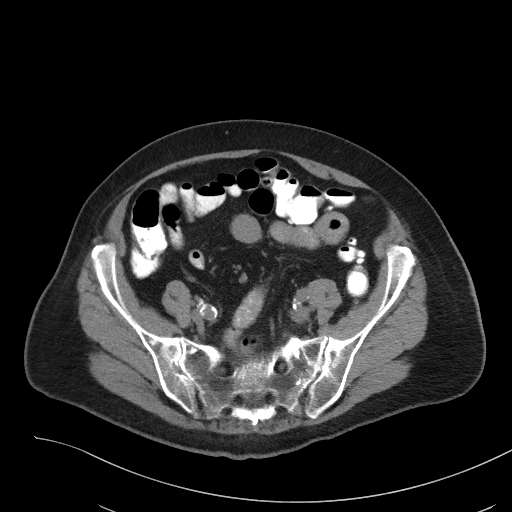
[im 39/78  soft-tissue]
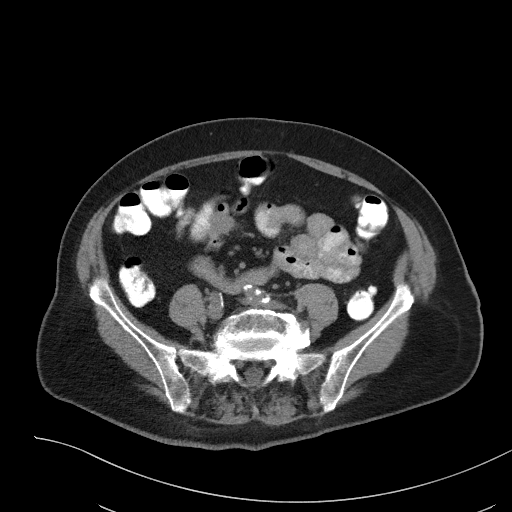
[im 45/78  soft-tissue]
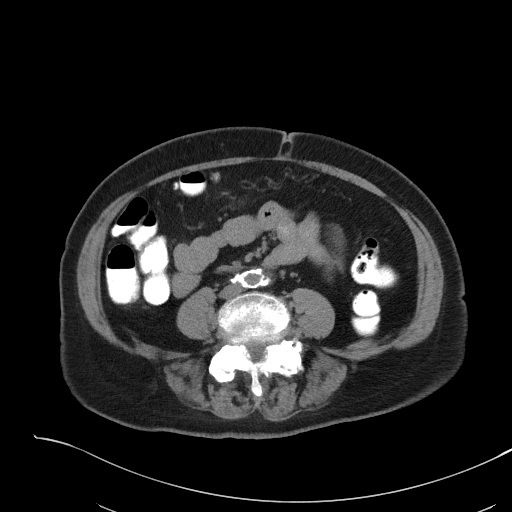
[im 52/78  soft-tissue]
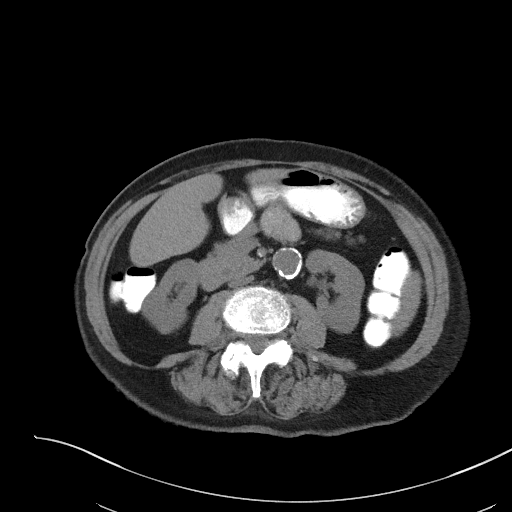
[im 52/78  bone]
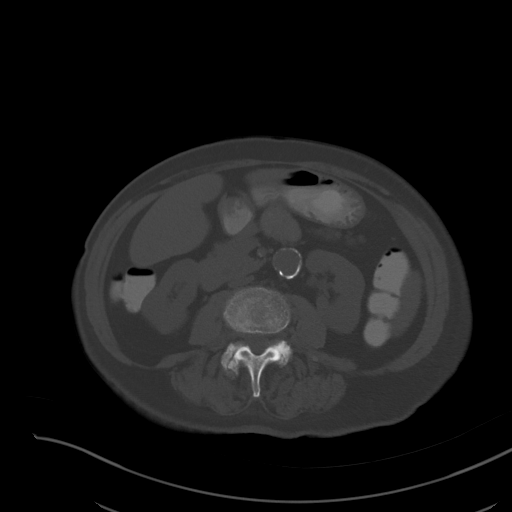
[im 55/78  soft-tissue]
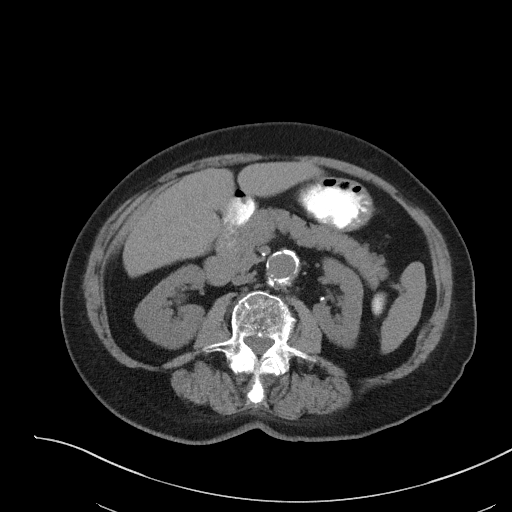
[im 61/78  soft-tissue]
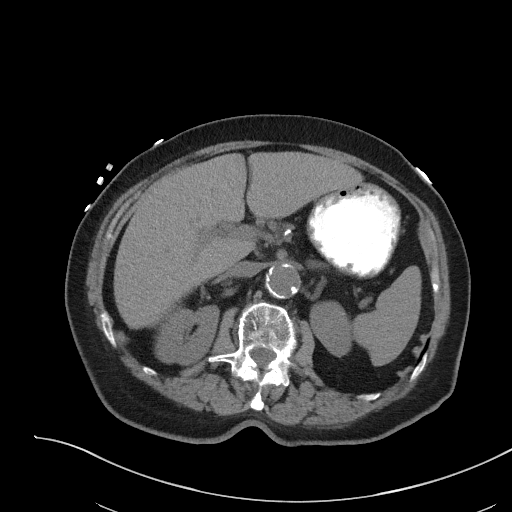
[im 68/78  soft-tissue]
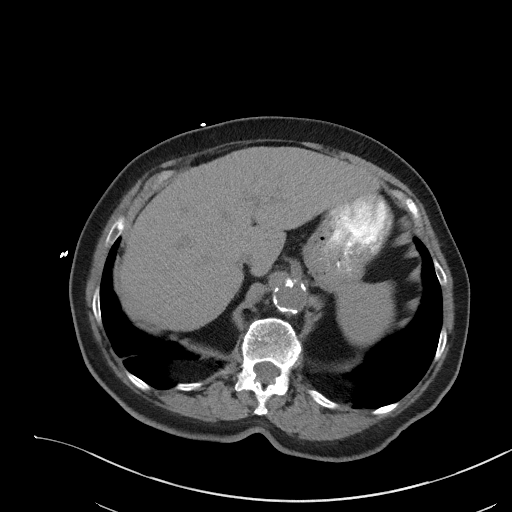
[im 74/78  soft-tissue]
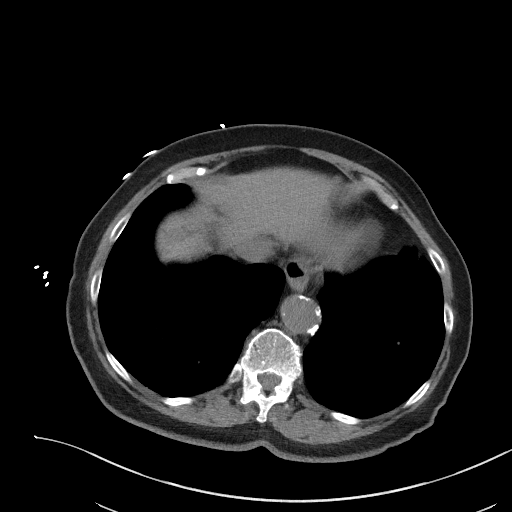

[Series 5: coronal st · coronal · 0.71mm/px · 3 of 86 slices shown]
[im 29/86  soft-tissue]
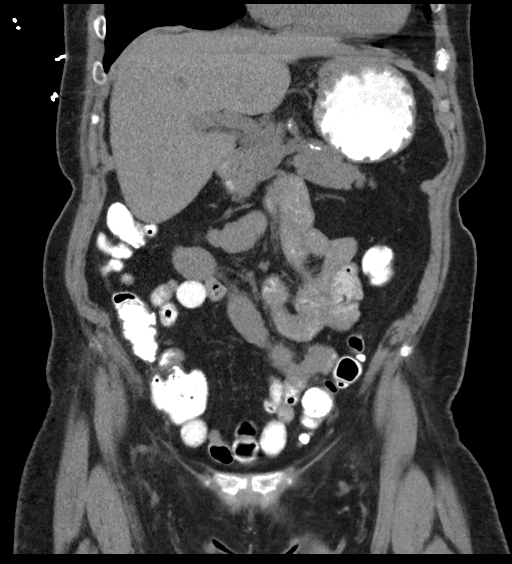
[im 38/86  soft-tissue]
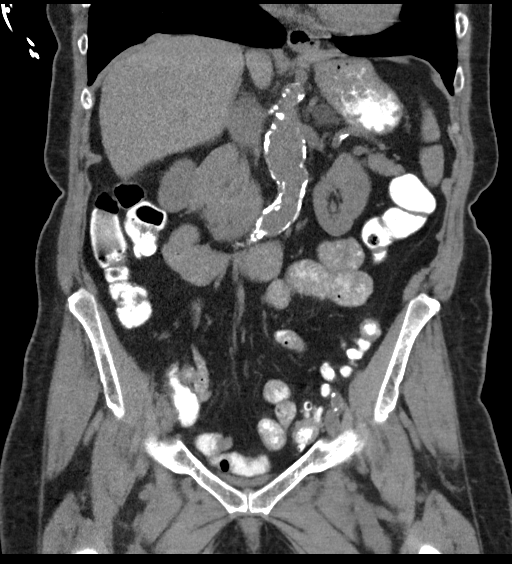
[im 48/86  soft-tissue]
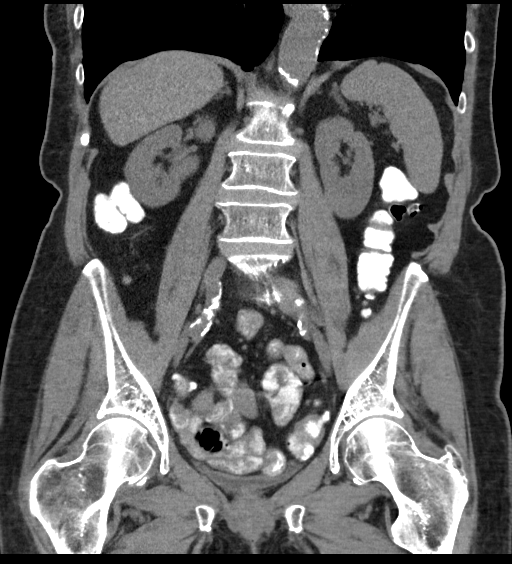

[16 of 46 positions shown; findings below may reference images not displayed]

FINDINGS: Lower chest: Calcified atherosclerosis and ectasia of the distal
thoracic aorta. No pericardial or pleural effusion. Negative lung
bases.

No upper abdominal free air.

Hepatobiliary: Diminutive or surgically absent gallbladder. Negative
noncontrast liver.

Pancreas: Negative.

Spleen: Negative.

Adrenals/Urinary Tract: Negative adrenal glands. Stable and largely
negative noncontrast kidneys; mild hilar calcification on the left
probably is vascular related. No hydroureter. Diminutive urinary
bladder.

Stomach/Bowel: Oral contrast has reached the rectum. Redundant
rectosigmoid colon with severe diverticulosis, but no areas of
active inflammation. Diverticulosis continues into the left colon
with no active inflammation. Mild to moderate diverticulosis of the
transverse colon and occasional diverticula in the right colon also
without active inflammation. Negative appendix. There is
diverticulosis of the terminal and distal ileum best demonstrated on
coronal images. No mesenteric stranding. No dilated small bowel.
Negative stomach and duodenum.

Vascular/Lymphatic: Extensive Aortoiliac calcified atherosclerosis
noted. Ectasia of the infrarenal abdominal aorta measuring up to 33
mm diameter has increased from 30-31 mm in 2081. Vascular patency
not evaluated in the absence of IV contrast.

No lymphadenopathy identified.

Reproductive: Diminutive or surgically absent uterus and adnexa.

Other: No pelvic free fluid.

Musculoskeletal: Osteopenia and degenerative changes. No acute
osseous abnormality identified.
IMPRESSION: 1. Extensive large bowel and distal small bowel diverticulosis with
no areas of diverticulitis or other active inflammation identified.
No bowel obstruction.
2. Extensive Aortoiliac calcified atherosclerosis with mild
progression of small infrarenal Abdominal Aortic Aneurysm since
2081. Recommend followup by ultrasound in 3 years. This
recommendation follows ACR consensus guidelines: White Paper of the
ACR Incidental Findings Committee II on Vascular Findings. [HOSPITAL] 7701; [DATE]

## 2018-08-08 NOTE — Progress Notes (Deleted)
Who patient ID: Allison Hampton, female   DOB: Sep 12, 1927, 84 y.o.   MRN: [id number] ANNUAL PREVENTATIVE CARE GYN  ENCOUNTER NOTE  Subjective:       Allison Hampton is a 84 y.o. G31P1001 female here for a routine annual gynecologic exam.  Current complaints: 1. Menopause, asymptomatic 2. Past history of pyometra, resolved  Bowel function and bladder function or reportedly normal.  She is not experiencing any vaginal bleeding or vaginal discharge.  Overall health has been stable this year except for life stressors due to family death and family relationships (daughter and grandson)  Gynecologic History No LMP recorded. Patient is postmenopausal. Contraception: post menopausal status Last Pap: unsure last one. Results were: normal Last mammogram:06/26/2016 birad 1. Results were: normal- dense  Obstetric History OB History  Gravida Para Term Preterm AB Living  1 1 1     1   SAB TAB Ectopic Multiple Live Births          1    # Outcome Date GA Lbr Len/2nd Weight Sex Delivery Anes PTL Lv  1 Term 1963   8 lb 9.6 oz (3.901 kg) F Vag-Spont   LIV    Past Medical History:  Diagnosis Date  . Abdominal mass   . Atrophic vaginitis   . Colitis, ischemic ([place])   . GERD (gastroesophageal reflux disease)   . Pelvic pain in female   . Pyometra   . Uterine mass     Past Surgical History:  Procedure Laterality Date  . adenoids    . cataract surgery    . CHOLECYSTECTOMY    . TONSILLECTOMY      Current Outpatient Medications on File Prior to Visit  Medication Sig Dispense Refill  . aspirin 81 MG chewable tablet Chew by mouth daily.    . diazepam (VALIUM) 5 MG tablet Take 0.5 mg by mouth daily.     Marland Kitchen docusate calcium (STOOL SOFTENER LAXATIVE DC) 240 MG capsule Take by mouth daily as needed.     . ferrous sulfate 325 (65 FE) MG tablet Take 325 mg by mouth daily with breakfast.    . loratadine (CLARITIN) 10 MG tablet Take 10 mg by mouth daily.    Marland Kitchen losartan (COZAAR) 50 MG tablet  Take 50 mg by mouth daily.    . Multiple Vitamins-Minerals (CENTRUM SILVER PO) Take 1 tablet by mouth daily.     . Multiple Vitamins-Minerals (PRESERVISION AREDS 2+MULTI VIT PO) Take by mouth.    . Probiotic Product (GNP PROBIOTIC COLON SUPPORT) CAPS Take by mouth.     No current facility-administered medications on file prior to visit.     Allergies  Allergen Reactions  . Cetirizine Other (See Comments)    tachycardia  . Ciprofloxacin   . Codeine   . Flagyl [Metronidazole]   . Iodinated Diagnostic Agents   . Moxifloxacin Hcl In Nacl Other (See Comments)    hallucination  . Penicillins     Has patient had a PCN reaction causing immediate rash, facial/tongue/throat swelling, SOB or lightheadedness with hypotension: yes Has patient had a PCN reaction causing severe rash involving mucus membranes or skin necrosis: yes Has patient had a PCN reaction that required hospitalization no Has patient had a PCN reaction occurring within the last 10 years: no If all of the above answers are "NO", then may proceed with Cephalosporin use.   . Shellfish Allergy   . Statins Other (See Comments)    myalgias, unspecified  . Sulfa Antibiotics  Social History   Socioeconomic History  . Marital status: Widowed    Spouse name: Not on file  . Number of children: Not on file  . Years of education: Not on file  . Highest education level: Not on file  Occupational History  . Not on file  Social Needs  . Financial resource strain: Not on file  . Food insecurity:    Worry: Not on file    Inability: Not on file  . Transportation needs:    Medical: Not on file    Non-medical: Not on file  Tobacco Use  . Smoking status: Light Tobacco Smoker    Packs/day: 0.25    Types: Cigarettes  . Smokeless tobacco: Never Used  Substance and Sexual Activity  . Alcohol use: No  . Drug use: No  . Sexual activity: Not Currently    Birth control/protection: Post-menopausal  Lifestyle  . Physical  activity:    Days per week: Not on file    Minutes per session: Not on file  . Stress: Not on file  Relationships  . Social connections:    Talks on phone: Not on file    Gets together: Not on file    Attends religious service: Not on file    Active member of club or organization: Not on file    Attends meetings of clubs or organizations: Not on file    Relationship status: Not on file  . Intimate partner violence:    Fear of current or ex partner: Not on file    Emotionally abused: Not on file    Physically abused: Not on file    Forced sexual activity: Not on file  Other Topics Concern  . Not on file  Social History Narrative  . Not on file    Family History  Problem Relation Age of Onset  . Heart disease Mother   . Cancer Neg Hx   . Diabetes Neg Hx   . Ovarian cancer Neg Hx     The following portions of the patient's history were reviewed and updated as appropriate: allergies, current medications, past family history, past medical history, past social history, past surgical history and problem list.  Review of Systems    Objective:  There were no vitals taken for this visit.  CONSTITUTIONAL: Well-developed, well-nourished female in no acute distress.  PSYCHIATRIC: Normal mood and affect. Normal behavior. Normal judgment and thought content. [place]: Alert and oriented to person, place, and time. Normal muscle tone coordination. No cranial nerve deficit noted. HENT:  Normocephalic, atraumatic EYES: Conjunctivae and EOM are normal. No scleral icterus.  NECK: Normal range of motion, supple, no masses.  Normal thyroid.  SKIN: Skin is warm and dry. No rash noted. Not diaphoretic. No erythema. No pallor.  Multiple seborrheic keratoses-diffuse CARDIOVASCULAR: Normal heart rate noted, regular rhythm, no murmur. RESPIRATORY: Clear to auscultation bilaterally. Effort and breath sounds normal, no problems with respiration noted. BREASTS: Symmetric in size. No masses, skin  changes, nipple drainage, or lymphadenopathy. Multiple benign moles present ABDOMEN: Soft, normal bowel sounds, no distention noted.  No tenderness, rebound or guarding. Right lower quadrant transverse abdominal wall incision, healed BLADDER: Normal PELVIC:  External Genitalia: Atrophic changes  BUS: Urethral caruncle  Vagina: Atrophic changes; good vault support   Cervix: Normal; no cervical motion tenderness  Uterus: Normal Size and shape, Nontender  Adnexa: Normal; nonpalpable and nontender  RV: External Exam NormaI, No Rectal Masses and Normal Sphincter tone  MUSCULOSKELETAL: Normal range of motion. No  tenderness.  No cyanosis, clubbing, or edema.  2+ distal pulses. LYMPHATIC: No Axillary, Supraclavicular, or Inguinal Adenopathy.    Assessment:   Annual gynecologic examination 84 y.o. Contraception: post menopausal status Normal BMI History of vaginal discharge and actinomyces infection, resolved; asymptomatic. Menopausal state, asymptomatic. History of abdominal pelvic abscess, status post colonoscopy, complication, resolved History of ischemic colitis, resolved without recurrence Tobacco user  Plan:  Pap: Not needed Mammogram: Not Indicated Stool Guaiac Testing:  Not Indicated Labs: thru pcp Routine preventative health maintenance measures emphasized: Exercise/Diet/Weight control, Tobacco Warnings, Alcohol/Substance use risks and Stress Management Return to [place], [place] acting as scribe for Dr. Enzo Bi.  Note: This dictation was prepared with Dragon dictation along with smaller phrase technology. Any transcriptional errors that result from this process are unintentional.

## 2018-08-14 ENCOUNTER — Encounter: Payer: Self-pay | Admitting: Obstetrics and Gynecology

## 2018-08-14 ENCOUNTER — Encounter: Payer: Medicare Other | Admitting: Obstetrics and Gynecology

## 2018-08-18 NOTE — Progress Notes (Deleted)
Who patient ID: Allison Hampton, female   DOB: 02-May-1928, 84 y.o.   MRN: [id number] ANNUAL PREVENTATIVE CARE GYN  ENCOUNTER NOTE  Subjective:       Allison Hampton is a 84 y.o. G23P1001 female here for a routine annual gynecologic exam.  Current complaints: 1. Menopause, asymptomatic 2. Past history of pyometra, resolved  Bowel function and bladder function or reportedly normal.  She is not experiencing any vaginal bleeding or vaginal discharge.  Overall health has been stable this year except for life stressors due to family death and family relationships (daughter and grandson)  Gynecologic History No LMP recorded. Patient is postmenopausal. Contraception: post menopausal status Last Pap: unsure last one. Results were: normal Last mammogram:06/26/2016 birad 1. Results were: normal- dense  Obstetric History OB History  Gravida Para Term Preterm AB Living  1 1 1     1   SAB TAB Ectopic Multiple Live Births          1    # Outcome Date GA Lbr Len/2nd Weight Sex Delivery Anes PTL Lv  1 Term 1963   8 lb 9.6 oz (3.901 kg) F Vag-Spont   LIV    Past Medical History:  Diagnosis Date  . Abdominal mass   . Atrophic vaginitis   . Colitis, ischemic ([place])   . GERD (gastroesophageal reflux disease)   . Pelvic pain in female   . Pyometra   . Uterine mass     Past Surgical History:  Procedure Laterality Date  . adenoids    . cataract surgery    . CHOLECYSTECTOMY    . TONSILLECTOMY      Current Outpatient Medications on File Prior to Visit  Medication Sig Dispense Refill  . aspirin 81 MG chewable tablet Chew by mouth daily.    . diazepam (VALIUM) 5 MG tablet Take 0.5 mg by mouth daily.     Marland Kitchen docusate calcium (STOOL SOFTENER LAXATIVE DC) 240 MG capsule Take by mouth daily as needed.     . ferrous sulfate 325 (65 FE) MG tablet Take 325 mg by mouth daily with breakfast.    . loratadine (CLARITIN) 10 MG tablet Take 10 mg by mouth daily.    Marland Kitchen losartan (COZAAR) 50 MG tablet  Take 50 mg by mouth daily.    . Multiple Vitamins-Minerals (CENTRUM SILVER PO) Take 1 tablet by mouth daily.     . Multiple Vitamins-Minerals (PRESERVISION AREDS 2+MULTI VIT PO) Take by mouth.    . Probiotic Product (GNP PROBIOTIC COLON SUPPORT) CAPS Take by mouth.     No current facility-administered medications on file prior to visit.     Allergies  Allergen Reactions  . Cetirizine Other (See Comments)    tachycardia  . Ciprofloxacin   . Codeine   . Flagyl [Metronidazole]   . Iodinated Diagnostic Agents   . Moxifloxacin Hcl In Nacl Other (See Comments)    hallucination  . Penicillins     Has patient had a PCN reaction causing immediate rash, facial/tongue/throat swelling, SOB or lightheadedness with hypotension: yes Has patient had a PCN reaction causing severe rash involving mucus membranes or skin necrosis: yes Has patient had a PCN reaction that required hospitalization no Has patient had a PCN reaction occurring within the last 10 years: no If all of the above answers are "NO", then may proceed with Cephalosporin use.   . Shellfish Allergy   . Statins Other (See Comments)    myalgias, unspecified  . Sulfa Antibiotics  Social History   Socioeconomic History  . Marital status: Widowed    Spouse name: Not on file  . Number of children: Not on file  . Years of education: Not on file  . Highest education level: Not on file  Occupational History  . Not on file  Social Needs  . Financial resource strain: Not on file  . Food insecurity:    Worry: Not on file    Inability: Not on file  . Transportation needs:    Medical: Not on file    Non-medical: Not on file  Tobacco Use  . Smoking status: Light Tobacco Smoker    Packs/day: 0.25    Types: Cigarettes  . Smokeless tobacco: Never Used  Substance and Sexual Activity  . Alcohol use: No  . Drug use: No  . Sexual activity: Not Currently    Birth control/protection: Post-menopausal  Lifestyle  . Physical  activity:    Days per week: Not on file    Minutes per session: Not on file  . Stress: Not on file  Relationships  . Social connections:    Talks on phone: Not on file    Gets together: Not on file    Attends religious service: Not on file    Active member of club or organization: Not on file    Attends meetings of clubs or organizations: Not on file    Relationship status: Not on file  . Intimate partner violence:    Fear of current or ex partner: Not on file    Emotionally abused: Not on file    Physically abused: Not on file    Forced sexual activity: Not on file  Other Topics Concern  . Not on file  Social History Narrative  . Not on file    Family History  Problem Relation Age of Onset  . Heart disease Mother   . Cancer Neg Hx   . Diabetes Neg Hx   . Ovarian cancer Neg Hx     The following portions of the patient's history were reviewed and updated as appropriate: allergies, current medications, past family history, past medical history, past social history, past surgical history and problem list.  Review of Systems   Objective:  There were no vitals taken for this visit.  CONSTITUTIONAL: Well-developed, well-nourished female in no acute distress.  PSYCHIATRIC: Normal mood and affect. Normal behavior. Normal judgment and thought content. [place]: Alert and oriented to person, place, and time. Normal muscle tone coordination. No cranial nerve deficit noted. HENT:  Normocephalic, atraumatic EYES: Conjunctivae and EOM are normal. No scleral icterus.  NECK: Normal range of motion, supple, no masses.  Normal thyroid.  SKIN: Skin is warm and dry. No rash noted. Not diaphoretic. No erythema. No pallor.  Multiple seborrheic keratoses-diffuse CARDIOVASCULAR: Normal heart rate noted, regular rhythm, no murmur. RESPIRATORY: Clear to auscultation bilaterally. Effort and breath sounds normal, no problems with respiration noted. BREASTS: Symmetric in size. No masses, skin  changes, nipple drainage, or lymphadenopathy. Multiple benign moles present ABDOMEN: Soft, normal bowel sounds, no distention noted.  No tenderness, rebound or guarding. Right lower quadrant transverse abdominal wall incision, healed BLADDER: Normal PELVIC:  External Genitalia: Atrophic changes  BUS: Urethral caruncle  Vagina: Atrophic changes; good vault support   Cervix: Normal; no cervical motion tenderness  Uterus: Normal Size and shape, Nontender  Adnexa: Normal; nonpalpable and nontender  RV: External Exam NormaI, No Rectal Masses and Normal Sphincter tone  MUSCULOSKELETAL: Normal range of motion. No tenderness.  No cyanosis, clubbing, or edema.  2+ distal pulses. LYMPHATIC: No Axillary, Supraclavicular, or Inguinal Adenopathy.    Assessment:   Annual gynecologic examination 84 y.o. Contraception: post menopausal status Normal BMI History of vaginal discharge and actinomyces infection, resolved; asymptomatic. Menopausal state, asymptomatic. History of abdominal pelvic abscess, status post colonoscopy, complication, resolved History of ischemic colitis, resolved without recurrence Tobacco user  Plan:  Pap: Not needed Mammogram: Not Indicated Stool Guaiac Testing:  Not Indicated Labs: thru pcp Routine preventative health maintenance measures emphasized: Exercise/Diet/Weight control, Tobacco Warnings, Alcohol/Substance use risks and Stress Management Smoking cessation and clinical impact on ischemic vascular disease discussed Return to [place], [place] acting as scribe for Dr. Enzo Bi.  Note: This dictation was prepared with Dragon dictation along with smaller phrase technology. Any transcriptional errors that result from this process are unintentional.

## 2018-08-21 ENCOUNTER — Encounter: Payer: Self-pay | Admitting: Obstetrics and Gynecology

## 2019-01-30 ENCOUNTER — Other Ambulatory Visit: Payer: Self-pay

## 2019-02-02 ENCOUNTER — Encounter: Payer: Self-pay | Admitting: Oncology

## 2019-02-02 ENCOUNTER — Other Ambulatory Visit: Payer: Self-pay

## 2019-02-02 ENCOUNTER — Inpatient Hospital Stay: Payer: Medicare Other | Attending: Oncology | Admitting: Oncology

## 2019-02-02 DIAGNOSIS — R701 Abnormal plasma viscosity: Secondary | ICD-10-CM

## 2019-02-02 DIAGNOSIS — D649 Anemia, unspecified: Secondary | ICD-10-CM

## 2019-02-02 DIAGNOSIS — D729 Disorder of white blood cells, unspecified: Secondary | ICD-10-CM

## 2019-02-02 DIAGNOSIS — Z79899 Other long term (current) drug therapy: Secondary | ICD-10-CM | POA: Insufficient documentation

## 2019-02-02 NOTE — Progress Notes (Signed)
Patient contacted via phone for telehelath visit.

## 2019-02-02 NOTE — Progress Notes (Signed)
HEMATOLOGY-ONCOLOGY TeleHEALTH VISIT INITIAL CONSULTATION  I connected with Allison Hampton on 02/02/19 at  9:30 AM EDT by video enabled telemedicine visit and verified that I am speaking with the correct person using two identifiers. I discussed the limitations, risks, security and privacy concerns of performing an evaluation and management service by telemedicine and the availability of in-person appointments. I also discussed with the patient that there may be a patient responsible charge related to this service. The patient expressed understanding and agreed to proceed.   Other persons participating in the visit and their role in the encounter:  Allison Merl, RN, check in patient.  Patient's daughter to help patient to set up virtual visit.  Patient's location: Home  Provider's location: Home office  Referring provider: Marval Regal, NP  Chief Complaint: Initial consultation for anemia, increased reticulocyte, and neutrophilia  HISTORY OF PRESENT ILLNESS Allison Hampton is a 85 y.o. female who was seen in consultation at the request of Marval Regal, NP for evaluation of anemia, increased reticulocyte and a neutrophilia. Patient reports doing well today.  Energy level is at baseline.  No change. Good appetite. She remains quite active and to COVID-19 home shattering policy. Denies any pain today.  She has a history of AVM, with chronic iron deficiency anemia, believed to be secondary to chronic blood loss secondary to AVM. Her last colonoscopy was 2015.  She had perforation secondary to colonoscopy and elected not to have any additional colonoscopy. Denies weight loss, fever, chills, fatigue, night sweats.  Denies hematochezia, hematuria, hematemesis, epistaxis, black tarry stool or easy bruising.   I reviewed patient's most recent labs done on 01/16/2019 at [facility] clinic. CBC showed hemoglobin 11.9, white blood cell 5, platelet counts 269, reticulocyte percentage  4.4%, absolute reticulocyte count  0.153. Differential shows decreased absolute lymphocyte count at point 8 9, lymphocyte percentage normal at 11.9%, increased neutrophil 71.2%, mildly decreased eosinophil 0.4%, Iron panel shows iron 146, TIBC 364, iron saturation 40%, ferritin 87. CRP less than 1, vitamin B12 984.  ESR 28, Stool Hemoccult positive   Review of Systems  Constitutional: Negative for appetite change, chills, fatigue and fever.  HENT:   Negative for hearing loss and voice change.   Eyes: Negative for eye problems.  Respiratory: Negative for chest tightness and cough.   Cardiovascular: Negative for chest pain.  Gastrointestinal: Negative for abdominal distention, abdominal pain and blood in stool.  Endocrine: Negative for hot flashes.  Genitourinary: Negative for difficulty urinating and frequency.   Musculoskeletal: Negative for arthralgias.  Skin: Negative for itching and rash.  Neurological: Negative for extremity weakness.  Hematological: Negative for adenopathy.  Psychiatric/Behavioral: Negative for confusion.    Past Medical History:  Diagnosis Date  . Abdominal mass   . Atrophic vaginitis   . Colitis, ischemic ([place])   . GERD (gastroesophageal reflux disease)   . Pelvic pain in female   . Pyometra   . Uterine mass    Past Surgical History:  Procedure Laterality Date  . adenoids    . cataract surgery    . CHOLECYSTECTOMY    . TONSILLECTOMY      Family History  Problem Relation Age of Onset  . Heart disease Mother   . Glaucoma Mother   . Aneurysm Mother   . Cancer Neg Hx   . Diabetes Neg Hx   . Ovarian cancer Neg Hx     Social History   Socioeconomic History  . Marital status: Widowed    Spouse  name: Not on file  . Number of children: Not on file  . Years of education: Not on file  . Highest education level: Not on file  Occupational History  . Not on file  Social Needs  . Financial resource strain: Not on file  . Food insecurity:    Worry:  Not on file    Inability: Not on file  . Transportation needs:    Medical: Not on file    Non-medical: Not on file  Tobacco Use  . Smoking status: Light Tobacco Smoker    Packs/day: 0.25    Types: Cigarettes  . Smokeless tobacco: Never Used  . Tobacco comment: 8 cigarettes a day  Substance and Sexual Activity  . Alcohol use: No  . Drug use: No  . Sexual activity: Not Currently    Birth control/protection: Post-menopausal  Lifestyle  . Physical activity:    Days per week: Not on file    Minutes per session: Not on file  . Stress: Not on file  Relationships  . Social connections:    Talks on phone: Not on file    Gets together: Not on file    Attends religious service: Not on file    Active member of club or organization: Not on file    Attends meetings of clubs or organizations: Not on file    Relationship status: Not on file  . Intimate partner violence:    Fear of current or ex partner: Not on file    Emotionally abused: Not on file    Physically abused: Not on file    Forced sexual activity: Not on file  Other Topics Concern  . Not on file  Social History Narrative  . Not on file    Current Outpatient Medications on File Prior to Visit  Medication Sig Dispense Refill  . aspirin 81 MG chewable tablet Chew by mouth daily.    . diazepam (VALIUM) 5 MG tablet Take 0.5 mg by mouth daily.     Marland Kitchen docusate calcium (STOOL SOFTENER LAXATIVE DC) 240 MG capsule Take by mouth daily as needed.     . ferrous sulfate 325 (65 FE) MG tablet Take 325 mg by mouth daily with breakfast.    . fluticasone (FLONASE) 50 MCG/ACT nasal spray fluticasone propionate 50 mcg/actuation nasal spray,suspension    . hydrochlorothiazide (HYDRODIURIL) 12.5 MG tablet TAKE ONE TABLET EVERY DAY    . loratadine (CLARITIN) 10 MG tablet Take 10 mg by mouth daily.    Marland Kitchen losartan (COZAAR) 50 MG tablet Take 50 mg by mouth daily.    . Multiple Vitamins-Minerals (CENTRUM SILVER PO) Take 1 tablet by mouth daily.      . Multiple Vitamins-Minerals (PRESERVISION AREDS 2+MULTI VIT PO) Take by mouth.    Marland Kitchen omeprazole (PRILOSEC) 20 MG capsule Take 20 mg by mouth daily.    . Probiotic Product (GNP PROBIOTIC COLON SUPPORT) CAPS Take by mouth.    . vitamin B-12 (CYANOCOBALAMIN) 1000 MCG tablet Take 1,000 mcg by mouth daily.     No current facility-administered medications on file prior to visit.     Allergies  Allergen Reactions  . Cetirizine Other (See Comments)    tachycardia  . Ciprofloxacin   . Codeine   . Flagyl [Metronidazole]   . Iodinated Diagnostic Agents   . Moxifloxacin Hcl In Nacl Other (See Comments)    hallucination  . Penicillins     Has patient had a PCN reaction causing immediate rash, facial/tongue/throat swelling, SOB or  lightheadedness with hypotension: yes Has patient had a PCN reaction causing severe rash involving mucus membranes or skin necrosis: yes Has patient had a PCN reaction that required hospitalization no Has patient had a PCN reaction occurring within the last 10 years: no If all of the above answers are "NO", then may proceed with Cephalosporin use.   . Shellfish Allergy   . Statins Other (See Comments)    myalgias, unspecified  . Sulfa Antibiotics        Observations/Objective: Today's Vitals   02/02/19 0920  PainSc: 0-No pain   There is no height or weight on file to calculate BMI.  Physical Exam  Constitutional: She is oriented to person, place, and time. No distress.  HENT:  Head: Normocephalic and atraumatic.  Pulmonary/Chest: Effort normal.  Neurological: She is alert and oriented to person, place, and time.  Psychiatric: Affect normal.    I have personally reviewed below laboratory results.  CBC    Component Value Date/Time   WBC 5.2 07/22/2016 1851   RBC 2.39 (L) 07/22/2016 1851   HGB 8.8 (L) 07/22/2016 1851   HGB 9.6 (L) 12/06/2013 0426   HCT 23.7 (L) 07/22/2016 1851   HCT 29.7 (L) 12/06/2013 0426   PLT 263 07/22/2016 1851   PLT 360  12/06/2013 0426   MCV 99.0 07/22/2016 1851   MCV 80 12/06/2013 0426   MCH 36.6 (H) 07/22/2016 1851   MCHC 37.0 (H) 07/22/2016 1851   RDW 12.9 07/22/2016 1851   RDW 21.7 (H) 12/06/2013 0426   LYMPHSABS 1.1 07/17/2016 0722   LYMPHSABS 2.5 12/06/2013 0426   MONOABS 0.4 07/17/2016 0722   MONOABS 0.4 12/06/2013 0426   EOSABS 0.1 07/17/2016 0722   EOSABS 0.1 12/06/2013 0426   BASOSABS 0.0 07/17/2016 0722   BASOSABS 0.0 12/06/2013 0426    CMP     Component Value Date/Time   NA 132 (L) 07/22/2016 1851   NA 135 (L) 12/06/2013 0426   K 3.2 (L) 07/22/2016 1851   K 3.5 12/06/2013 0426   CL 99 (L) 07/22/2016 1851   CL 101 12/06/2013 0426   CO2 24 07/22/2016 1851   CO2 27 12/06/2013 0426   GLUCOSE 112 (H) 07/22/2016 1851   GLUCOSE 88 12/06/2013 0426   BUN 8 07/22/2016 1851   BUN 7 12/06/2013 0426   CREATININE 0.67 07/22/2016 1851   CREATININE 0.62 12/06/2013 0426   CALCIUM 8.4 (L) 07/22/2016 1851   CALCIUM 8.0 (L) 12/06/2013 0426   PROT 6.7 07/22/2016 1851   PROT 7.6 12/04/2013 2341   ALBUMIN 4.0 07/22/2016 1851   ALBUMIN 2.5 (L) 12/04/2013 2341   AST 24 07/22/2016 1851   AST 19 12/04/2013 2341   ALT 14 07/22/2016 1851   ALT 16 12/04/2013 2341   ALKPHOS 59 07/22/2016 1851   ALKPHOS 116 12/04/2013 2341   BILITOT 0.8 07/22/2016 1851   BILITOT 0.2 12/04/2013 2341   GFRNONAA >60 07/22/2016 1851   GFRNONAA >60 12/06/2013 0426   GFRAA >60 07/22/2016 1851   GFRAA >60 12/06/2013 0426     RADIOGRAPHIC STUDIES: I have personally reviewed the radiological images as listed and agreed with the findings in the report.  No results found.  07/22/2016 CT abdomen pelvis without contrast Extensive large bowel and distal small bowel diverticulosis with no area of diverticulitis or other active inflammation identified.  No bowel obstruction.  Extensive aortoiliac calcific atherosclerosis with mild progression of small infrarenal abdominal aortic aneurysm since 2015.  Recommend follow-up by  ultrasound in  3 years.   Assessment and Plan: 1. Normocytic anemia   2. Reticulocytosis   3. Neutrophilia     I reviewed patient's labs that was done at [facility] clinic. Mild normocytic anemia, normal iron panel. Increased reticulocytosis can be due to reactive bone marrow in response to chronic blood loss.  Hemolysis is another possibility. Anemia: repeat CBC w differential, check CMP, retic panel, smear, TSH,  LDH, haptoglobin, monoclonal gammopathy evaluation.    Mild neutrophilia without increase of total WBC.  Mild lymphocytopenia. I will send a peripheral flow cytometry. Most likely reactive secondary to inflammation/smoking.  Follow Up Instructions: 2 months MD visit   I discussed the assessment and treatment plan with the patient. The patient was provided an opportunity to ask questions and all were answered. The patient agreed with the plan and demonstrated an understanding of the instructions.  The patient was advised to call back or seek an in-person evaluation if the symptoms worsen or if the condition fails to improve as anticipated.   I provided 44mnutes of face-to-face video visit time during this encounter, and > 50% was spent counseling as documented under my assessment & plan.   ZEarlie Server MD 02/02/2019 2:29 PM

## 2019-02-06 ENCOUNTER — Other Ambulatory Visit: Payer: Self-pay

## 2019-02-06 DIAGNOSIS — I10 Essential (primary) hypertension: Secondary | ICD-10-CM

## 2019-02-06 NOTE — Progress Notes (Signed)
Received call from [place] at Dr. Abbe [place] office requesting for lipid panel to be added to labs on 6/26 so patient won't have to be stuck twice. Dr. Tasia Catchings ok to add lab. Will fax to his office once results are in.

## 2019-02-26 ENCOUNTER — Other Ambulatory Visit: Payer: Self-pay

## 2019-02-27 ENCOUNTER — Inpatient Hospital Stay: Payer: Medicare Other

## 2019-02-27 ENCOUNTER — Other Ambulatory Visit: Payer: Self-pay

## 2019-02-27 DIAGNOSIS — I10 Essential (primary) hypertension: Secondary | ICD-10-CM

## 2019-02-27 DIAGNOSIS — Z79899 Other long term (current) drug therapy: Secondary | ICD-10-CM | POA: Diagnosis not present

## 2019-02-27 DIAGNOSIS — D729 Disorder of white blood cells, unspecified: Secondary | ICD-10-CM

## 2019-02-27 DIAGNOSIS — R701 Abnormal plasma viscosity: Secondary | ICD-10-CM | POA: Diagnosis not present

## 2019-02-27 DIAGNOSIS — D72828 Other elevated white blood cell count: Secondary | ICD-10-CM

## 2019-02-27 DIAGNOSIS — D649 Anemia, unspecified: Secondary | ICD-10-CM

## 2019-02-27 LAB — CBC WITH DIFFERENTIAL/PLATELET
Abs Immature Granulocytes: 0.07 10*3/uL (ref 0.00–0.07)
Basophils Absolute: 0 10*3/uL (ref 0.0–0.1)
Basophils Relative: 1 %
Eosinophils Absolute: 0.1 10*3/uL (ref 0.0–0.5)
Eosinophils Relative: 1 %
HCT: 35.5 % — ABNORMAL LOW (ref 36.0–46.0)
Hemoglobin: 12.5 g/dL (ref 12.0–15.0)
Immature Granulocytes: 1 %
Lymphocytes Relative: 23 %
Lymphs Abs: 1.3 10*3/uL (ref 0.7–4.0)
MCH: 33.5 pg (ref 26.0–34.0)
MCHC: 35.2 g/dL (ref 30.0–36.0)
MCV: 95.2 fL (ref 80.0–100.0)
Monocytes Absolute: 0.3 10*3/uL (ref 0.1–1.0)
Monocytes Relative: 5 %
Neutro Abs: 4 10*3/uL (ref 1.7–7.7)
Neutrophils Relative %: 69 %
Platelets: 277 10*3/uL (ref 150–400)
RBC: 3.73 MIL/uL — ABNORMAL LOW (ref 3.87–5.11)
RDW: 11.9 % (ref 11.5–15.5)
WBC: 5.8 10*3/uL (ref 4.0–10.5)
nRBC: 0 % (ref 0.0–0.2)

## 2019-02-27 LAB — RETIC PANEL
Immature Retic Fract: 8.1 % (ref 2.3–15.9)
RBC.: 3.73 MIL/uL — ABNORMAL LOW (ref 3.87–5.11)
Retic Count, Absolute: 163 10*3/uL (ref 19.0–186.0)
Retic Ct Pct: 4.4 % — ABNORMAL HIGH (ref 0.4–3.1)
Reticulocyte Hemoglobin: 35.9 pg (ref >27.9)

## 2019-02-27 LAB — COMPREHENSIVE METABOLIC PANEL
ALT: 15 U/L (ref 0–44)
AST: 23 U/L (ref 15–41)
Albumin: 4.3 g/dL (ref 3.5–5.0)
Alkaline Phosphatase: 68 U/L (ref 38–126)
Anion gap: 12 (ref 5–15)
BUN: 11 mg/dL (ref 8–23)
CO2: 26 mmol/L (ref 22–32)
Calcium: 9.2 mg/dL (ref 8.9–10.3)
Chloride: 89 mmol/L — ABNORMAL LOW (ref 98–111)
Creatinine, Ser: 0.65 mg/dL (ref 0.44–1.00)
GFR calc Af Amer: >60 mL/min (ref >60)
GFR calc non Af Amer: >60 mL/min (ref >60)
Glucose, Bld: 181 mg/dL — ABNORMAL HIGH (ref 70–99)
Potassium: 3.6 mmol/L (ref 3.5–5.1)
Sodium: 127 mmol/L — ABNORMAL LOW (ref 135–145)
Total Bilirubin: 0.6 mg/dL (ref 0.3–1.2)
Total Protein: 7.3 g/dL (ref 6.5–8.1)

## 2019-02-27 LAB — LIPID PANEL
Cholesterol: 183 mg/dL (ref 0–200)
HDL: 45 mg/dL (ref >40)
LDL Cholesterol: 109 mg/dL — ABNORMAL HIGH (ref 0–99)
Total CHOL/HDL Ratio: 4.1 RATIO
Triglycerides: 143 mg/dL (ref <150)
VLDL: 29 mg/dL (ref 0–40)

## 2019-02-27 LAB — TECHNOLOGIST SMEAR REVIEW

## 2019-02-27 LAB — LACTATE DEHYDROGENASE: LDH: 99 U/L (ref 98–192)

## 2019-02-28 LAB — HAPTOGLOBIN: Haptoglobin: 83 mg/dL (ref 41–333)

## 2019-03-03 LAB — COMP PANEL: LEUKEMIA/LYMPHOMA

## 2019-03-16 ENCOUNTER — Encounter: Payer: Self-pay | Admitting: Oncology

## 2019-03-18 ENCOUNTER — Encounter: Payer: Self-pay | Admitting: Oncology

## 2019-03-18 ENCOUNTER — Inpatient Hospital Stay: Payer: Medicare Other | Attending: Oncology | Admitting: Oncology

## 2019-03-18 ENCOUNTER — Other Ambulatory Visit: Payer: Self-pay

## 2019-03-18 DIAGNOSIS — R701 Abnormal plasma viscosity: Secondary | ICD-10-CM | POA: Diagnosis not present

## 2019-03-18 DIAGNOSIS — D729 Disorder of white blood cells, unspecified: Secondary | ICD-10-CM | POA: Diagnosis not present

## 2019-03-18 DIAGNOSIS — E871 Hypo-osmolality and hyponatremia: Secondary | ICD-10-CM | POA: Insufficient documentation

## 2019-03-18 DIAGNOSIS — D649 Anemia, unspecified: Secondary | ICD-10-CM | POA: Diagnosis not present

## 2019-03-18 NOTE — Progress Notes (Signed)
HEMATOLOGY-ONCOLOGY TeleHEALTH VISIT  I connected with Allison Hampton on 03/18/19 at  8:30 AM EDT by video enabled telemedicine visit and verified that I am speaking with the correct person using two identifiers. I discussed the limitations, risks, security and privacy concerns of performing an evaluation and management service by telemedicine and the availability of in-person appointments. I also discussed with the patient that there may be a patient responsible charge related to this service. The patient expressed understanding and agreed to proceed.   Other persons participating in the visit and their role in the encounter:  Patient's daughter to help patient to set up virtual visit.  Patient's location: Home  Provider's location:  office  Referring provider: Derinda Late, MD  Chief Complaint: follow up for  anemia, increased reticulocyte, and neutrophilia  HISTORY OF PRESENT ILLNESS Allison Hampton is a 85 y.o. female who was seen in consultation at the request of Derinda Late, MD for evaluation of anemia, increased reticulocyte and a neutrophilia. Patient reports doing well today.  Energy level is at baseline.  No change. Good appetite. She remains quite active and to COVID-19 home shattering policy. Denies any pain today.  She has a history of AVM, with chronic iron deficiency anemia, believed to be secondary to chronic blood loss secondary to AVM. Her last colonoscopy was 2015.  She had perforation secondary to colonoscopy and elected not to have any additional colonoscopy. Denies weight loss, fever, chills, fatigue, night sweats.  Denies hematochezia, hematuria, hematemesis, epistaxis, black tarry stool or easy bruising.   I reviewed patient's most recent labs done on 01/16/2019 at [facility] clinic. CBC showed hemoglobin 11.9, white blood cell 5, platelet counts 269, reticulocyte percentage 4.4%, absolute reticulocyte count  0.153. Differential shows decreased absolute  lymphocyte count at point 8 9, lymphocyte percentage normal at 11.9%, increased neutrophil 71.2%, mildly decreased eosinophil 0.4%, Iron panel shows iron 146, TIBC 364, iron saturation 40%, ferritin 87. CRP less than 1, vitamin B12 984.  ESR 28, Stool Hemoccult positive  INTERVAL HISTORY Allison Hampton is a 85 y.o. female who has above history reviewed by me today presents for follow up visit discussion of blood work evaluation results for anemia, reticulocytosis, neutropenia. Problems and complaints are listed below: Patient originally was scheduled for an in person visit as the initial consultation was done virtually. Patient and daughter called and request visit to be changed to virtually as patient wants daughter to accompany her visit.  Due to ou current institution visitor policy, daughter is not allowed to be in clinical room with patient.  Therefore patient request visit to be changed to a virtual visit.  Today patient reports feeling well at baseline.  She reports drinking noncaffeine ice tea and water quite frequently throughout the day.  Denies any new complaints. Feels at baseline.  Review of Systems  Constitutional: Negative for appetite change, chills, fatigue and fever.  HENT:   Negative for hearing loss and voice change.   Eyes: Negative for eye problems.  Respiratory: Negative for chest tightness and cough.   Cardiovascular: Negative for chest pain.  Gastrointestinal: Negative for abdominal distention, abdominal pain and blood in stool.  Endocrine: Negative for hot flashes.  Genitourinary: Negative for difficulty urinating and frequency.   Musculoskeletal: Negative for arthralgias.  Skin: Negative for itching and rash.  Neurological: Negative for extremity weakness.  Hematological: Negative for adenopathy.  Psychiatric/Behavioral: Negative for confusion.    Past Medical History:  Diagnosis Date  . Abdominal mass   .  Atrophic vaginitis   . Colitis, ischemic ([place])    . GERD (gastroesophageal reflux disease)   . Pelvic pain in female   . Pyometra   . Uterine mass    Past Surgical History:  Procedure Laterality Date  . adenoids    . cataract surgery    . CHOLECYSTECTOMY    . TONSILLECTOMY      Family History  Problem Relation Age of Onset  . Heart disease Mother   . Glaucoma Mother   . Aneurysm Mother   . Cancer Neg Hx   . Diabetes Neg Hx   . Ovarian cancer Neg Hx     Social History   Socioeconomic History  . Marital status: Widowed    Spouse name: Not on file  . Number of children: Not on file  . Years of education: Not on file  . Highest education level: Not on file  Occupational History  . Not on file  Social Needs  . Financial resource strain: Not on file  . Food insecurity    Worry: Not on file    Inability: Not on file  . Transportation needs    Medical: Not on file    Non-medical: Not on file  Tobacco Use  . Smoking status: Light Tobacco Smoker    Packs/day: 0.25    Types: Cigarettes  . Smokeless tobacco: Never Used  . Tobacco comment: 8 cigarettes a day  Substance and Sexual Activity  . Alcohol use: No  . Drug use: No  . Sexual activity: Not Currently    Birth control/protection: Post-menopausal  Lifestyle  . Physical activity    Days per week: Not on file    Minutes per session: Not on file  . Stress: Not on file  Relationships  . Social Herbalist on phone: Not on file    Gets together: Not on file    Attends religious service: Not on file    Active member of club or organization: Not on file    Attends meetings of clubs or organizations: Not on file    Relationship status: Not on file  . Intimate partner violence    Fear of current or ex partner: Not on file    Emotionally abused: Not on file    Physically abused: Not on file    Forced sexual activity: Not on file  Other Topics Concern  . Not on file  Social History Narrative  . Not on file    Current Outpatient Medications on File  Prior to Visit  Medication Sig Dispense Refill  . aspirin 81 MG chewable tablet Chew by mouth daily.    . diazepam (VALIUM) 5 MG tablet Take 0.5 mg by mouth daily.     Marland Kitchen docusate calcium (STOOL SOFTENER LAXATIVE DC) 240 MG capsule Take by mouth daily as needed.     . ferrous sulfate 325 (65 FE) MG tablet Take 325 mg by mouth daily with breakfast.    . fluticasone (FLONASE) 50 MCG/ACT nasal spray fluticasone propionate 50 mcg/actuation nasal spray,suspension    . hydrochlorothiazide (HYDRODIURIL) 12.5 MG tablet TAKE ONE TABLET EVERY DAY    . loratadine (CLARITIN) 10 MG tablet Take 10 mg by mouth daily.    Marland Kitchen losartan (COZAAR) 50 MG tablet Take 50 mg by mouth daily.    . Multiple Vitamins-Minerals (CENTRUM SILVER PO) Take 1 tablet by mouth daily.     . Multiple Vitamins-Minerals (PRESERVISION AREDS 2+MULTI VIT PO) Take by mouth.    Marland Kitchen  omeprazole (PRILOSEC) 20 MG capsule Take 20 mg by mouth daily.    . Probiotic Product (GNP PROBIOTIC COLON SUPPORT) CAPS Take by mouth.    . vitamin B-12 (CYANOCOBALAMIN) 1000 MCG tablet Take 1,000 mcg by mouth daily.     No current facility-administered medications on file prior to visit.     Allergies  Allergen Reactions  . Cetirizine Other (See Comments)    tachycardia  . Ciprofloxacin   . Codeine   . Flagyl [Metronidazole]   . Iodinated Diagnostic Agents   . Moxifloxacin Hcl In Nacl Other (See Comments)    hallucination  . Penicillins     Has patient had a PCN reaction causing immediate rash, facial/tongue/throat swelling, SOB or lightheadedness with hypotension: yes Has patient had a PCN reaction causing severe rash involving mucus membranes or skin necrosis: yes Has patient had a PCN reaction that required hospitalization no Has patient had a PCN reaction occurring within the last 10 years: no If all of the above answers are "NO", then may proceed with Cephalosporin use.   . Shellfish Allergy   . Statins Other (See Comments)    myalgias,  unspecified  . Sulfa Antibiotics        Observations/Objective: Today's Vitals   03/18/19 0844  PainSc: 0-No pain   There is no height or weight on file to calculate BMI.  Physical Exam  Constitutional: No distress.  HENT:  Head: Normocephalic and atraumatic.  Pulmonary/Chest: Effort normal.  Neurological: She is alert.  Psychiatric: Affect normal.    I have personally reviewed below laboratory results.  CBC    Component Value Date/Time   WBC 5.8 02/27/2019 1328   RBC 3.73 (L) 02/27/2019 1328   RBC 3.73 (L) 02/27/2019 1328   HGB 12.5 02/27/2019 1328   HGB 9.6 (L) 12/06/2013 0426   HCT 35.5 (L) 02/27/2019 1328   HCT 29.7 (L) 12/06/2013 0426   PLT 277 02/27/2019 1328   PLT 360 12/06/2013 0426   MCV 95.2 02/27/2019 1328   MCV 80 12/06/2013 0426   MCH 33.5 02/27/2019 1328   MCHC 35.2 02/27/2019 1328   RDW 11.9 02/27/2019 1328   RDW 21.7 (H) 12/06/2013 0426   LYMPHSABS 1.3 02/27/2019 1328   LYMPHSABS 2.5 12/06/2013 0426   MONOABS 0.3 02/27/2019 1328   MONOABS 0.4 12/06/2013 0426   EOSABS 0.1 02/27/2019 1328   EOSABS 0.1 12/06/2013 0426   BASOSABS 0.0 02/27/2019 1328   BASOSABS 0.0 12/06/2013 0426    CMP     Component Value Date/Time   NA 127 (L) 02/27/2019 1328   NA 135 (L) 12/06/2013 0426   K 3.6 02/27/2019 1328   K 3.5 12/06/2013 0426   CL 89 (L) 02/27/2019 1328   CL 101 12/06/2013 0426   CO2 26 02/27/2019 1328   CO2 27 12/06/2013 0426   GLUCOSE 181 (H) 02/27/2019 1328   GLUCOSE 88 12/06/2013 0426   BUN 11 02/27/2019 1328   BUN 7 12/06/2013 0426   CREATININE 0.65 02/27/2019 1328   CREATININE 0.62 12/06/2013 0426   CALCIUM 9.2 02/27/2019 1328   CALCIUM 8.0 (L) 12/06/2013 0426   PROT 7.3 02/27/2019 1328   PROT 7.6 12/04/2013 2341   ALBUMIN 4.3 02/27/2019 1328   ALBUMIN 2.5 (L) 12/04/2013 2341   AST 23 02/27/2019 1328   AST 19 12/04/2013 2341   ALT 15 02/27/2019 1328   ALT 16 12/04/2013 2341   ALKPHOS 68 02/27/2019 1328   ALKPHOS 116 12/04/2013  2341   BILITOT  0.6 02/27/2019 1328   BILITOT 0.2 12/04/2013 2341   GFRNONAA >60 02/27/2019 1328   GFRNONAA >60 12/06/2013 0426   GFRAA >60 02/27/2019 1328   GFRAA >60 12/06/2013 0426     RADIOGRAPHIC STUDIES: I have personally reviewed the radiological images as listed and agreed with the findings in the report.  No results found.  07/22/2016 CT abdomen pelvis without contrast Extensive large bowel and distal small bowel diverticulosis with no area of diverticulitis or other active inflammation identified.  No bowel obstruction.  Extensive aortoiliac calcific atherosclerosis with mild progression of small infrarenal abdominal aortic aneurysm since 2015.  Recommend follow-up by ultrasound in 3 years.   Assessment and Plan: 1. Neutrophilia   2. Normocytic anemia   3. Reticulocytosis   4. Hyponatremia     Labs are reviewed and discussed with patient. Mild normocytic anemia has improved.  Hemoglobin 12.5. Normal reticulocyte hemoglobin level indicating good iron stores. Normal immature reticular fraction.  Normal absolute reticulocyte. This might be explained by a recovery of anemia. Smear shows unremarkable morphology. neutropenia has resolved as well. No overt evidence of hemolysis with normal haptoglobin and LDH. Flow cytometry showed no diagnostic immunophenotypic abnormality.  41% T cells show loss of CD7 expression which can be reactive versus secondary to neoplastic process.  Discussed with patient that I will check T-cell receptor gene rearrangement.  Chronic hyponatremia, sodium slightly worse than her baseline.  At 127. Asymptomatic. Recommend patient to drink electrolyte water. given that she has been a light smoker, I will start with chest x-ray.  We discussed the limitation of virtual visit.  Recommend future in person visits to complete physical examination. Due to [facility]IR-48 and visitor policy, patient prefers to do virtual visit in the future until visitor policy  allows and daughter to accompany patients visit.  Follow Up Instructions: 4 weeks to discuss additional blood work results.   I discussed the assessment and treatment plan with the patient. The patient was provided an opportunity to ask questions and all were answered. The patient agreed with the plan and demonstrated an understanding of the instructions.  The patient was advised to call back or seek an in-person evaluation if the symptoms worsen or if the condition fails to improve as anticipated.   Earlie Server, MD 03/18/2019 8:49 AM

## 2019-03-18 NOTE — Progress Notes (Signed)
Called patient today for Telehealth visit via [place].  Patient states no new concerns today

## 2019-03-20 ENCOUNTER — Inpatient Hospital Stay: Payer: Medicare Other

## 2019-03-20 ENCOUNTER — Other Ambulatory Visit: Payer: Self-pay

## 2019-03-20 ENCOUNTER — Ambulatory Visit
Admission: RE | Admit: 2019-03-20 | Discharge: 2019-03-20 | Disposition: A | Discharge status: Home / Self Care | Payer: Medicare Other | Source: Ambulatory Visit | Attending: Oncology | Admitting: Oncology

## 2019-03-20 ENCOUNTER — Ambulatory Visit
Admission: RE | Admit: 2019-03-20 | Discharge: 2019-03-20 | Disposition: A | Discharge status: Home / Self Care | Payer: Medicare Other | Attending: Oncology | Admitting: Oncology

## 2019-03-20 DIAGNOSIS — D649 Anemia, unspecified: Secondary | ICD-10-CM | POA: Insufficient documentation

## 2019-03-20 DIAGNOSIS — D729 Disorder of white blood cells, unspecified: Secondary | ICD-10-CM

## 2019-03-20 DIAGNOSIS — R701 Abnormal plasma viscosity: Secondary | ICD-10-CM

## 2019-03-20 DIAGNOSIS — E871 Hypo-osmolality and hyponatremia: Secondary | ICD-10-CM | POA: Insufficient documentation

## 2019-03-20 LAB — BASIC METABOLIC PANEL
Anion gap: 13 (ref 5–15)
BUN: 12 mg/dL (ref 8–23)
CO2: 27 mmol/L (ref 22–32)
Calcium: 9.3 mg/dL (ref 8.9–10.3)
Chloride: 89 mmol/L — ABNORMAL LOW (ref 98–111)
Creatinine, Ser: 0.62 mg/dL (ref 0.44–1.00)
GFR calc Af Amer: >60 mL/min (ref >60)
GFR calc non Af Amer: >60 mL/min (ref >60)
Glucose, Bld: 107 mg/dL — ABNORMAL HIGH (ref 70–99)
Potassium: 4.3 mmol/L (ref 3.5–5.1)
Sodium: 129 mmol/L — ABNORMAL LOW (ref 135–145)

## 2019-03-26 LAB — MISC LABCORP TEST (SEND OUT): Labcorp test code: 481080

## 2019-03-27 ENCOUNTER — Encounter: Payer: Self-pay | Admitting: Oncology

## 2019-03-27 ENCOUNTER — Telehealth: Payer: Self-pay

## 2019-03-27 NOTE — Telephone Encounter (Signed)
Contacted daughter regarding feeling anxious over results. Informed her we could move appt. Up sooner, per Dr. Tasia Catchings, and that she would go over all results with the patient. Daughter is requesting to listen in on appt. Daughter verbalizes understanding and denies any further questions or concerns.

## 2019-03-30 ENCOUNTER — Encounter: Payer: Self-pay | Admitting: Oncology

## 2019-03-30 ENCOUNTER — Other Ambulatory Visit: Payer: Self-pay

## 2019-03-30 ENCOUNTER — Inpatient Hospital Stay (HOSPITAL_BASED_OUTPATIENT_CLINIC_OR_DEPARTMENT_OTHER): Payer: Medicare Other | Admitting: Oncology

## 2019-03-30 VITALS — BP 152/80 | HR 91 | Temp 98.9°F | Resp 18 | Wt 149.4 lb

## 2019-03-30 DIAGNOSIS — D649 Anemia, unspecified: Secondary | ICD-10-CM

## 2019-03-30 DIAGNOSIS — D729 Disorder of white blood cells, unspecified: Secondary | ICD-10-CM | POA: Diagnosis not present

## 2019-03-30 NOTE — Progress Notes (Signed)
Pt here for follow up. Pt states is feeling well. Offers no complaints.

## 2019-03-31 NOTE — Progress Notes (Signed)
Hematology/Oncology Follow Up Note [facility]  Telephone:([phone] [phone]  Patient Care Team: Derinda Late, MD as PCP - General (Family Medicine)   Name of the patient: Allison Hampton  [id number]  03-26-1928   REASON FOR VISIT  discussion of lab work-up.  PERTINENT HEMATOLOGY HISTORY Jeni Duling Sturkey is a 85 y.o.Marland Kitchen female who was seen in consultation at the request of Derinda Late, MD for evaluation of anemia, increased reticulocyte and a neutrophilia. Patient reports doing well today.  Energy level is at baseline.  No change. She has a history of AVM, with chronic iron deficiency anemia, believed to be secondary to chronic blood loss secondary to AVM. Her last colonoscopy was 2015.  She had perforation secondary to colonoscopy and elected not to have any additional colonoscopy. Denies weight loss, fever, chills, fatigue, night sweats.  Denies hematochezia, hematuria, hematemesis, epistaxis, black tarry stool or easy bruising.   I reviewed patient's most recent labs done on 01/16/2019 at [facility] clinic. CBC showed hemoglobin 11.9, white blood cell 5, platelet counts 269, reticulocyte percentage 4.4%, absolute reticulocyte count  0.153. Differential shows decreased absolute lymphocyte count at point 8 9, lymphocyte percentage normal at 11.9%, increased neutrophil 71.2%, mildly decreased eosinophil 0.4%, Iron panel shows iron 146, TIBC 364, iron saturation 40%, ferritin 87. CRP less than 1, vitamin B12 984.  ESR 28, Stool Hemoccult positive She has a history of chronic hyponatremia.  Asymptomatic.  INTERVAL HISTORY 85 y.o. female present to discuss lab work-up results for neutrophilia, normocytic anemia and reticulocytosis. Today patient reports no new complaints.  Feeling well at baseline. Denies any fever, chills, unintentional weight loss, night sweats. She remains quite active.   Review of Systems  Constitutional: Negative for  appetite change, chills, fatigue and fever.  HENT:   Negative for hearing loss and voice change.   Eyes: Negative for eye problems.  Respiratory: Negative for chest tightness and cough.   Cardiovascular: Negative for chest pain.  Gastrointestinal: Negative for abdominal distention, abdominal pain and blood in stool.  Endocrine: Negative for hot flashes.  Genitourinary: Negative for difficulty urinating and frequency.   Musculoskeletal: Negative for arthralgias.  Skin: Negative for itching and rash.  Neurological: Negative for extremity weakness.  Hematological: Negative for adenopathy.  Psychiatric/Behavioral: Negative for confusion.      Allergies  Allergen Reactions  . Cetirizine Other (See Comments)    tachycardia  . Ciprofloxacin   . Codeine   . Flagyl [Metronidazole]   . Iodinated Diagnostic Agents   . Moxifloxacin Hcl In Nacl Other (See Comments)    hallucination  . Penicillins     Has patient had a PCN reaction causing immediate rash, facial/tongue/throat swelling, SOB or lightheadedness with hypotension: yes Has patient had a PCN reaction causing severe rash involving mucus membranes or skin necrosis: yes Has patient had a PCN reaction that required hospitalization no Has patient had a PCN reaction occurring within the last 10 years: no If all of the above answers are "NO", then may proceed with Cephalosporin use.   . Shellfish Allergy   . Statins Other (See Comments)    myalgias, unspecified  . Sulfa Antibiotics      Past Medical History:  Diagnosis Date  . Abdominal mass   . Atrophic vaginitis   . Colitis, ischemic ([place])   . GERD (gastroesophageal reflux disease)   . Pelvic pain in female   . Pyometra   . Uterine mass      Past Surgical History:  Procedure Laterality Date  . adenoids    . cataract surgery    . CHOLECYSTECTOMY    . TONSILLECTOMY      Social History   Socioeconomic History  . Marital status: Widowed    Spouse name: Not on file   . Number of children: Not on file  . Years of education: Not on file  . Highest education level: Not on file  Occupational History  . Not on file  Social Needs  . Financial resource strain: Not on file  . Food insecurity    Worry: Not on file    Inability: Not on file  . Transportation needs    Medical: Not on file    Non-medical: Not on file  Tobacco Use  . Smoking status: Light Tobacco Smoker    Packs/day: 0.25    Types: Cigarettes  . Smokeless tobacco: Never Used  . Tobacco comment: 8 cigarettes a day  Substance and Sexual Activity  . Alcohol use: No  . Drug use: No  . Sexual activity: Not Currently    Birth control/protection: Post-menopausal  Lifestyle  . Physical activity    Days per week: Not on file    Minutes per session: Not on file  . Stress: Not on file  Relationships  . Social Herbalist on phone: Not on file    Gets together: Not on file    Attends religious service: Not on file    Active member of club or organization: Not on file    Attends meetings of clubs or organizations: Not on file    Relationship status: Not on file  . Intimate partner violence    Fear of current or ex partner: Not on file    Emotionally abused: Not on file    Physically abused: Not on file    Forced sexual activity: Not on file  Other Topics Concern  . Not on file  Social History Narrative  . Not on file    Family History  Problem Relation Age of Onset  . Heart disease Mother   . Glaucoma Mother   . Aneurysm Mother   . Cancer Neg Hx   . Diabetes Neg Hx   . Ovarian cancer Neg Hx      Current Outpatient Medications:  .  aspirin 81 MG chewable tablet, Chew by mouth daily., Disp: , Rfl:  .  diazepam (VALIUM) 5 MG tablet, Take 0.5 mg by mouth daily. , Disp: , Rfl:  .  docusate calcium (STOOL SOFTENER LAXATIVE DC) 240 MG capsule, Take by mouth daily as needed. , Disp: , Rfl:  .  ferrous sulfate 325 (65 FE) MG tablet, Take 325 mg by mouth daily with  breakfast., Disp: , Rfl:  .  fluticasone (FLONASE) 50 MCG/ACT nasal spray, fluticasone propionate 50 mcg/actuation nasal spray,suspension, Disp: , Rfl:  .  hydrochlorothiazide (HYDRODIURIL) 12.5 MG tablet, TAKE ONE TABLET EVERY DAY, Disp: , Rfl:  .  losartan (COZAAR) 50 MG tablet, Take 50 mg by mouth daily., Disp: , Rfl:  .  Multiple Vitamins-Minerals (CENTRUM SILVER PO), Take 1 tablet by mouth daily. , Disp: , Rfl:  .  omeprazole (PRILOSEC) 20 MG capsule, Take 20 mg by mouth daily., Disp: , Rfl:  .  Probiotic Product (GNP PROBIOTIC COLON SUPPORT) CAPS, Take by mouth., Disp: , Rfl:  .  vitamin B-12 (CYANOCOBALAMIN) 1000 MCG tablet, Take 1,000 mcg by mouth daily., Disp: , Rfl:  .  loratadine (CLARITIN) 10 MG tablet, Take  10 mg by mouth daily., Disp: , Rfl:   Physical exam:  Vitals:   03/30/19 1353  BP: (!) 152/80  Pulse: 91  Resp: 18  Temp: 98.9 F (37.2 C)  TempSrc: Tympanic  Weight: 149 lb 6.4 oz (67.8 kg)   Physical Exam Constitutional:      General: She is not in acute distress. HENT:     Head: Normocephalic and atraumatic.  Eyes:     General: No scleral icterus.    Pupils: Pupils are equal, round, and reactive to light.  Neck:     Musculoskeletal: Normal range of motion and neck supple.  Cardiovascular:     Rate and Rhythm: Normal rate and regular rhythm.     Heart sounds: Normal heart sounds.  Pulmonary:     Effort: Pulmonary effort is normal. No respiratory distress.     Breath sounds: No wheezing.  Abdominal:     General: Bowel sounds are normal. There is no distension.     Palpations: Abdomen is soft. There is no mass.     Tenderness: There is no abdominal tenderness.  Musculoskeletal: Normal range of motion.        General: No deformity.  Skin:    General: Skin is warm and dry.     Findings: No erythema or rash.  Neurological:     Mental Status: She is alert and oriented to person, place, and time.     Cranial Nerves: No cranial nerve deficit.      Coordination: Coordination normal.  Psychiatric:        Behavior: Behavior normal.        Thought Content: Thought content normal.     CMP Latest Ref Rng & Units 03/20/2019  Glucose 70 - 99 mg/dL 107(H)  BUN 8 - 23 mg/dL 12  Creatinine 0.44 - 1.00 mg/dL 0.62  Sodium 135 - 145 mmol/L 129(L)  Potassium 3.5 - 5.1 mmol/L 4.3  Chloride 98 - 111 mmol/L 89(L)  CO2 22 - 32 mmol/L 27  Calcium 8.9 - 10.3 mg/dL 9.3  Total Protein 6.5 - 8.1 g/dL -  Total Bilirubin 0.3 - 1.2 mg/dL -  Alkaline Phos 38 - 126 U/L -  AST 15 - 41 U/L -  ALT 0 - 44 U/L -   CBC Latest Ref Rng & Units 02/27/2019  WBC 4.0 - 10.5 K/uL 5.8  Hemoglobin 12.0 - 15.0 g/dL 12.5  Hematocrit 36.0 - 46.0 % 35.5(L)  Platelets 150 - 400 K/uL 277    Dg Chest 2 View  Result Date: 03/20/2019 CLINICAL DATA:  Neutrophilia EXAM: CHEST - 2 VIEW COMPARISON:  09/20/2013 FINDINGS: Cardiac shadows within normal limits. Aortic calcifications are identified. The lungs are hyperinflated consistent with COPD. Mild chronic blunting of the posterior costophrenic angles is seen. No acute infiltrate is noted. IMPRESSION: COPD without acute abnormality. Electronically Signed   By: Inez Catalina M.D.   On: 03/20/2019 16:16     Assessment and plan  1. Normocytic anemia   2. Neutrophilia   Labs reviewed and discussed with patient. Anemia has resolved with hemoglobin of 12.5, neutrophilia has also resolved. Neutrophilia has resolved. Normal reticulocyte hemoglobin.  Normal reticulocyte absolute count Blood smear review morphology unremarkable.  Flow cytometry showed non-diagnostic immunophenotyping abnormality with 41% T cells showed loss of CD7 expression which can be reactive versus secondary to neoplastic process. 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 I discussed with the patient and also per her request,  I called patient's daughter Mindi. T-cell receptor gene rearrangement  detection indicates possible presence of T lymphocytic neoplasm, or a transient clonal proliferation due to surgical viral infection or drug associated cutaneous eruption associated with a dermal lymphoid proliferation process.  Can also be associated with lymphomatoid papulosis or LGL. Patient currently is asymptomatic.  Given her advanced age, I recommend monitoring. Follow-up in 3 months.  Orders Placed This Encounter  Procedures  . Comprehensive metabolic panel    Standing Status:   Future    Standing Expiration Date:   03/29/2020  . CBC with Differential/Platelet    Standing Status:   Future    Standing Expiration Date:   03/29/2020  . Lactate dehydrogenase    Standing Status:   Future    Standing Expiration Date:   03/29/2020      Earlie Server, MD, PhD Hematology Oncology [place] at [facility]  03/31/2019

## 2019-04-01 ENCOUNTER — Encounter: Payer: Self-pay | Admitting: Oncology

## 2019-04-15 ENCOUNTER — Ambulatory Visit: Payer: Medicare Other | Admitting: Oncology

## 2019-06-26 ENCOUNTER — Other Ambulatory Visit: Payer: Self-pay

## 2019-06-26 NOTE — Progress Notes (Signed)
Patient pre screened daughter is requesting that she be able to accompany patient during her appointments due to her multiple disabilities.

## 2019-06-29 ENCOUNTER — Inpatient Hospital Stay: Payer: Medicare Other | Attending: Oncology

## 2019-06-29 ENCOUNTER — Inpatient Hospital Stay (HOSPITAL_BASED_OUTPATIENT_CLINIC_OR_DEPARTMENT_OTHER): Payer: Medicare Other | Admitting: Oncology

## 2019-06-29 ENCOUNTER — Other Ambulatory Visit: Payer: Self-pay

## 2019-06-29 VITALS — BP 160/84 | HR 84 | Temp 97.7°F | Resp 16 | Wt 148.6 lb

## 2019-06-29 DIAGNOSIS — Z7982 Long term (current) use of aspirin: Secondary | ICD-10-CM | POA: Diagnosis not present

## 2019-06-29 DIAGNOSIS — Z79899 Other long term (current) drug therapy: Secondary | ICD-10-CM | POA: Diagnosis not present

## 2019-06-29 DIAGNOSIS — D649 Anemia, unspecified: Secondary | ICD-10-CM

## 2019-06-29 DIAGNOSIS — F17210 Nicotine dependence, cigarettes, uncomplicated: Secondary | ICD-10-CM | POA: Insufficient documentation

## 2019-06-29 LAB — COMPREHENSIVE METABOLIC PANEL
ALT: 15 U/L (ref 0–44)
AST: 25 U/L (ref 15–41)
Albumin: 4.4 g/dL (ref 3.5–5.0)
Alkaline Phosphatase: 76 U/L (ref 38–126)
Anion gap: 8 (ref 5–15)
BUN: 12 mg/dL (ref 8–23)
CO2: 27 mmol/L (ref 22–32)
Calcium: 8.9 mg/dL (ref 8.9–10.3)
Chloride: 89 mmol/L — ABNORMAL LOW (ref 98–111)
Creatinine, Ser: 0.59 mg/dL (ref 0.44–1.00)
GFR calc Af Amer: >60 mL/min (ref >60)
GFR calc non Af Amer: >60 mL/min (ref >60)
Glucose, Bld: 119 mg/dL — ABNORMAL HIGH (ref 70–99)
Potassium: 3.9 mmol/L (ref 3.5–5.1)
Sodium: 124 mmol/L — ABNORMAL LOW (ref 135–145)
Total Bilirubin: 0.5 mg/dL (ref 0.3–1.2)
Total Protein: 7.2 g/dL (ref 6.5–8.1)

## 2019-06-29 LAB — CBC WITH DIFFERENTIAL/PLATELET
Abs Immature Granulocytes: 0.03 10*3/uL (ref 0.00–0.07)
Basophils Absolute: 0 10*3/uL (ref 0.0–0.1)
Basophils Relative: 1 %
Eosinophils Absolute: 0.1 10*3/uL (ref 0.0–0.5)
Eosinophils Relative: 1 %
HCT: 36.1 % (ref 36.0–46.0)
Hemoglobin: 12.5 g/dL (ref 12.0–15.0)
Immature Granulocytes: 1 %
Lymphocytes Relative: 17 %
Lymphs Abs: 1.1 10*3/uL (ref 0.7–4.0)
MCH: 33.3 pg (ref 26.0–34.0)
MCHC: 34.6 g/dL (ref 30.0–36.0)
MCV: 96.3 fL (ref 80.0–100.0)
Monocytes Absolute: 0.5 10*3/uL (ref 0.1–1.0)
Monocytes Relative: 8 %
Neutro Abs: 4.6 10*3/uL (ref 1.7–7.7)
Neutrophils Relative %: 72 %
Platelets: 277 10*3/uL (ref 150–400)
RBC: 3.75 MIL/uL — ABNORMAL LOW (ref 3.87–5.11)
RDW: 12 % (ref 11.5–15.5)
WBC: 6.3 10*3/uL (ref 4.0–10.5)
nRBC: 0 % (ref 0.0–0.2)

## 2019-06-29 LAB — LACTATE DEHYDROGENASE: LDH: 102 U/L (ref 98–192)

## 2019-06-29 NOTE — Progress Notes (Signed)
Patient was pre-assessed for MD visit. 

## 2019-06-30 ENCOUNTER — Encounter: Payer: Self-pay | Admitting: Oncology

## 2019-06-30 NOTE — Progress Notes (Signed)
Hematology/Oncology Follow Up Note [facility]  [phone] [phone]  Patient Care Team: Derinda Late, MD as PCP - General (Family Medicine)   Name of the patient: Allison Hampton  [id number]  April 25, 1928   REASON FOR VISIT  discussion of lab work-up.  PERTINENT HEMATOLOGY HISTORY Daana Petrasek Dicesare is a 85 y.o.Marland Kitchen female who was seen in consultation at the request of Derinda Late, MD for evaluation of anemia, increased reticulocyte and a neutrophilia. Patient reports doing well today.  Energy level is at baseline.  No change. She has a history of AVM, with chronic iron deficiency anemia, believed to be secondary to chronic blood loss secondary to AVM. Her last colonoscopy was 2015.  She had perforation secondary to colonoscopy and elected not to have any additional colonoscopy. Denies weight loss, fever, chills, fatigue, night sweats.  Denies hematochezia, hematuria, hematemesis, epistaxis, black tarry stool or easy bruising.   I reviewed patient's most recent labs done on 01/16/2019 at [facility] clinic. CBC showed hemoglobin 11.9, white blood cell 5, platelet counts 269, reticulocyte percentage 4.4%, absolute reticulocyte count  0.153. Differential shows decreased absolute lymphocyte count at point 8 9, lymphocyte percentage normal at 11.9%, increased neutrophil 71.2%, mildly decreased eosinophil 0.4%, Iron panel shows iron 146, TIBC 364, iron saturation 40%, ferritin 87. CRP less than 1, vitamin B12 984.  ESR 28, Stool Hemoccult positive She has a history of chronic hyponatremia.  Asymptomatic.  INTERVAL HISTORY 84 y.o. female present to discuss lab work-up results for neutrophilia, normocytic anemia and reticulocytosis. Today patient reports doing well. She denies any new complaints. Denies weight loss, fever, chills, fatigue, night sweats.  Remains quite active.  Review of Systems  Constitutional: Negative for appetite change,  chills, fatigue and fever.  HENT:   Negative for hearing loss and voice change.   Eyes: Negative for eye problems.  Respiratory: Negative for chest tightness and cough.   Cardiovascular: Negative for chest pain.  Gastrointestinal: Negative for abdominal distention, abdominal pain and blood in stool.  Endocrine: Negative for hot flashes.  Genitourinary: Negative for difficulty urinating and frequency.   Musculoskeletal: Negative for arthralgias.  Skin: Negative for itching and rash.  Neurological: Negative for extremity weakness.  Hematological: Negative for adenopathy.  Psychiatric/Behavioral: Negative for confusion.      Allergies  Allergen Reactions  . Cetirizine Other (See Comments)    tachycardia  . Ciprofloxacin   . Codeine   . Flagyl [Metronidazole]   . Iodinated Diagnostic Agents   . Moxifloxacin Hcl In Nacl Other (See Comments)    hallucination  . Penicillins     Has patient had a PCN reaction causing immediate rash, facial/tongue/throat swelling, SOB or lightheadedness with hypotension: yes Has patient had a PCN reaction causing severe rash involving mucus membranes or skin necrosis: yes Has patient had a PCN reaction that required hospitalization no Has patient had a PCN reaction occurring within the last 10 years: no If all of the above answers are "NO", then may proceed with Cephalosporin use.   . Shellfish Allergy   . Statins Other (See Comments)    myalgias, unspecified  . Sulfa Antibiotics      Past Medical History:  Diagnosis Date  . Abdominal mass   . Atrophic vaginitis   . Colitis, ischemic ([place])   . GERD (gastroesophageal reflux disease)   . Pelvic pain in female   . Pyometra   . Uterine mass      Past Surgical History:  Procedure Laterality  Date  . adenoids    . cataract surgery    . CHOLECYSTECTOMY    . TONSILLECTOMY      Social History   Socioeconomic History  . Marital status: Widowed    Spouse name: Not on file  . Number of  children: Not on file  . Years of education: Not on file  . Highest education level: Not on file  Occupational History  . Not on file  Social Needs  . Financial resource strain: Not on file  . Food insecurity    Worry: Not on file    Inability: Not on file  . Transportation needs    Medical: Not on file    Non-medical: Not on file  Tobacco Use  . Smoking status: Light Tobacco Smoker    Packs/day: 0.25    Types: Cigarettes  . Smokeless tobacco: Never Used  . Tobacco comment: 8 cigarettes a day  Substance and Sexual Activity  . Alcohol use: No  . Drug use: No  . Sexual activity: Not Currently    Birth control/protection: Post-menopausal  Lifestyle  . Physical activity    Days per week: Not on file    Minutes per session: Not on file  . Stress: Not on file  Relationships  . Social Herbalist on phone: Not on file    Gets together: Not on file    Attends religious service: Not on file    Active member of club or organization: Not on file    Attends meetings of clubs or organizations: Not on file    Relationship status: Not on file  . Intimate partner violence    Fear of current or ex partner: Not on file    Emotionally abused: Not on file    Physically abused: Not on file    Forced sexual activity: Not on file  Other Topics Concern  . Not on file  Social History Narrative  . Not on file    Family History  Problem Relation Age of Onset  . Heart disease Mother   . Glaucoma Mother   . Aneurysm Mother   . Cancer Neg Hx   . Diabetes Neg Hx   . Ovarian cancer Neg Hx      Current Outpatient Medications:  .  aspirin 81 MG chewable tablet, Chew by mouth daily., Disp: , Rfl:  .  diazepam (VALIUM) 5 MG tablet, Take 0.5 mg by mouth daily. , Disp: , Rfl:  .  docusate calcium (STOOL SOFTENER LAXATIVE DC) 240 MG capsule, Take by mouth daily as needed. , Disp: , Rfl:  .  ferrous sulfate 325 (65 FE) MG tablet, Take 325 mg by mouth daily with breakfast., Disp: ,  Rfl:  .  fluticasone (FLONASE) 50 MCG/ACT nasal spray, fluticasone propionate 50 mcg/actuation nasal spray,suspension, Disp: , Rfl:  .  hydrochlorothiazide (HYDRODIURIL) 12.5 MG tablet, TAKE ONE TABLET EVERY DAY, Disp: , Rfl:  .  loratadine (CLARITIN) 10 MG tablet, Take 10 mg by mouth daily., Disp: , Rfl:  .  losartan (COZAAR) 50 MG tablet, Take 50 mg by mouth daily., Disp: , Rfl:  .  Multiple Vitamins-Minerals (CENTRUM SILVER PO), Take 1 tablet by mouth daily. , Disp: , Rfl:  .  omeprazole (PRILOSEC) 20 MG capsule, Take 20 mg by mouth daily., Disp: , Rfl:  .  Probiotic Product (GNP PROBIOTIC COLON SUPPORT) CAPS, Take by mouth., Disp: , Rfl:  .  vitamin B-12 (CYANOCOBALAMIN) 1000 MCG tablet, Take 1,000 mcg  by mouth daily., Disp: , Rfl:   Physical exam:  Vitals:   06/29/19 1034  BP: (!) 160/84  Pulse: 84  Resp: 16  Temp: 97.7 F (36.5 C)  Weight: 148 lb 9.6 oz (67.4 kg)   Physical Exam Constitutional:      General: She is not in acute distress. HENT:     Head: Normocephalic and atraumatic.  Eyes:     General: No scleral icterus.    Pupils: Pupils are equal, round, and reactive to light.  Neck:     Musculoskeletal: Normal range of motion and neck supple.  Cardiovascular:     Rate and Rhythm: Normal rate and regular rhythm.     Heart sounds: Normal heart sounds.  Pulmonary:     Effort: Pulmonary effort is normal. No respiratory distress.     Breath sounds: No wheezing.  Abdominal:     General: Bowel sounds are normal. There is no distension.     Palpations: Abdomen is soft. There is no mass.     Tenderness: There is no abdominal tenderness.  Musculoskeletal: Normal range of motion.        General: No deformity.  Skin:    General: Skin is warm and dry.     Findings: No erythema or rash.  Neurological:     Mental Status: She is alert and oriented to person, place, and time.     Cranial Nerves: No cranial nerve deficit.     Coordination: Coordination normal.  Psychiatric:         Behavior: Behavior normal.        Thought Content: Thought content normal.     CMP Latest Ref Rng & Units 06/29/2019  Glucose 70 - 99 mg/dL 119(H)  BUN 8 - 23 mg/dL 12  Creatinine 0.44 - 1.00 mg/dL 0.59  Sodium 135 - 145 mmol/L 124(L)  Potassium 3.5 - 5.1 mmol/L 3.9  Chloride 98 - 111 mmol/L 89(L)  CO2 22 - 32 mmol/L 27  Calcium 8.9 - 10.3 mg/dL 8.9  Total Protein 6.5 - 8.1 g/dL 7.2  Total Bilirubin 0.3 - 1.2 mg/dL 0.5  Alkaline Phos 38 - 126 U/L 76  AST 15 - 41 U/L 25  ALT 0 - 44 U/L 15   CBC Latest Ref Rng & Units 06/29/2019  WBC 4.0 - 10.5 K/uL 6.3  Hemoglobin 12.0 - 15.0 g/dL 12.5  Hematocrit 36.0 - 46.0 % 36.1  Platelets 150 - 400 K/uL 277    No results found.   Assessment and plan  1. Normocytic anemia   Labs reviewed and discussed with patient. Anemia has resolved with a hemoglobin stable around 12.5.  Neutrophilia has completely resolved. 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 Discussed with patient and also called her daughter Leslye Peer that T-cell receptor gene rearrangement indicates possible presence of T lymphocytic neoplasm or transient clonal proliferation due to infection/drug, underlying bone marrow malignancy. Patient is currently asymptomatic.  She is not interested in aggressive diagnostic procedures. She does not wish to receive any chemotherapy treatments even if she has cancer. Decision was made not to proceed with additional work-up at this point.  She is doing well.  Continue monitor. Follow-up in 6 months.  Orders Placed This Encounter  Procedures  . CBC with Differential/Platelet    Standing Status:   Future    Standing Expiration Date:  06/28/2020  . Comprehensive metabolic panel    Standing Status:   Future    Standing Expiration Date:   06/28/2020  . Flow  cytometry panel-leukemia/lymphoma work-up    Standing Status:   Future    Standing Expiration Date:   06/28/2020      Earlie Server, MD, PhD Hematology Oncology [place] at [facility]  06/30/2019

## 2019-07-11 ENCOUNTER — Other Ambulatory Visit: Payer: Self-pay

## 2019-07-11 ENCOUNTER — Encounter: Payer: Self-pay | Admitting: Emergency Medicine

## 2019-07-11 ENCOUNTER — Emergency Department
Admission: EM | Admit: 2019-07-11 | Discharge: 2019-07-11 | Disposition: A | Discharge status: Home / Self Care | Payer: Medicare Other | Attending: Emergency Medicine | Admitting: Emergency Medicine

## 2019-07-11 DIAGNOSIS — Z7982 Long term (current) use of aspirin: Secondary | ICD-10-CM | POA: Diagnosis not present

## 2019-07-11 DIAGNOSIS — F17210 Nicotine dependence, cigarettes, uncomplicated: Secondary | ICD-10-CM | POA: Diagnosis not present

## 2019-07-11 DIAGNOSIS — Z79899 Other long term (current) drug therapy: Secondary | ICD-10-CM | POA: Diagnosis not present

## 2019-07-11 DIAGNOSIS — I10 Essential (primary) hypertension: Secondary | ICD-10-CM

## 2019-07-11 LAB — BASIC METABOLIC PANEL
Anion gap: 10 (ref 5–15)
BUN: 9 mg/dL (ref 8–23)
CO2: 27 mmol/L (ref 22–32)
Calcium: 8.7 mg/dL — ABNORMAL LOW (ref 8.9–10.3)
Chloride: 93 mmol/L — ABNORMAL LOW (ref 98–111)
Creatinine, Ser: 0.5 mg/dL (ref 0.44–1.00)
GFR calc Af Amer: >60 mL/min (ref >60)
GFR calc non Af Amer: >60 mL/min (ref >60)
Glucose, Bld: 123 mg/dL — ABNORMAL HIGH (ref 70–99)
Potassium: 4.3 mmol/L (ref 3.5–5.1)
Sodium: 130 mmol/L — ABNORMAL LOW (ref 135–145)

## 2019-07-11 LAB — CBC
HCT: 35.5 % — ABNORMAL LOW (ref 36.0–46.0)
Hemoglobin: 12.3 g/dL (ref 12.0–15.0)
MCH: 33.4 pg (ref 26.0–34.0)
MCHC: 34.6 g/dL (ref 30.0–36.0)
MCV: 96.5 fL (ref 80.0–100.0)
Platelets: 218 10*3/uL (ref 150–400)
RBC: 3.68 MIL/uL — ABNORMAL LOW (ref 3.87–5.11)
RDW: 12 % (ref 11.5–15.5)
WBC: 5 10*3/uL (ref 4.0–10.5)
nRBC: 0 % (ref 0.0–0.2)

## 2019-07-11 LAB — TROPONIN I (HIGH SENSITIVITY)
Troponin I (High Sensitivity): 8 ng/L (ref <18)
Troponin I (High Sensitivity): 9 ng/L (ref <18)

## 2019-07-11 MED ORDER — METOPROLOL TARTRATE 5 MG/5ML IV SOLN
5.0000 mg | Freq: Once | INTRAVENOUS | Status: AC
  Administered 2019-07-11: 5 mg via INTRAVENOUS
  Filled 2019-07-11: qty 5

## 2019-07-11 NOTE — ED Notes (Signed)
ED Provider at bedside. 

## 2019-07-11 NOTE — ED Triage Notes (Addendum)
Pt arrived via POV with reports of elevated blood pressure, heart racing and feeling "woozy"  Pt reports she was taken off her BP medication Losartan and a fluid pill 2 days ago and put on Atenolol. Pt states she has taken 2 doses of Atenolol and states sxs began around 3pm of heart racing. Pt states she was taken off her BP meds due to sodium dropping too low.  Pt is HOH, denies any pain.  Pt states she also had a flu shot on Thursday

## 2019-07-11 NOTE — ED Provider Notes (Signed)
[facility] Emergency Department Provider Note   ____________________________________________   I have reviewed the triage vital signs and the nursing notes.   HISTORY  Chief Complaint Hypertension   History limited by: Not Limited   HPI Allison Hampton is a 85 y.o. female who presents to the emergency department today with primary concern for high blood pressure. The patient states that she had her blood pressure medication recently changed. Today she started feeling some abnormal sensation in her head, some shortness of breath and took her blood pressure. She did notice it was elevated. Did have systolic reading over [id number]. She then became concerned for stroke so presented to the emergency department.    Records reviewed. Per medical record review patient has a history of GERD  Past Medical History:  Diagnosis Date  . Abdominal mass   . Atrophic vaginitis   . Colitis, ischemic ([place])   . GERD (gastroesophageal reflux disease)   . Pelvic pain in female   . Pyometra   . Uterine mass     Patient Active Problem List   Diagnosis Date Noted  . Tobacco use disorder 08/31/2016  . Ischemic colitis ([place]) 08/31/2016  . Melena 07/17/2016  . Acute GI bleeding   . GERD (gastroesophageal reflux disease) 08/17/2015  . History of vaginal discharge 08/17/2015  . Menopause 08/17/2015  . Urethral caruncle 08/17/2015  . Vaginal atrophy 08/17/2015  . Chronic anxiety 08/17/2015  . Chronic obstructive pulmonary disease ([place]) 08/17/2015  . Benign essential HTN 08/06/2014  . Absolute anemia 02/09/2014    Past Surgical History:  Procedure Laterality Date  . adenoids    . cataract surgery    . CHOLECYSTECTOMY    . TONSILLECTOMY      Prior to Admission medications   Medication Sig Start Date End Date Taking? Authorizing Provider  aspirin 81 MG chewable tablet Chew by mouth daily.    [provider]  diazepam (VALIUM) 5 MG tablet Take 0.5 mg by  mouth daily.  08/10/15   [provider]  docusate calcium (STOOL SOFTENER LAXATIVE DC) 240 MG capsule Take by mouth daily as needed.     [provider]  ferrous sulfate 325 (65 FE) MG tablet Take 325 mg by mouth daily with breakfast.    [provider]  fluticasone (FLONASE) 50 MCG/ACT nasal spray fluticasone propionate 50 mcg/actuation nasal spray,suspension 07/03/18   [provider]  hydrochlorothiazide (HYDRODIURIL) 12.5 MG tablet TAKE ONE TABLET EVERY DAY 01/16/19   [provider]  loratadine (CLARITIN) 10 MG tablet Take 10 mg by mouth daily.    [provider]  losartan (COZAAR) 50 MG tablet Take 50 mg by mouth daily.    [provider]  Multiple Vitamins-Minerals (CENTRUM SILVER PO) Take 1 tablet by mouth daily.     [provider]  omeprazole (PRILOSEC) 20 MG capsule Take 20 mg by mouth daily. 01/15/19   [provider]  Probiotic Product (GNP PROBIOTIC COLON SUPPORT) CAPS Take by mouth.    [provider]  vitamin B-12 (CYANOCOBALAMIN) 1000 MCG tablet Take 1,000 mcg by mouth daily.    [provider]    Allergies Cetirizine, Ciprofloxacin, Codeine, Flagyl [metronidazole], Iodinated diagnostic agents, Moxifloxacin hcl in nacl, Penicillins, Shellfish allergy, Statins, and Sulfa antibiotics  Family History  Problem Relation Age of Onset  . Heart disease Mother   . Glaucoma Mother   . Aneurysm Mother   . Cancer Neg Hx   . Diabetes Neg Hx   .  Ovarian cancer Neg Hx     Social History Social History   Tobacco Use  . Smoking status: Light Tobacco Smoker    Packs/day: 0.25    Types: Cigarettes  . Smokeless tobacco: Never Used  . Tobacco comment: 8 cigarettes a day  Substance Use Topics  . Alcohol use: No  . Drug use: No    Review of Systems Constitutional: No fever/chills Eyes: No visual changes. ENT: No sore throat. Cardiovascular: Denies chest pain. Respiratory: Positive  for shortness of breath. Gastrointestinal: No abdominal pain.  No nausea, no vomiting.  No diarrhea.   Genitourinary: Negative for dysuria. Musculoskeletal: Negative for back pain. Skin: Negative for rash. Neurological: Abnormal sensation to her head. ____________________________________________   PHYSICAL EXAM:  VITAL SIGNS: ED Triage Vitals [07/11/19 1647]  Enc Vitals Group     BP (!) 195/77     Pulse Rate 78     Resp 18     Temp 97.8 F (36.6 C)     Temp Source Oral     SpO2 97 %     Weight 147 lb (66.7 kg)     Height 5\' 5"  (1.651 m)     Head Circumference      Peak Flow      Pain Score 0   Constitutional: Alert and oriented.  Eyes: Conjunctivae are normal.  ENT      Head: Normocephalic and atraumatic.      Nose: No congestion/rhinnorhea.      Mouth/Throat: Mucous membranes are moist.      Neck: No stridor. Hematological/Lymphatic/Immunilogical: No cervical lymphadenopathy. Cardiovascular: Normal rate, regular rhythm.  No murmurs, rubs, or gallops.  Respiratory: Normal respiratory effort without tachypnea nor retractions. Breath sounds are clear and equal bilaterally. No wheezes/rales/rhonchi. Gastrointestinal: Soft and non tender. No rebound. No guarding.  Genitourinary: Deferred Musculoskeletal: Normal range of motion in all extremities. No lower extremity edema. Neurologic:  Normal speech and language. No gross focal neurologic deficits are appreciated.  Skin:  Skin is warm, dry and intact. No rash noted. Psychiatric: Mood and affect are normal. Speech and behavior are normal. Patient exhibits appropriate insight and judgment.  ____________________________________________    LABS (pertinent positives/negatives)  Trop hs 9 CBC wbc 5.0, hgb 12.3, plt 218 BMP wnl except na 130, cl 93, glu 123, cr 0.50  ____________________________________________   EKG  I, Nance Pear, attending physician, personally viewed and interpreted this EKG  EKG Time:  1659 Rate: 69 Rhythm: normal sinus rhythm Axis: normal Intervals: qtc 441 QRS: narrow ST changes: no st elevation Impression: normal ekg  ____________________________________________    RADIOLOGY  None  ____________________________________________   PROCEDURES  Procedures  ____________________________________________   INITIAL IMPRESSION / ASSESSMENT AND PLAN / ED COURSE  Pertinent labs & imaging results that were available during my care of the patient were reviewed by me and considered in my medical decision making (see chart for details).   Patient presented to the emergency department today with primary concern for high blood pressure.  On exam patient blood pressure was elevated.  No evidence of endorgan damage on the blood work.  Patient was given some medication here which did help improve her blood pressure.  Did have a long discussion with the patient about blood pressure and goals of management.  This point no concerning signs for acute damage.  Discussed with patient portance of following up with primary care.  ____________________________________________   FINAL CLINICAL IMPRESSION(S) / ED DIAGNOSES  Final diagnoses:  Hypertension, unspecified type  Note: This dictation was prepared with Dragon dictation. Any transcriptional errors that result from this process are unintentional     Nance Pear, MD 07/11/19 2318

## 2019-07-11 NOTE — Discharge Instructions (Addendum)
Please seek medical attention for any high fevers, chest pain, shortness of breath, change in behavior, persistent vomiting, bloody stool or any other new or concerning symptoms.  

## 2019-11-19 ENCOUNTER — Telehealth: Payer: Self-pay | Admitting: *Deleted

## 2019-11-19 NOTE — Telephone Encounter (Signed)
Pt daughter [name] called to cancel her mother's 12/21/19 lab/MD appts and stated that she will call back at a later date to R/S if needed

## 2019-12-21 ENCOUNTER — Ambulatory Visit (INDEPENDENT_AMBULATORY_CARE_PROVIDER_SITE_OTHER): Payer: Medicare Other | Admitting: Dermatology

## 2019-12-21 ENCOUNTER — Encounter: Payer: Self-pay | Admitting: Dermatology

## 2019-12-21 ENCOUNTER — Other Ambulatory Visit: Payer: Medicare Other

## 2019-12-21 ENCOUNTER — Other Ambulatory Visit: Payer: Self-pay

## 2019-12-21 DIAGNOSIS — L578 Other skin changes due to chronic exposure to nonionizing radiation: Secondary | ICD-10-CM | POA: Diagnosis not present

## 2019-12-21 DIAGNOSIS — L820 Inflamed seborrheic keratosis: Secondary | ICD-10-CM | POA: Diagnosis not present

## 2019-12-21 DIAGNOSIS — L565 Disseminated superficial actinic porokeratosis (DSAP): Secondary | ICD-10-CM | POA: Diagnosis not present

## 2019-12-21 DIAGNOSIS — C441122 Basal cell carcinoma of skin of right lower eyelid, including canthus: Secondary | ICD-10-CM | POA: Diagnosis not present

## 2019-12-21 NOTE — Progress Notes (Signed)
   Follow-Up Visit   Subjective  Allison Hampton is a 86 y.o. female who presents for the following: BCC bx proven (R mid lower eyelid, Bx by Dr.Porfilio did bx and pt has referral at [facility] this week for txt) and check spots (scalp, R hand dorsum, face itchy, has had them ~7m).   The following portions of the chart were reviewed this encounter and updated as appropriate:      Review of Systems:  No other skin or systemic complaints except as noted in HPI or Assessment and Plan.  Objective  Well appearing patient in no apparent distress; mood and affect are within normal limits.  A focused examination was performed including scalp, face. Relevant physical exam findings are noted in the Assessment and Plan.  Objective  Right med. Lower Eyelid: Pearly pink pap 0.90mm  Objective  arms, legs: Pink brown scaly patches with keratotic rim  Objective  scalp x 3, R hand dorsum x 1, R forehead x 1 (5): Erythematous keratotic or waxy stuck-on papule    Assessment & Plan   Actinic Damage - diffuse scaly erythematous macules with underlying dyspigmentation - Recommend daily broad spectrum sunscreen SPF 30+ to sun-exposed areas, reapply every 2 hours as needed.  - Call for new or changing lesions.   Basal cell carcinoma (BCC) of skin of right lower eyelid including canthus Right med. Lower Eyelid  Bx performed by Dr. George Ina, and pt has been referred to [facility] eye surgeon, has an appointment 12/22/19 for consult.  DSAP (disseminated superficial actinic porokeratosis) arms, legs  Start Cholesterol/Lovastatin oint bid to arms until rash cleared. Once finished with arms, may start on legs bid until cleared.  (medication sent to [facility])  Inflamed seborrheic keratosis (5) scalp x 3, R hand dorsum x 1, R forehead x 1  Destruction of lesion - scalp x 3, R hand dorsum x 1, R forehead x 1  Destruction method: cryotherapy   Informed consent: discussed and consent obtained     Lesion destroyed using liquid nitrogen: Yes   Region frozen until ice ball extended beyond lesion: Yes   Outcome: patient tolerated procedure well with no complications   Post-procedure details: wound care instructions given    Return in about 3 months (around 03/21/2020) for DSAP, BCC eyelid.    Documentation: I have reviewed the above documentation for accuracy and completeness, and I agree with the above.  Brendolyn Patty, MD   I, Othelia Pulling, RMA, am acting as scribe for Brendolyn Patty, MD .

## 2019-12-21 NOTE — Patient Instructions (Addendum)
Wound Care Instructions  1. Cleanse wound gently with soap and water once a day then pat dry with clean gauze. Apply a thing coat of Petrolatum (petroleum jelly, "Vaseline") over the wound (unless you have an allergy to this). We recommend that you use a new, sterile tube of Vaseline. Do not pick or remove scabs. Do not remove the yellow or white "healing tissue" from the base of the wound.  2. Cover the wound with fresh, clean, nonstick gauze and secure with paper tape. You may use Band-Aids in place of gauze and tape if the would is small enough, but would recommend trimming much of the tape off as there is often too much. Sometimes Band-Aids can irritate the skin.  3. You should call the office for your biopsy report after 1 week if you have not already been contacted.  4. If you experience any problems, such as abnormal amounts of bleeding, swelling, significant bruising, significant pain, or evidence of infection, please call the office immediately.  FOR ADULT SURGERY PATIENTS: If you need something for pain relief you may take 1 extra strength Tylenol (acetaminophen) AND 2 Ibuprofen (200mg  each) together every 4 hours as needed for pain. (do not take these if you are allergic to them or if you have a reason you should not take them.) Typically, you may only need pain medication for 1 to 3 days.   Instructions for [facility] Medications  One or more of your medications was sent to the [facility] mail order compounding pharmacy. You will receive an email from them and can purchase the medicine through that link. It will then be mailed to your home at the address you confirmed. If for any reason you do not receive an email from them, please check your spam folder. If you still do not find the email, please let us know.   [facility] phone number [phone]

## 2019-12-28 ENCOUNTER — Ambulatory Visit: Payer: Medicare Other | Admitting: Oncology

## 2020-01-14 ENCOUNTER — Telehealth: Payer: Self-pay

## 2020-01-14 NOTE — Telephone Encounter (Signed)
[name], patients daughter, called today and wanted to let you know about her [facility] procedure with Dr. Manley Mason yesterday. She was in surgery for 10 hours and they cut 7 times with no clear margins. Dr. Manley Mason has referred her to [facility]. She had another procedure this morning. You can see her notes in her Chart review from both [place] and [place]. [name] states patient is home now and resting. She goes next Tuesday for her reconstruction surgery.

## 2020-01-19 ENCOUNTER — Telehealth: Payer: Self-pay

## 2020-01-19 NOTE — Telephone Encounter (Signed)
Pt daughter [name] returning your call, pt is home resting today, you can call [name] today if you would like.

## 2020-01-19 NOTE — Telephone Encounter (Signed)
Called Allison Hampton and talked to her and [name], who is recovering from eyelid reconstruction surgery that she had yesterday at [facility].  The sebaceous carcinoma has spread from the R lower/upper eyelids to her eye (no clear margins with Mohs surgery) and she will be getting chemo drops to manage it, in order to preserve her eye and vision.

## 2020-02-23 ENCOUNTER — Other Ambulatory Visit: Payer: Self-pay

## 2020-02-23 ENCOUNTER — Encounter: Payer: Self-pay | Admitting: Dermatology

## 2020-02-23 ENCOUNTER — Ambulatory Visit (INDEPENDENT_AMBULATORY_CARE_PROVIDER_SITE_OTHER): Payer: Medicare Other | Admitting: Dermatology

## 2020-02-23 DIAGNOSIS — C44131 Sebaceous cell carcinoma of skin of unspecified eyelid, including canthus: Secondary | ICD-10-CM | POA: Diagnosis not present

## 2020-02-23 DIAGNOSIS — L578 Other skin changes due to chronic exposure to nonionizing radiation: Secondary | ICD-10-CM | POA: Diagnosis not present

## 2020-02-23 DIAGNOSIS — L565 Disseminated superficial actinic porokeratosis (DSAP): Secondary | ICD-10-CM | POA: Diagnosis not present

## 2020-02-23 DIAGNOSIS — L570 Actinic keratosis: Secondary | ICD-10-CM | POA: Diagnosis not present

## 2020-02-23 NOTE — Patient Instructions (Signed)
Cryotherapy Aftercare  . Wash gently with soap and water everyday.   . Apply Vaseline and Band-Aid daily until healed.  

## 2020-02-23 NOTE — Progress Notes (Signed)
   Follow-Up Visit   Subjective  Allison Hampton is a 86 y.o. female who presents for the following: roughness (nose x several months, using moisturizer) and spots (bil legs, scaly). Had Sebaceous Carcinoma excised from R eyelid by Mohs, positive margins with eyelid reconstruction. She is still healing from the surgery.  Scouting bxs afterwards were negative.   The following portions of the chart were reviewed this encounter and updated as appropriate:      Review of Systems:  No other skin or systemic complaints except as noted in HPI or Assessment and Plan.  Objective  Well appearing patient in no apparent distress; mood and affect are within normal limits.  A focused examination was performed including nose. Relevant physical exam findings are noted in the Assessment and Plan.  Objective  Nasal tip x 1, Left nasal dorsum x 1, Left nasal ala x 1 (3): Erythematous thin papules/macules with gritty scale.   Left Medial Calf x 1, Right medial lower leg x 2, Right pretibia x 1 (4): Keratotic scaly papules.  Objective  Right Eye: Healing post surgery, appears clear   Assessment & Plan   Actinic Damage with DSAP - diffuse scaly erythematous macules with underlying dyspigmentation - Recommend daily broad spectrum sunscreen SPF 30+ to sun-exposed areas, reapply every 2 hours as needed.  - Call for new or changing lesions.   AK (actinic keratosis) (7) Left Medial Calf x 1, Right medial lower leg x 2, Right pretibia x 1 (4); Nasal tip x 1, Left nasal dorsum x 1, Left nasal ala x 1 (3)  Hypertrophic on L medial calf, R pretibia, R med lower leg. Recheck on f/u.  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 Observe for recurrence. Pt has appt at [facility] in few weeks to possibly start chemo drops for eye.  Call clinic for new or changing lesions.    Return in about 2 months (around 04/24/2020) for AKs. Cancel July 2021 appt.Lindi Adie, CMA, am acting as scribe for Brendolyn Patty, MD .  Documentation: I have reviewed the above documentation for accuracy and completeness, and I agree with the above.  Brendolyn Patty MD

## 2020-03-22 ENCOUNTER — Ambulatory Visit: Payer: Medicare Other | Admitting: Dermatology

## 2020-04-08 ENCOUNTER — Other Ambulatory Visit: Payer: Self-pay | Admitting: Family Medicine

## 2020-04-08 ENCOUNTER — Other Ambulatory Visit (HOSPITAL_COMMUNITY): Payer: Self-pay | Admitting: Family Medicine

## 2020-04-08 DIAGNOSIS — E041 Nontoxic single thyroid nodule: Secondary | ICD-10-CM

## 2020-04-08 DIAGNOSIS — R911 Solitary pulmonary nodule: Secondary | ICD-10-CM

## 2020-04-13 ENCOUNTER — Ambulatory Visit (INDEPENDENT_AMBULATORY_CARE_PROVIDER_SITE_OTHER): Payer: Medicare Other | Admitting: Dermatology

## 2020-04-13 ENCOUNTER — Other Ambulatory Visit: Payer: Self-pay

## 2020-04-13 DIAGNOSIS — L089 Local infection of the skin and subcutaneous tissue, unspecified: Secondary | ICD-10-CM | POA: Diagnosis not present

## 2020-04-13 DIAGNOSIS — S81801A Unspecified open wound, right lower leg, initial encounter: Secondary | ICD-10-CM

## 2020-04-13 MED ORDER — DOXYCYCLINE MONOHYDRATE 100 MG PO TABS: 100.0000 mg | ORAL_TABLET | Freq: Two times a day (BID) | ORAL | 0 refills | Status: AC

## 2020-04-13 MED ORDER — MUPIROCIN 2 % EX OINT: 1.0000 "application " | TOPICAL_OINTMENT | Freq: Every day | CUTANEOUS | 1 refills | Status: DC

## 2020-04-13 NOTE — Progress Notes (Signed)
   Follow-Up Visit   Subjective  Allison Hampton is a 86 y.o. female who presents for the following: Skin Problem (AK's treated 02/23/20 on right leg have not healed well. Draining and tender. ).    The following portions of the chart were reviewed this encounter and updated as appropriate:     Review of Systems: No other skin or systemic complaints except as noted in HPI or Assessment and Plan.  Objective  Well appearing patient in no apparent distress; mood and affect are within normal limits.  A focused examination was performed including lower extremities, including the legs, feet, toes, and toenails. Relevant physical exam findings are noted in the Assessment and Plan.  Objective  Right Lower Leg - Posterior: Erythema with 2 crusted eroded patches  Images    Assessment & Plan  Wound infection Right Lower Leg - Posterior  Cutaneous ulcerations Wound care discussed. Mupirocin ointment and Bandage applied Will recheck on f/up, consider biopsy if not improved on f/up  mupirocin ointment (BACTROBAN) 2 % - Right Lower Leg - Posterior  doxycycline (ADOXA) 100 MG tablet - Right Lower Leg - Posterior  Return for as scheduled 2-3 wks.   I, Emelia Salisbury, CMA, am acting as scribe for Brendolyn Patty, MD.  Documentation: I have reviewed the above documentation for accuracy and completeness, and I agree with the above.  Brendolyn Patty MD

## 2020-04-13 NOTE — Patient Instructions (Addendum)
Doxycycline should be taken with food to prevent nausea. Do not lay down for 30 minutes after taking. Be cautious with sun exposure and use good sun protection while on this medication. Pregnant women should not take this medication.   Avoid dairy products one hour before and one hour after taking Doxycycline.

## 2020-04-19 ENCOUNTER — Other Ambulatory Visit: Payer: Self-pay

## 2020-04-19 ENCOUNTER — Ambulatory Visit
Admission: RE | Admit: 2020-04-19 | Discharge: 2020-04-19 | Disposition: A | Discharge status: Home / Self Care | Payer: Medicare Other | Source: Ambulatory Visit | Attending: Family Medicine | Admitting: Family Medicine

## 2020-04-19 DIAGNOSIS — R911 Solitary pulmonary nodule: Secondary | ICD-10-CM | POA: Insufficient documentation

## 2020-04-19 DIAGNOSIS — E041 Nontoxic single thyroid nodule: Secondary | ICD-10-CM | POA: Diagnosis present

## 2020-04-21 ENCOUNTER — Other Ambulatory Visit: Payer: Self-pay

## 2020-04-21 DIAGNOSIS — L089 Local infection of the skin and subcutaneous tissue, unspecified: Secondary | ICD-10-CM

## 2020-04-21 DIAGNOSIS — T148XXA Other injury of unspecified body region, initial encounter: Secondary | ICD-10-CM

## 2020-04-21 MED ORDER — MUPIROCIN 2 % EX OINT: 1.0000 "application " | TOPICAL_OINTMENT | Freq: Every day | CUTANEOUS | 2 refills | Status: AC

## 2020-04-21 NOTE — Telephone Encounter (Signed)
Patient's daughter in clinic today and notes she is nearly out of mupirocin. Refill sent, authorized by Dr. Laurence Ferrari.

## 2020-05-03 ENCOUNTER — Ambulatory Visit (INDEPENDENT_AMBULATORY_CARE_PROVIDER_SITE_OTHER): Payer: Medicare Other | Admitting: Dermatology

## 2020-05-03 ENCOUNTER — Other Ambulatory Visit: Payer: Self-pay

## 2020-05-03 ENCOUNTER — Encounter: Payer: Self-pay | Admitting: Dermatology

## 2020-05-03 DIAGNOSIS — T148XXA Other injury of unspecified body region, initial encounter: Secondary | ICD-10-CM

## 2020-05-03 DIAGNOSIS — L565 Disseminated superficial actinic porokeratosis (DSAP): Secondary | ICD-10-CM | POA: Diagnosis not present

## 2020-05-03 DIAGNOSIS — L570 Actinic keratosis: Secondary | ICD-10-CM | POA: Diagnosis not present

## 2020-05-03 DIAGNOSIS — L089 Local infection of the skin and subcutaneous tissue, unspecified: Secondary | ICD-10-CM

## 2020-05-03 DIAGNOSIS — C44131 Sebaceous cell carcinoma of skin of unspecified eyelid, including canthus: Secondary | ICD-10-CM | POA: Diagnosis not present

## 2020-05-03 DIAGNOSIS — Z85828 Personal history of other malignant neoplasm of skin: Secondary | ICD-10-CM

## 2020-05-03 DIAGNOSIS — L821 Other seborrheic keratosis: Secondary | ICD-10-CM | POA: Diagnosis not present

## 2020-05-03 NOTE — Progress Notes (Signed)
   Follow-Up Visit   Subjective  Allison Hampton is a 86 y.o. female who presents for the following: Follow-up. Patient is here for a follow-up on a wound infection of the right posterior lower leg. She is improved after taking doxycycline and using mupirocin ointment. She also had some precancers treated on the legs to recheck.   The following portions of the chart were reviewed this encounter and updated as appropriate:      Review of Systems:  No other skin or systemic complaints except as noted in HPI or Assessment and Plan.  Objective  Well appearing patient in no apparent distress; mood and affect are within normal limits.  A focused examination was performed including face, arms, legs. Relevant physical exam findings are noted in the Assessment and Plan.  Objective  Right Posterior Lower Leg: Dyspigmented patch with decreased erythema and central crusted flat papule.  Objective  Left Lower Leg x 1, R lateral hand dorsum x 1, L hand dorsum x 2, R wrist, R foot dorsum x 1 (6): Scaly keratotic papules.  Aks treated last visit have cleared  Objective  Right Eyelid: Clear.  Objective  arms, lower legs: Pink brown keratotic macules and patches   Assessment & Plan   Actinic Damage with DSAP - diffuse scaly erythematous macules with underlying dyspigmentation - Recommend daily broad spectrum sunscreen SPF 30+ to sun-exposed areas, reapply every 2 hours as needed.  - Call for new or changing lesions.  Seborrheic Keratoses - Stuck-on, waxy, tan-brown papules and plaques R wrist - Discussed benign etiology and prognosis. - Observe - Call for any changes  History of Squamous Cell Carcinoma of the Skin - No evidence of recurrence today- multiple sites - Recommend regular full body skin exams - Recommend daily broad spectrum sunscreen SPF 30+ to sun-exposed areas, reapply every 2 hours as needed.  - Call if any new or changing lesions are noted between office  visits  Wound infection Right Posterior Lower Leg  Improved. Continue Mupirocin 2% Ointment bid and cover x 1 more week.  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  She has finished topical chemotherapy treatment. Patient has another biopsy scheduled for Nov 14 at [facility] for recurrence. Call clinic with any new or changing lesions.  DSAP (disseminated superficial actinic porokeratosis) arms, lower legs  Benign-appearing.  Observation.  Call clinic for new or changing lesions.  Recommend daily use of broad spectrum spf 30+ sunscreen to sun-exposed areas.    Return in about 3 months (around 08/02/2020) for AKs, DSAP, h/o SCC/Seb.Carcinoma.   IJamesetta Orleans, CMA, am acting as scribe for Brendolyn Patty, MD .  Documentation: I have reviewed the above documentation for accuracy and completeness, and I agree with the above.  Brendolyn Patty MD

## 2020-05-03 NOTE — Patient Instructions (Signed)
Cryotherapy Aftercare  . Wash gently with soap and water everyday.   . Apply Vaseline and Band-Aid daily until healed.  

## 2020-08-09 ENCOUNTER — Ambulatory Visit (INDEPENDENT_AMBULATORY_CARE_PROVIDER_SITE_OTHER): Payer: Medicare Other | Admitting: Dermatology

## 2020-08-09 ENCOUNTER — Other Ambulatory Visit: Payer: Self-pay

## 2020-08-09 DIAGNOSIS — L570 Actinic keratosis: Secondary | ICD-10-CM

## 2020-08-09 DIAGNOSIS — L565 Disseminated superficial actinic porokeratosis (DSAP): Secondary | ICD-10-CM

## 2020-08-09 DIAGNOSIS — L578 Other skin changes due to chronic exposure to nonionizing radiation: Secondary | ICD-10-CM | POA: Diagnosis not present

## 2020-08-09 DIAGNOSIS — C44131 Sebaceous cell carcinoma of skin of unspecified eyelid, including canthus: Secondary | ICD-10-CM | POA: Diagnosis not present

## 2020-08-09 DIAGNOSIS — L821 Other seborrheic keratosis: Secondary | ICD-10-CM | POA: Diagnosis not present

## 2020-08-09 DIAGNOSIS — Z85828 Personal history of other malignant neoplasm of skin: Secondary | ICD-10-CM

## 2020-08-09 NOTE — Progress Notes (Signed)
   Follow-Up Visit   Subjective  Allison Hampton is a 86 y.o. female who presents for the following: Follow-up (follow up of AK x 6, SCC on right eyelid, and rough area on left arm). She has a h/o DSAP.    The following portions of the chart were reviewed this encounter and updated as appropriate:      Review of Systems:  No other skin or systemic complaints except as noted in HPI or Assessment and Plan.  Objective  Well appearing patient in no apparent distress; mood and affect are within normal limits.  A focused examination was performed including face, arms, and legs . Relevant physical exam findings are noted in the Assessment and Plan.  Objective  Right Eye: Well healed excision site , appears clear  Objective  Bilateral legs, arms: Pink/tan keratotic macules and patches, some with keratotic rim  Objective  left wrist x 3, left medial knee x 1, right knee x 1, right foot dorsum x 2, left medial pretibia x 1 (8): Pink keratotic papules and patches.    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 Observe for recurrence. Call clinic for new or changing lesions.  Recommend regular skin exams, daily broad-spectrum spf 30+ sunscreen use, and photoprotection.   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 Call for new or changing lesions.   Actinic keratosis (8) left wrist x 3, left medial knee x 1, right knee x 1, right foot dorsum x 2, left medial pretibia x 1  Hypertrophic   Recheck L medial knee on f/up  Destruction of lesion - left wrist x 3, left medial knee x 1, right knee x 1, right foot dorsum x 2, left medial pretibia x 1  Destruction method: cryotherapy     Informed consent: discussed and consent obtained   Lesion destroyed using liquid nitrogen: Yes   Region frozen until ice ball extended beyond lesion: Yes   Outcome: patient tolerated procedure well with no complications   Post-procedure details: wound care instructions given     Seborrheic Keratoses - Stuck-on, waxy, tan-brown papules and plaques  - Discussed benign etiology and prognosis. - Observe - Call for any changes  Actinic Damage - chronic, secondary to cumulative UV radiation exposure/sun exposure over time - diffuse scaly erythematous macules with underlying dyspigmentation - Recommend daily broad spectrum sunscreen SPF 30+ to sun-exposed areas, reapply every 2 hours as needed.  - Call for new or changing lesions.  History of Squamous Cell Carcinoma of the Skin - No evidence of recurrence today - Recommend regular full body skin exams - Recommend daily broad spectrum sunscreen SPF 30+ to sun-exposed areas, reapply every 2 hours as needed.  - Call if any new or changing lesions are noted between office visits  Return in about 3 months (around 11/07/2020) for AK follow up, recheck L medial knee.   I, Ruthell Rummage, CMA, am acting as scribe for Brendolyn Patty, MD.  Documentation: I have reviewed the above documentation for accuracy and completeness, and I agree with the above.  Brendolyn Patty MD

## 2020-11-22 ENCOUNTER — Other Ambulatory Visit: Payer: Self-pay

## 2020-11-22 ENCOUNTER — Ambulatory Visit (INDEPENDENT_AMBULATORY_CARE_PROVIDER_SITE_OTHER): Payer: Medicare Other | Admitting: Dermatology

## 2020-11-22 DIAGNOSIS — L565 Disseminated superficial actinic porokeratosis (DSAP): Secondary | ICD-10-CM

## 2020-11-22 DIAGNOSIS — L821 Other seborrheic keratosis: Secondary | ICD-10-CM

## 2020-11-22 DIAGNOSIS — I872 Venous insufficiency (chronic) (peripheral): Secondary | ICD-10-CM | POA: Diagnosis not present

## 2020-11-22 DIAGNOSIS — Z85828 Personal history of other malignant neoplasm of skin: Secondary | ICD-10-CM | POA: Diagnosis not present

## 2020-11-22 DIAGNOSIS — L570 Actinic keratosis: Secondary | ICD-10-CM | POA: Diagnosis not present

## 2020-11-22 DIAGNOSIS — Z872 Personal history of diseases of the skin and subcutaneous tissue: Secondary | ICD-10-CM

## 2020-11-22 NOTE — Progress Notes (Signed)
Follow-Up Visit   Subjective  Allison Hampton is a 87 y.o. female who presents for the following: 3 follow up (Patient states she would like to have checked  left mid shin and right ankle. Patient states the area above right ankle stings.  Patient is here today for follow up ak on left medial knee. ).  She has h/o DSAP and Sebaceous carcinoma R eyelid   The following portions of the chart were reviewed this encounter and updated as appropriate:      Objective  Well appearing patient in no apparent distress; mood and affect are within normal limits.  A focused examination was performed including bilateral legs and face . Relevant physical exam findings are noted in the Assessment and Plan.  Objective  right lateral calf x 4, right ankle x 2, right inferior knee x 2, left ankle x 6, left pretibia x 4, (18): Keratotic papules   Objective  bilateral legs: Stasis changes with edema   Objective  bilateral legs and arms: Pink/tan keratotic macules and patches, some with keratotic rim  Assessment & Plan  Actinic keratosis (18) right lateral calf x 4, right ankle x 2, right inferior knee x 2, left ankle x 6, left pretibia x 4,  Hypertrophic Ak vs Isk   Prior to procedure, discussed risks of blister formation, small wound, skin dyspigmentation, or rare scar following cryotherapy.    Destruction of lesion - right lateral calf x 4, right ankle x 2, right inferior knee x 2, left ankle x 6, left pretibia x 4,  Destruction method: cryotherapy   Informed consent: discussed and consent obtained   Lesion destroyed using liquid nitrogen: Yes   Region frozen until ice ball extended beyond lesion: Yes   Outcome: patient tolerated procedure well with no complications   Post-procedure details: wound care instructions given    Stasis dermatitis of both legs bilateral legs  Stasis in the legs causes chronic leg swelling, which may result in itchy or painful rashes, skin discoloration,  skin texture changes, and sometimes ulceration.  Recommend daily compression hose/stockings- easiest to put on first thing in morning, remove at bedtime.  Elevate legs as much as possible. Avoid salt/sodium rich foods.   Disseminated superficial actinic porokeratosis bilateral legs and arms    Chronic inherited condition, exacerbated by sun exposure Recommend daily broad spectrum sunscreen SPF 30+ to sun-exposed areas, reapply every 2 hours as needed. Call for new or changing lesions  Seborrheic Keratoses - Stuck-on, waxy, tan-brown papules and plaques right infraoccular  - Discussed benign etiology and prognosis. - Observe - Call for any changes  History of Sebaceous Cell Carcinoma  - No evidence of recurrence today right eyelid including canthus - Recommend regular full body skin exams - Recommend daily broad spectrum sunscreen SPF 30+ to sun-exposed areas, reapply every 2 hours as needed.  - Call if any new or changing lesions are noted between office visits  History of PreCancerous Actinic Keratosis  - site(s) of PreCancerous Actinic Keratosis clear today on left medial knee. Clear today - these may recur and new lesions may form requiring treatment to prevent transformation into skin cancer - observe for new or changing spots and contact [place] for appointment if occur - photoprotection with sun protective clothing; sunglasses and broad spectrum sunscreen with SPF of at least 30 + and frequent self skin exams recommended - yearly exams by a dermatologist recommended for persons with history of PreCancerous Actinic Keratoses  Return in about  3 months (around 02/22/2021) for ak followup.  I, Ruthell Rummage, CMA, am acting as scribe for Brendolyn Patty, MD.  Documentation: I have reviewed the above documentation for accuracy and completeness, and I agree with the above.  Brendolyn Patty MD

## 2020-11-22 NOTE — Patient Instructions (Addendum)
Cryotherapy Aftercare  . Wash gently with soap and water everyday.   Apply Vaseline and Band-Aid daily until healed.  Recommend Gold Bond Rough and Bumpy lotion to affected areas on legs.   If you have any questions or concerns for your doctor, please call our main line at [phone] and press option 4 to reach your doctor's medical assistant. If no one answers, please leave a voicemail as directed and we will return your call as soon as possible. Messages left after 4 pm will be answered the following business day.   You may also send [place] a message via [place]. We typically respond to MyChart messages within 1-2 business days.  For prescription refills, please ask your pharmacy to contact our office. Our fax number is [phone].  If you have an urgent issue when the clinic is closed that cannot wait until the next business day, you can page your doctor at the number below.    Please note that while we do our best to be available for urgent issues outside of office hours, we are not available 24/7.   If you have an urgent issue and are unable to reach [place], you may choose to seek medical care at your doctor's office, retail clinic, urgent care center, or emergency room.  If you have a medical emergency, please immediately call 911 or go to the emergency department.  Pager Numbers  - Dr. Nehemiah Massed: [phone]  - Dr. Laurence Ferrari: [phone]  - Dr. Nicole Kindred: [phone]  In the event of inclement weather, please call our main line at [phone] for an update on the status of any delays or closures.  Dermatology Medication Tips: Please keep the boxes that topical medications come in in order to help keep track of the instructions about where and how to use these. Pharmacies typically print the medication instructions only on the boxes and not directly on the medication tubes.   If your medication is too expensive, please contact our office at [phone] option 4 or send [place] a message  through [place].   We are unable to tell what your co-pay for medications will be in advance as this is different depending on your insurance coverage. However, we may be able to find a substitute medication at lower cost or fill out paperwork to get insurance to cover a needed medication.   If a prior authorization is required to get your medication covered by your insurance company, please allow [place] 1-2 business days to complete this process.  Drug prices often vary depending on where the prescription is filled and some pharmacies may offer cheaper prices.  The website www.goodrx.com contains coupons for medications through different pharmacies. The prices here do not account for what the cost may be with help from insurance (it may be cheaper with your insurance), but the website can give you the price if you did not use any insurance.  - You can print the associated coupon and take it with your prescription to the pharmacy.  - You may also stop by our office during regular business hours and pick up a GoodRx coupon card.  - If you need your prescription sent electronically to a different pharmacy, notify our office through [facility] or by phone at [phone] option 4.

## 2020-12-01 ENCOUNTER — Ambulatory Visit (INDEPENDENT_AMBULATORY_CARE_PROVIDER_SITE_OTHER): Payer: Medicare Other | Admitting: Dermatology

## 2020-12-01 ENCOUNTER — Encounter: Payer: Self-pay | Admitting: Dermatology

## 2020-12-01 ENCOUNTER — Other Ambulatory Visit: Payer: Self-pay

## 2020-12-01 DIAGNOSIS — R234 Changes in skin texture: Secondary | ICD-10-CM

## 2020-12-01 DIAGNOSIS — L98499 Non-pressure chronic ulcer of skin of other sites with unspecified severity: Secondary | ICD-10-CM

## 2020-12-01 DIAGNOSIS — L539 Erythematous condition, unspecified: Secondary | ICD-10-CM

## 2020-12-01 DIAGNOSIS — L570 Actinic keratosis: Secondary | ICD-10-CM

## 2020-12-01 DIAGNOSIS — D692 Other nonthrombocytopenic purpura: Secondary | ICD-10-CM

## 2020-12-01 DIAGNOSIS — R600 Localized edema: Secondary | ICD-10-CM | POA: Diagnosis not present

## 2020-12-01 MED ORDER — DOXYCYCLINE MONOHYDRATE 100 MG PO CAPS: 100.0000 mg | ORAL_CAPSULE | Freq: Two times a day (BID) | ORAL | 0 refills | Status: DC

## 2020-12-01 MED ORDER — MUPIROCIN 2 % EX OINT: TOPICAL_OINTMENT | CUTANEOUS | 0 refills | Status: DC

## 2020-12-01 NOTE — Progress Notes (Signed)
   Follow-Up Visit   Subjective  Allison Hampton is a 87 y.o. female who presents for the following: Wound Check (Pt had AKs treated with Ln2 by Dr. Nicole Kindred on 11/22/20. Pt is unsure if areas are healing properly and would like them checked today.).  The following portions of the chart were reviewed this encounter and updated as appropriate:  Tobacco  Allergies  Meds  Problems  Med Hx  Surg Hx  Fam Hx     Review of Systems: No other skin or systemic complaints except as noted in HPI or Assessment and Plan.  Objective  Well appearing patient in no apparent distress; mood and affect are within normal limits.  A focused examination was performed including legs. Relevant physical exam findings are noted in the Assessment and Plan.  Objective  Right Lower Leg - Anterior: 1 week post Ln2 treatment in office Erythema, crusting, ulcers, and purpura  Images        Assessment & Plan  AK (actinic keratosis) -recently treated with LN2 by Dr. Nicole Kindred but now patient is concerned of poorly healing areas and redness. Today she exhibits purpura; erythema; edema; and ulcers and crusts Reassured patient that this appears normal due to the fact that her skin is very thin and fragile in the lower legs heal very slowly. Nevertheless due to risk of infection in these open areas, and the possibility she may be exhibiting some early cellulitis, recommend topical mupirocin and oral doxycycline Right Lower Leg - Anterior Start Doxycycline 100 mg. Take BID x 1 week. Take with food. Start mupirocin. Apply to sores 1-3 times per day.   doxycycline (MONODOX) 100 MG capsule - Right Lower Leg - Anterior mupirocin ointment (BACTROBAN) 2 % - Right Lower Leg - Anterior  Return for as scheduled with Dr. Nicole Kindred.   IHarriett Sine, CMA, am acting as scribe for Sarina Ser, MD.  Documentation: I have reviewed the above documentation for accuracy and completeness, and I agree with the  above.  Sarina Ser, MD

## 2021-02-27 ENCOUNTER — Other Ambulatory Visit: Payer: Self-pay

## 2021-02-27 ENCOUNTER — Ambulatory Visit (INDEPENDENT_AMBULATORY_CARE_PROVIDER_SITE_OTHER): Payer: Medicare Other | Admitting: Dermatology

## 2021-02-27 DIAGNOSIS — L820 Inflamed seborrheic keratosis: Secondary | ICD-10-CM

## 2021-02-27 DIAGNOSIS — L565 Disseminated superficial actinic porokeratosis (DSAP): Secondary | ICD-10-CM | POA: Diagnosis not present

## 2021-02-27 DIAGNOSIS — L570 Actinic keratosis: Secondary | ICD-10-CM | POA: Diagnosis not present

## 2021-02-27 DIAGNOSIS — Z85828 Personal history of other malignant neoplasm of skin: Secondary | ICD-10-CM

## 2021-02-27 DIAGNOSIS — L578 Other skin changes due to chronic exposure to nonionizing radiation: Secondary | ICD-10-CM

## 2021-02-27 NOTE — Patient Instructions (Addendum)
Instructions for [facility] Medications  One or more of your medications was sent to the [facility] mail order compounding pharmacy. You will receive a phone call from them and can purchase the medicine through that link. It will then be mailed to your home at the address you confirmed. If for any reason you do not receive a phone call from them, please let us know. [facility] phone number is [phone].   Start [facility] cholesterol ointment to legs twice daily.  Recommend OTC Gold Bond Rapid Relief Anti-Itch cream (pramoxine + menthol) up to 3 times per day to areas that are itchy.  Recommend daily broad spectrum sunscreen SPF 30+ to sun-exposed areas, reapply every 2 hours as needed. Call for new or changing lesions.  Staying in the shade or wearing long sleeves, sun glasses (UVA+UVB protection) and wide brim hats (4-inch brim around the entire circumference of the hat) are also recommended for sun protection.   If you have any questions or concerns for your doctor, please call our main line at [phone] and press option 4 to reach your doctor's medical assistant. If no one answers, please leave a voicemail as directed and we will return your call as soon as possible. Messages left after 4 pm will be answered the following business day.   You may also send [place] a message via [place]. We typically respond to MyChart messages within 1-2 business days.  For prescription refills, please ask your pharmacy to contact our office. Our fax number is [phone].  If you have an urgent issue when the clinic is closed that cannot wait until the next business day, you can page your doctor at the number below.    Please note that while we do our best to be available for urgent issues outside of office hours, we are not available 24/7.   If you have an urgent issue and are unable to reach [place], you may choose to seek medical care at your doctor's office, retail clinic, urgent care  center, or emergency room.  If you have a medical emergency, please immediately call 911 or go to the emergency department.  Pager Numbers  - Dr. Nehemiah Massed: [phone]  - Dr. Laurence Ferrari: [phone]  - Dr. Nicole Kindred: [phone]  In the event of inclement weather, please call our main line at [phone] for an update on the status of any delays or closures.  Dermatology Medication Tips: Please keep the boxes that topical medications come in in order to help keep track of the instructions about where and how to use these. Pharmacies typically print the medication instructions only on the boxes and not directly on the medication tubes.   If your medication is too expensive, please contact our office at [phone] option 4 or send [place] a message through [place].   We are unable to tell what your co-pay for medications will be in advance as this is different depending on your insurance coverage. However, we may be able to find a substitute medication at lower cost or fill out paperwork to get insurance to cover a needed medication.   If a prior authorization is required to get your medication covered by your insurance company, please allow [place] 1-2 business days to complete this process.  Drug prices often vary depending on where the prescription is filled and some pharmacies may offer cheaper prices.  The website www.goodrx.com contains coupons for medications through different pharmacies. The prices here do not account for what the cost may be with  help from insurance (it may be cheaper with your insurance), but the website can give you the price if you did not use any insurance.  - You can print the associated coupon and take it with your prescription to the pharmacy.  - You may also stop by our office during regular business hours and pick up a GoodRx coupon card.  - If you need your prescription sent electronically to a different pharmacy, notify our office through [facility] or by  phone at [phone] option 4.

## 2021-02-27 NOTE — Progress Notes (Signed)
Follow-Up Visit   Subjective  Allison Hampton is a 87 y.o. female who presents for the following: Follow-up (Patient here today for 3 month AK follow up at the legs. Patient has hx of SCC. ). And h/o sebaceous cell carcinoma of the eyelid.  She also has an irritated spot by her nose.    The following portions of the chart were reviewed this encounter and updated as appropriate:        Review of Systems:  No other skin or systemic complaints except as noted in HPI or Assessment and Plan.  Objective  Well appearing patient in no apparent distress; mood and affect are within normal limits.  A focused examination was performed including legs, face, arms. Relevant physical exam findings are noted in the Assessment and Plan.  Right Posterior Ankle x 1, left lat calf x 2, left pretibia x 1, right lat thigh x 1, right post thigh x 1, right medial ankle x 1 (7) Erythematous papules/macules with gritty scale.   Lower Legs, arms Multiple erythematous scaly macules/patches with keratotic rim  left paranasal Erythematous keratotic or waxy stuck-on papule    Assessment & Plan  Actinic keratosis (7) Right Posterior Ankle x 1, left lat calf x 2, left pretibia x 1, right lat thigh x 1, right post thigh x 1, right medial ankle x 1  Actinic keratoses are precancerous spots that appear secondary to cumulative UV radiation exposure/sun exposure over time. They are chronic with expected duration over 1 year. A portion of actinic keratoses will progress to squamous cell carcinoma of the skin. It is not possible to reliably predict which spots will progress to skin cancer and so treatment is recommended to prevent development of skin cancer.  Recommend daily broad spectrum sunscreen SPF 30+ to sun-exposed areas, reapply every 2 hours as needed.  Recommend staying in the shade or wearing long sleeves, sun glasses (UVA+UVB protection) and wide brim hats (4-inch brim around the entire circumference  of the hat). Call for new or changing lesions.   Destruction of lesion - Right Posterior Ankle x 1, left lat calf x 2, left pretibia x 1, right lat thigh x 1, right post thigh x 1, right medial ankle x 1  Destruction method: cryotherapy   Informed consent: discussed and consent obtained   Lesion destroyed using liquid nitrogen: Yes   Region frozen until ice ball extended beyond lesion: Yes   Outcome: patient tolerated procedure well with no complications   Post-procedure details: wound care instructions given   Additional details:  Prior to procedure, discussed risks of blister formation, small wound, skin dyspigmentation, or rare scar following cryotherapy. Recommend Vaseline ointment to treated areas while healing.   Disseminated superficial actinic porokeratosis (DSAP) Lower Legs, arms  Chronic genetic condition, exacerbated by sun exposure Start [facility] Cholesterol: 2%, Lovastatin: 2% twice daily to legs.   Pt has had myalgias with oral statin in past.  She will closely monitor for any similar reaction with topical use.  Do not recommend she treat arms and legs at same time.  Start with legs  Recommend OTC Gold Bond Rough and Bumpy cream to arms and legs  Recommend daily broad spectrum sunscreen SPF 30+ to sun-exposed areas, reapply every 2 hours as needed. Call for new or changing lesions.  Staying in the shade or wearing long sleeves, sun glasses (UVA+UVB protection) and wide brim hats (4-inch brim around the entire circumference of the hat) are also recommended for sun protection.  Inflamed seborrheic keratosis left paranasal  Destruction of lesion - left paranasal  Destruction method: cryotherapy   Informed consent: discussed and consent obtained   Lesion destroyed using liquid nitrogen: Yes   Region frozen until ice ball extended beyond lesion: Yes   Outcome: patient tolerated procedure well with no complications   Post-procedure details: wound care  instructions given   Additional details:  Prior to procedure, discussed risks of blister formation, small wound, skin dyspigmentation, or rare scar following cryotherapy. Recommend Vaseline ointment to treated areas while healing.   Actinic Damage - chronic, secondary to cumulative UV radiation exposure/sun exposure over time - diffuse scaly erythematous macules with underlying dyspigmentation - Recommend daily broad spectrum sunscreen SPF 30+ to sun-exposed areas, reapply every 2 hours as needed.  - Recommend staying in the shade or wearing long sleeves, sun glasses (UVA+UVB protection) and wide brim hats (4-inch brim around the entire circumference of the hat). - Call for new or changing lesions.   History of Squamous Cell Carcinoma of the Skin (multiple see history) - No evidence of recurrence today - Recommend regular full body skin exams - Recommend daily broad spectrum sunscreen SPF 30+ to sun-exposed areas, reapply every 2 hours as needed.  - Call if any new or changing lesions are noted between office visits  History of Sebaceous cell carcinoma R eyelid  Clear. Observe for recurrence.  Call clinic for new or changing lesions.   Recommend regular skin exams, daily broad-spectrum spf 30+ sunscreen use, and photoprotection.       Return in about 3 months (around 05/30/2021) for AK follow up, DSAP f/up.  Graciella Belton, RMA, am acting as scribe for Brendolyn Patty, MD .  Documentation: I have reviewed the above documentation for accuracy and completeness, and I agree with the above.  Brendolyn Patty MD

## 2021-05-22 ENCOUNTER — Ambulatory Visit: Payer: Medicare Other | Admitting: Dermatology

## 2021-06-07 ENCOUNTER — Ambulatory Visit (INDEPENDENT_AMBULATORY_CARE_PROVIDER_SITE_OTHER): Payer: Medicare Other | Admitting: Dermatology

## 2021-06-07 ENCOUNTER — Other Ambulatory Visit: Payer: Self-pay

## 2021-06-07 ENCOUNTER — Encounter: Payer: Self-pay | Admitting: Dermatology

## 2021-06-07 DIAGNOSIS — L578 Other skin changes due to chronic exposure to nonionizing radiation: Secondary | ICD-10-CM | POA: Diagnosis not present

## 2021-06-07 DIAGNOSIS — L565 Disseminated superficial actinic porokeratosis (DSAP): Secondary | ICD-10-CM

## 2021-06-07 DIAGNOSIS — L820 Inflamed seborrheic keratosis: Secondary | ICD-10-CM | POA: Diagnosis not present

## 2021-06-07 DIAGNOSIS — Z85828 Personal history of other malignant neoplasm of skin: Secondary | ICD-10-CM

## 2021-06-07 NOTE — Progress Notes (Signed)
Follow-Up Visit   Subjective  Allison Hampton is a 87 y.o. female who presents for the following: Follow-up (3 month recheck DSAP on arms and legs. Has been using [facility] Cholesterol: 2%, Lovastatin: 2% twice daily to legs and arms. Is alternating treatment between arms and legs.  Patient reports improvement. ).  She has a few spots on her face that have changed and get irritated.  She has a h/o SCC and Sebaceous cell carcinoma    The following portions of the chart were reviewed this encounter and updated as appropriate:        Objective   Review of Systems: No other skin or systemic complaints except as noted in HPI or Assessment and Plan.  Well appearing patient in no apparent distress; mood and affect are within normal limits.  A focused examination was performed including face, arms, legs. Relevant physical exam findings are noted in the Assessment and Plan.  Right Lower Leg - Anterior Pink-brown scaly macules and patches with hyper keratosis, keratotic rim; with decreased erythema and scale.  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 Cream  Chronic inherited condition of sun-exposed skin, most commonly arms and legs.  Difficult to treat.  Recommend photoprotection and regular use of spf 30 or higher sunscreen to prevent worsening of condition and precancerous changes.  Continue Cholesterol 2% Lovastatin 2% Cream 240 gm- Apply twice daily as directed to affected areas arms and legs.  Sent to [facility]. RF per patient request.   Inflamed seborrheic keratosis Left Preauricular Area x1, right cheek x2, left cheek x2  Destruction of lesion - Left Preauricular Area x1, right cheek x2, left cheek x2  Destruction method: cryotherapy   Informed consent:  discussed and consent obtained   Lesion destroyed using liquid nitrogen: Yes   Region frozen until ice ball extended beyond lesion: Yes   Outcome: patient tolerated procedure well with no complications   Post-procedure details: wound care instructions given   Additional details:  Prior to procedure, discussed risks of blister formation, small wound, skin dyspigmentation, or rare scar following cryotherapy. Recommend Vaseline ointment to treated areas while healing.   History of Sebaceous cell carcinoma R eyelid   Clear. Observe for recurrence.  Call clinic for new or changing lesions.   Recommend regular skin exams, daily broad-spectrum spf 30+ sunscreen use, and photoprotection.     Actinic Damage - chronic, secondary to cumulative UV radiation exposure/sun exposure over time - diffuse scaly erythematous macules with underlying dyspigmentation - Recommend daily broad spectrum sunscreen SPF 30+ to sun-exposed areas, reapply every 2 hours as needed.  - Recommend staying in the shade or wearing long sleeves, sun glasses (UVA+UVB protection) and wide brim hats (4-inch brim around the entire circumference of the hat). - Call for new or changing lesions.   History of Squamous Cell Carcinoma of the Skin - No evidence of recurrence today at left hand dorsum.  - Recommend regular full body skin exams - Recommend daily broad spectrum sunscreen SPF 30+ to sun-exposed areas, reapply every 2 hours as needed.  - Call if any new or changing lesions are noted between office visits     Return in about 6 months (around 12/06/2021) for DSAP recheck.  I, Emelia Salisbury, CMA, am acting as scribe for Brendolyn Patty, MD.  Documentation: I  have reviewed the above documentation for accuracy and completeness, and I agree with the above.  Brendolyn Patty MD

## 2021-06-07 NOTE — Patient Instructions (Addendum)
Continue Cholesterol 2% Lovastatin 2% Cream 240 gm- Apply once or twice daily as directed to affected areas arms and legs.  Cryotherapy Aftercare  Wash gently with soap and water everyday.   Apply Vaseline and Band-Aid daily until healed.   Prior to procedure, discussed risks of blister formation, small wound, skin dyspigmentation, or rare scar following cryotherapy. Recommend Vaseline ointment to treated areas while healing.   If you have any questions or concerns for your doctor, please call our main line at [phone] and press option 4 to reach your doctor's medical assistant. If no one answers, please leave a voicemail as directed and we will return your call as soon as possible. Messages left after 4 pm will be answered the following business day.   You may also send [place] a message via [place]. We typically respond to MyChart messages within 1-2 business days.  For prescription refills, please ask your pharmacy to contact our office. Our fax number is [phone].  If you have an urgent issue when the clinic is closed that cannot wait until the next business day, you can page your doctor at the number below.    Please note that while we do our best to be available for urgent issues outside of office hours, we are not available 24/7.   If you have an urgent issue and are unable to reach [place], you may choose to seek medical care at your doctor's office, retail clinic, urgent care center, or emergency room.  If you have a medical emergency, please immediately call 911 or go to the emergency department.  Pager Numbers  - Dr. Nehemiah Massed: [phone]  - Dr. Laurence Ferrari: [phone]  - Dr. Nicole Kindred: [phone]  In the event of inclement weather, please call our main line at [phone] for an update on the status of any delays or closures.  Dermatology Medication Tips: Please keep the boxes that topical medications come in in order to help keep track of the instructions about where and how  to use these. Pharmacies typically print the medication instructions only on the boxes and not directly on the medication tubes.   If your medication is too expensive, please contact our office at [phone] option 4 or send [place] a message through [place].   We are unable to tell what your co-pay for medications will be in advance as this is different depending on your insurance coverage. However, we may be able to find a substitute medication at lower cost or fill out paperwork to get insurance to cover a needed medication.   If a prior authorization is required to get your medication covered by your insurance company, please allow [place] 1-2 business days to complete this process.  Drug prices often vary depending on where the prescription is filled and some pharmacies may offer cheaper prices.  The website www.goodrx.com contains coupons for medications through different pharmacies. The prices here do not account for what the cost may be with help from insurance (it may be cheaper with your insurance), but the website can give you the price if you did not use any insurance.  - You can print the associated coupon and take it with your prescription to the pharmacy.  - You may also stop by our office during regular business hours and pick up a GoodRx coupon card.  - If you need your prescription sent electronically to a different pharmacy, notify our office through [facility] or by phone at [phone] option 4.

## 2021-10-02 ENCOUNTER — Other Ambulatory Visit: Payer: Self-pay

## 2021-10-02 ENCOUNTER — Ambulatory Visit (INDEPENDENT_AMBULATORY_CARE_PROVIDER_SITE_OTHER): Payer: Medicare Other | Admitting: Dermatology

## 2021-10-02 ENCOUNTER — Encounter: Payer: Self-pay | Admitting: Dermatology

## 2021-10-02 DIAGNOSIS — L578 Other skin changes due to chronic exposure to nonionizing radiation: Secondary | ICD-10-CM

## 2021-10-02 DIAGNOSIS — D692 Other nonthrombocytopenic purpura: Secondary | ICD-10-CM

## 2021-10-02 DIAGNOSIS — D485 Neoplasm of uncertain behavior of skin: Secondary | ICD-10-CM

## 2021-10-02 DIAGNOSIS — C44729 Squamous cell carcinoma of skin of left lower limb, including hip: Secondary | ICD-10-CM

## 2021-10-02 DIAGNOSIS — L570 Actinic keratosis: Secondary | ICD-10-CM

## 2021-10-02 NOTE — Progress Notes (Signed)
Follow-Up Visit   Subjective  Allison Hampton is a 88 y.o. female who presents for the following: Skin Problem (Patient here today for a sore spot at her left lower leg, present for a few months. ).  She has a h/o multiple SCCs and sebaceous carcinoma.  Patient was unable to come earlier for visit due to respiratory infection.   The following portions of the chart were reviewed this encounter and updated as appropriate:   Tobacco   Allergies   Meds   Problems   Med Hx   Surg Hx   Fam Hx       Review of Systems:  No other skin or systemic complaints except as noted in HPI or Assessment and Plan.  Objective  Well appearing patient in no apparent distress; mood and affect are within normal limits.  A focused examination was performed including face, legs, feet. Relevant physical exam findings are noted in the Assessment and Plan.  left mid pretibia 1.2 cm pink nodule       Right Foot Dorsum x 1, right cheek x 3, right neck x 1, left zygoma x 1 (6) Erythematous thin papules/macules with gritty scale.     Assessment & Plan  Neoplasm of uncertain behavior of skin left mid pretibia  Epidermal / dermal shaving  Lesion diameter (cm):  1.2 Informed consent: discussed and consent obtained   Patient was prepped and draped in usual sterile fashion: Area prepped with alcohol. Anesthesia: the lesion was anesthetized in a standard fashion   Anesthetic:  1% lidocaine w/ epinephrine 1-100,000 buffered w/ 8.4% NaHCO3 Instrument used: flexible razor blade   Hemostasis achieved with: pressure, aluminum chloride and electrodesiccation   Outcome: patient tolerated procedure well    Destruction of lesion  Destruction method: electrodesiccation and curettage   Informed consent: discussed and consent obtained   Timeout:  patient name, date of birth, surgical site, and procedure verified Curettage performed in three different directions: Yes   Electrodesiccation performed over the  curetted area: Yes   Final wound size (cm):  1.2 Hemostasis achieved with:  pressure, aluminum chloride and electrodesiccation Outcome: patient tolerated procedure well with no complications   Post-procedure details: wound care instructions given   Additional details:  Mupirocin ointment and Bandaid applied    Specimen 1 - Surgical pathology Differential Diagnosis: r/o SCC  Check Margins: No 1.2 cm pink nodule Treated with EDC at time of biopsy  AK (actinic keratosis) (6) Right Foot Dorsum x 1, right cheek x 3, right neck x 1, left zygoma x 1  Vs porokeratosis at right foot dorsum Vs ISK at right cheek/R neck  Destruction of lesion - Right Foot Dorsum x 1, right cheek x 3, right neck x 1, left zygoma x 1  Destruction method: cryotherapy   Informed consent: discussed and consent obtained   Lesion destroyed using liquid nitrogen: Yes   Region frozen until ice ball extended beyond lesion: Yes   Outcome: patient tolerated procedure well with no complications   Post-procedure details: wound care instructions given   Additional details:  Prior to procedure, discussed risks of blister formation, small wound, skin dyspigmentation, or rare scar following cryotherapy. Recommend Vaseline ointment to treated areas while healing.   Related Medications mupirocin ointment (BACTROBAN) 2 % Apply 1-3 times per day to sores on legs.  Purpura - Chronic; persistent and recurrent.  Treatable, but not curable. - Violaceous macules and patches - Benign - Related to trauma, age, sun damage and/or use  of blood thinners, chronic use of topical and/or oral steroids - Observe - Can use OTC arnica containing moisturizer such as Dermend Bruise Formula if desired - Call for worsening or other concerns   Actinic Damage - chronic, secondary to cumulative UV radiation exposure/sun exposure over time - diffuse scaly erythematous macules with underlying dyspigmentation - Recommend daily broad spectrum  sunscreen SPF 30+ to sun-exposed areas, reapply every 2 hours as needed.  - Recommend staying in the shade or wearing long sleeves, sun glasses (UVA+UVB protection) and wide brim hats (4-inch brim around the entire circumference of the hat). - Call for new or changing lesions.   Return for as scheduled skin check.  Graciella Belton, RMA, am acting as scribe for Brendolyn Patty, MD .  Documentation: I have reviewed the above documentation for accuracy and completeness, and I agree with the above.  Brendolyn Patty MD

## 2021-10-02 NOTE — Patient Instructions (Signed)

## 2021-10-03 ENCOUNTER — Telehealth: Payer: Self-pay

## 2021-10-03 NOTE — Telephone Encounter (Signed)
Advised pt's daughter of bx results./sh

## 2021-10-03 NOTE — Telephone Encounter (Signed)
-----   Message from Brendolyn Patty, MD sent at 10/03/2021  3:39 PM EST ----- Skin , left mid pretibia WELL DIFFERENTIATED SQUAMOUS CELL CARCINOMA, BASE INVOLVED  SCC skin cancer- already treated with EDC at time of biopsy    - please call patient

## 2021-12-05 ENCOUNTER — Ambulatory Visit (INDEPENDENT_AMBULATORY_CARE_PROVIDER_SITE_OTHER): Payer: Medicare Other | Admitting: Dermatology

## 2021-12-05 DIAGNOSIS — L578 Other skin changes due to chronic exposure to nonionizing radiation: Secondary | ICD-10-CM | POA: Diagnosis not present

## 2021-12-05 DIAGNOSIS — L565 Disseminated superficial actinic porokeratosis (DSAP): Secondary | ICD-10-CM | POA: Diagnosis not present

## 2021-12-05 DIAGNOSIS — Z85828 Personal history of other malignant neoplasm of skin: Secondary | ICD-10-CM

## 2021-12-05 DIAGNOSIS — L821 Other seborrheic keratosis: Secondary | ICD-10-CM | POA: Diagnosis not present

## 2021-12-05 DIAGNOSIS — L570 Actinic keratosis: Secondary | ICD-10-CM | POA: Diagnosis not present

## 2021-12-05 NOTE — Patient Instructions (Addendum)
Continue with moisturizer with exfoliant (Urea, Salicylic acid, or Lactic acid) one to two times daily to help smooth rough and bumpy skin.  OTC options include Cetaphil Rough and Bumpy lotion (Urea), Eucerin Roughness Relief lotion or spot treatment cream (Urea), CeraVe SA lotion/cream for Rough and Bumpy skin (Sal Acid), Gold Bond Rough and Bumpy cream (Sal Acid), and AmLactin 12% lotion/cream (Lactic Acid).  If applying in morning, also apply sunscreen to sun-exposed areas, since these exfoliating moisturizers can increase sensitivity to sun. ? ? ?Continue Cholesterol 2% Lovastatin 2% Cream 240 gm- Apply twice daily as directed to affected areas arms and legs (make sure to apply at back of legs).   ? ? ?Actinic keratoses are precancerous spots that appear secondary to cumulative UV radiation exposure/sun exposure over time. They are chronic with expected duration over 1 year. A portion of actinic keratoses will progress to squamous cell carcinoma of the skin. It is not possible to reliably predict which spots will progress to skin cancer and so treatment is recommended to prevent development of skin cancer. ? ?Recommend daily broad spectrum sunscreen SPF 30+ to sun-exposed areas, reapply every 2 hours as needed.  ?Recommend staying in the shade or wearing long sleeves, sun glasses (UVA+UVB protection) and wide brim hats (4-inch brim around the entire circumference of the hat). ?Call for new or changing lesions.  ? ? ? ? ?Seborrheic Keratosis ? ?What causes seborrheic keratoses? ?Seborrheic keratoses are harmless, common skin growths that first appear during adult life.  As time goes by, more growths appear.  Some people may develop a large number of them.  Seborrheic keratoses appear on both covered and uncovered body parts.  They are not caused by sunlight.  The tendency to develop seborrheic keratoses can be inherited.  They vary in color from skin-colored to gray, brown, or even black.  They can be either  smooth or have a rough, warty surface.   ?Seborrheic keratoses are superficial and look as if they were stuck on the skin.  Under the microscope this type of keratosis looks like layers upon layers of skin.  That is why at times the top layer may seem to fall off, but the rest of the growth remains and re-grows.   ? ?Treatment ?Seborrheic keratoses do not need to be treated, but can easily be removed in the office.  Seborrheic keratoses often cause symptoms when they rub on clothing or jewelry.  Lesions can be in the way of shaving.  If they become inflamed, they can cause itching, soreness, or burning.  Removal of a seborrheic keratosis can be accomplished by freezing, burning, or surgery. ?If any spot bleeds, scabs, or grows rapidly, please return to have it checked, as these can be an indication of a skin cancer. ? ?Cryotherapy Aftercare ? ?Wash gently with soap and water everyday.   ?Apply Vaseline and Band-Aid daily until healed.  ? ? ? ? ? ? ?If You Need Anything After Your Visit ? ?If you have any questions or concerns for your doctor, please call our main line at [phone] and press option 4 to reach your doctor's medical assistant. If no one answers, please leave a voicemail as directed and we will return your call as soon as possible. Messages left after 4 pm will be answered the following business day.  ? ?You may also send [place] a message via MyChart. We typically respond to MyChart messages within 1-2 business days. ? ?For prescription refills, please ask your pharmacy to  contact our office. Our fax number is [phone]. ? ?If you have an urgent issue when the clinic is closed that cannot wait until the next business day, you can page your doctor at the number below.   ? ?Please note that while we do our best to be available for urgent issues outside of office hours, we are not available 24/7.  ? ?If you have an urgent issue and are unable to reach [place], you may choose to seek medical care at your  doctor's office, retail clinic, urgent care center, or emergency room. ? ?If you have a medical emergency, please immediately call 911 or go to the emergency department. ? ?Pager Numbers ? ?- Dr. Nehemiah Massed: [phone] ? ?- Dr. Laurence Ferrari: [phone] ? ?- Dr. Nicole Kindred: [phone] ? ?In the event of inclement weather, please call our main line at [phone] for an update on the status of any delays or closures. ? ?Dermatology Medication Tips: ?Please keep the boxes that topical medications come in in order to help keep track of the instructions about where and how to use these. Pharmacies typically print the medication instructions only on the boxes and not directly on the medication tubes.  ? ?If your medication is too expensive, please contact our office at [phone] option 4 or send [place] a message through [place].  ? ?We are unable to tell what your co-pay for medications will be in advance as this is different depending on your insurance coverage. However, we may be able to find a substitute medication at lower cost or fill out paperwork to get insurance to cover a needed medication.  ? ?If a prior authorization is required to get your medication covered by your insurance company, please allow [place] 1-2 business days to complete this process. ? ?Drug prices often vary depending on where the prescription is filled and some pharmacies may offer cheaper prices. ? ?The website www.goodrx.com contains coupons for medications through different pharmacies. The prices here do not account for what the cost may be with help from insurance (it may be cheaper with your insurance), but the website can give you the price if you did not use any insurance.  ?- You can print the associated coupon and take it with your prescription to the pharmacy.  ?- You may also stop by our office during regular business hours and pick up a GoodRx coupon card.  ?- If you need your prescription sent electronically to a different pharmacy, notify  our office through [facility] or by phone at [phone] option 4. ? ? ? ? ?Si Usted Necesita Algo Despu?s de Su Visita ? ?Tambi?n puede enviarnos un mensaje a trav?s de MyChart. Por lo general respondemos a los mensajes de MyChart en el transcurso de 1 a 2 d?as h?biles. ? ?Para renovar recetas, por favor pida a su farmacia que se ponga en contacto con nuestra oficina. Nuestro n?mero de fax es el [phone]. ? ?Si tiene un asunto urgente cuando la cl?nica est? cerrada y que no puede esperar hasta el siguiente d?a h?bil, puede llamar/localizar a su doctor(a) al n?mero que aparece a continuaci?n.  ? ?Por favor, tenga en cuenta que aunque hacemos todo lo posible para estar disponibles para asuntos urgentes fuera del horario de oficina, no estamos disponibles las 24 horas del d?a, los 7 d?as de la semana.  ? ?Si tiene un problema urgente y no puede comunicarse con nosotros, puede optar por buscar atenci?n m?dica  en el consultorio de su doctor(a), en una cl?nica  privada, en un centro de atenci?n urgente o en una sala de emergencias. ? ?Si tiene Engineer, maintenance (IT) m?dica, por favor llame inmediatamente al 911 o vaya a la sala de emergencias. ? ?N?meros de b?per ? ?- Dr. Nehemiah Massed: [phone] ? ?- Dra. Moye: [phone] ? ?- Dra. Nicole Kindred: [phone] ? ?En caso de inclemencias del tiempo, por favor llame a nuestra l?nea principal al [phone] para una actualizaci?n sobre el estado de cualquier retraso o cierre. ? ?Consejos para la medicaci?n en dermatolog?a: ?Por favor, guarde las cajas en las que vienen los medicamentos de uso t?pico para ayudarle a seguir las instrucciones sobre d?nde y c?mo usarlos. Las farmacias generalmente imprimen las instrucciones del medicamento s?lo en las cajas y no directamente en los tubos del [place].  ? ?Si su medicamento es muy caro, por favor, p?ngase en contacto con Zigmund Daniel llamando al [phone] y presione la opci?n 4 o env?enos un mensaje a trav?s de  MyChart.  ? ?No podemos decirle cu?l ser? su copago por los medicamentos por adelantado ya que esto es diferente dependiendo de la cobertura de su seguro. Sin embargo, es posible que podamos encontrar un medicamento su

## 2021-12-05 NOTE — Progress Notes (Signed)
? ?Follow-Up Visit ?  ?Subjective  ?Allison Hampton is a 88 y.o. female who presents for the following: Follow-up (Patient here today for follow up. Recently had scc bx and treated with ED&C at left mid pretibia. She reports area is healing well. She is also follow up on DSAP. She reports no new areas of concern. ). ? ? ?The patient has spots, moles and lesions to be evaluated, some may be new or changing and the patient has concerns that these could be cancer. ? ? ?The following portions of the chart were reviewed this encounter and updated as appropriate:   ?  ? ?Review of Systems: No other skin or systemic complaints except as noted in HPI or Assessment and Plan. ? ? ?Objective  ?Well appearing patient in no apparent distress; mood and affect are within normal limits. ? ?A focused examination was performed including legs, face. Relevant physical exam findings are noted in the Assessment and Plan. ? ?right posterior ankle x 7, right lower knee x 1, left anterior ankle x 1, left lower knee x 1, left lower pretibia below scar x 1 (11) ?Pink scaly papules  ? ? ?Assessment & Plan  ?DSAP (disseminated superficial actinic porokeratosis) ?Right Lower Leg - Anterior ? ?Improving ? ?DSAP is a chronic inherited condition of sun-exposed skin, most commonly affecting the arms and legs.  It is difficult to treat.  Recommend photoprotection and regular use of spf 30 or higher sunscreen to prevent worsening of condition and precancerous changes. ? ?continue Cholesterol 2% Lovastatin 2% Cream 240 gm- Apply twice daily as directed to affected areas arms and legs.  Make sure to apply to back of legs. Sent to [facility] ? ?Recommend continuing moisturizer with exfoliant (Urea, Salicylic acid, or Lactic acid) one to two times daily to help smooth rough and bumpy skin.  OTC options include Cetaphil Rough and Bumpy lotion (Urea), Eucerin Roughness Relief lotion or spot treatment cream (Urea), CeraVe SA lotion/cream for Rough  and Bumpy skin (Sal Acid), Gold Bond Rough and Bumpy cream (Sal Acid), and AmLactin 12% lotion/cream (Lactic Acid).  If applying in morning, also apply sunscreen to sun-exposed areas, since these exfoliating moisturizers can increase sensitivity to sun.  ? ?Actinic keratosis (11) ?right posterior ankle x 7, right lower knee x 1, left anterior ankle x 1, left lower knee x 1, left lower pretibia below scar x 1 ? ?Will treat AKs at left cheek, right upper forehead after Easter holiday ? ? ?Actinic keratoses are precancerous spots that appear secondary to cumulative UV radiation exposure/sun exposure over time. They are chronic with expected duration over 1 year. A portion of actinic keratoses will progress to squamous cell carcinoma of the skin. It is not possible to reliably predict which spots will progress to skin cancer and so treatment is recommended to prevent development of skin cancer. ? ?Recommend daily broad spectrum sunscreen SPF 30+ to sun-exposed areas, reapply every 2 hours as needed.  ?Recommend staying in the shade or wearing long sleeves, sun glasses (UVA+UVB protection) and wide brim hats (4-inch brim around the entire circumference of the hat). ?Call for new or changing lesions. ? ?Destruction of lesion - right posterior ankle x 7, right lower knee x 1, left anterior ankle x 1, left lower knee x 1, left lower pretibia below scar x 1 ? ?Destruction method: cryotherapy   ?Informed consent: discussed and consent obtained   ?Lesion destroyed using liquid nitrogen: Yes   ?Region frozen until ice ball  extended beyond lesion: Yes   ?Outcome: patient tolerated procedure well with no complications   ?Post-procedure details: wound care instructions given   ?Additional details:  Prior to procedure, discussed risks of blister formation, small wound, skin dyspigmentation, or rare scar following cryotherapy. Recommend Vaseline ointment to treated areas while healing. ? ? ? ?Seborrheic Keratoses ?- Stuck-on, waxy,  tan-brown papules and/or plaques right medial ankle , left lower cheek ?- Benign-appearing ?- Discussed benign etiology and prognosis. ?- Observe ?- Call for any changes ? ?Actinic Damage ?- chronic, secondary to cumulative UV radiation exposure/sun exposure over time ?- diffuse scaly erythematous macules with underlying dyspigmentation ?- Recommend daily broad spectrum sunscreen SPF 30+ to sun-exposed areas, reapply every 2 hours as needed.  ?- Recommend staying in the shade or wearing long sleeves, sun glasses (UVA+UVB protection) and wide brim hats (4-inch brim around the entire circumference of the hat). ?- Call for new or changing lesions. ? ?History of Squamous Cell Carcinoma of the Skin ?- No evidence of recurrence today left mid pretibia  ED&C  ?- Recommend regular full body skin exams ?- Recommend daily broad spectrum sunscreen SPF 30+ to sun-exposed areas, reapply every 2 hours as needed.  ?- Call if any new or changing lesions are noted between office visits ? ?Return for 2 month ak follow up. ?I, Ruthell Rummage, CMA, am acting as scribe for Brendolyn Patty, MD. ? ?Documentation: I have reviewed the above documentation for accuracy and completeness, and I agree with the above. ? ?Brendolyn Patty MD  ? ?

## 2022-02-06 ENCOUNTER — Ambulatory Visit (INDEPENDENT_AMBULATORY_CARE_PROVIDER_SITE_OTHER): Payer: Medicare Other | Admitting: Dermatology

## 2022-02-06 DIAGNOSIS — L565 Disseminated superficial actinic porokeratosis (DSAP): Secondary | ICD-10-CM

## 2022-02-06 DIAGNOSIS — L578 Other skin changes due to chronic exposure to nonionizing radiation: Secondary | ICD-10-CM

## 2022-02-06 DIAGNOSIS — L821 Other seborrheic keratosis: Secondary | ICD-10-CM

## 2022-02-06 DIAGNOSIS — L570 Actinic keratosis: Secondary | ICD-10-CM | POA: Diagnosis not present

## 2022-02-06 DIAGNOSIS — L720 Epidermal cyst: Secondary | ICD-10-CM

## 2022-02-06 DIAGNOSIS — Z85828 Personal history of other malignant neoplasm of skin: Secondary | ICD-10-CM

## 2022-02-06 NOTE — Patient Instructions (Signed)
Due to recent changes in healthcare laws, you may see results of your pathology and/or laboratory studies on MyChart before the doctors have had a chance to review them. We understand that in some cases there may be results that are confusing or concerning to you. Please understand that not all results are received at the same time and often the doctors may need to interpret multiple results in order to provide you with the best plan of care or course of treatment. Therefore, we ask that you please give us 2 business days to thoroughly review all your results before contacting the office for clarification. Should we see a critical lab result, you will be contacted sooner.   If You Need Anything After Your Visit  If you have any questions or concerns for your doctor, please call our main line at [phone] and press option 4 to reach your doctor's medical assistant. If no one answers, please leave a voicemail as directed and we will return your call as soon as possible. Messages left after 4 pm will be answered the following business day.   You may also send us a message via MyChart. We typically respond to MyChart messages within 1-2 business days.  For prescription refills, please ask your pharmacy to contact our office. Our fax number is [phone].  If you have an urgent issue when the clinic is closed that cannot wait until the next business day, you can page your doctor at the number below.    Please note that while we do our best to be available for urgent issues outside of office hours, we are not available 24/7.   If you have an urgent issue and are unable to reach us, you may choose to seek medical care at your doctor's office, retail clinic, urgent care center, or emergency room.  If you have a medical emergency, please immediately call 911 or go to the emergency department.  Pager Numbers  - Dr. Kowalski: [phone]  - Dr. Moye: [phone]  - Dr. Stewart:  [phone]  In the event of inclement weather, please call our main line at [phone] for an update on the status of any delays or closures.  Dermatology Medication Tips: Please keep the boxes that topical medications come in in order to help keep track of the instructions about where and how to use these. Pharmacies typically print the medication instructions only on the boxes and not directly on the medication tubes.   If your medication is too expensive, please contact our office at [phone] option 4 or send us a message through MyChart.   We are unable to tell what your co-pay for medications will be in advance as this is different depending on your insurance coverage. However, we may be able to find a substitute medication at lower cost or fill out paperwork to get insurance to cover a needed medication.   If a prior authorization is required to get your medication covered by your insurance company, please allow us 1-2 business days to complete this process.  Drug prices often vary depending on where the prescription is filled and some pharmacies may offer cheaper prices.  The website www.goodrx.com contains coupons for medications through different pharmacies. The prices here do not account for what the cost may be with help from insurance (it may be cheaper with your insurance), but the website can give you the price if you did not use any insurance.  - You can print the associated coupon and take it with   your prescription to the pharmacy.  - You may also stop by our office during regular business hours and pick up a GoodRx coupon card.  - If you need your prescription sent electronically to a different pharmacy, notify our office through [facility] MyChart or by phone at [phone] option 4.     Si Usted Necesita Algo Despus de Su Visita  Tambin puede enviarnos un mensaje a travs de MyChart. Por lo general respondemos a los mensajes de MyChart en el transcurso de 1 a 2  das hbiles.  Para renovar recetas, por favor pida a su farmacia que se ponga en contacto con nuestra oficina. Nuestro nmero de fax es el [phone].  Si tiene un asunto urgente cuando la clnica est cerrada y que no puede esperar hasta el siguiente da hbil, puede llamar/localizar a su doctor(a) al nmero que aparece a continuacin.   Por favor, tenga en cuenta que aunque hacemos todo lo posible para estar disponibles para asuntos urgentes fuera del horario de oficina, no estamos disponibles las 24 horas del da, los 7 das de la semana.   Si tiene un problema urgente y no puede comunicarse con nosotros, puede optar por buscar atencin mdica  en el consultorio de su doctor(a), en una clnica privada, en un centro de atencin urgente o en una sala de emergencias.  Si tiene una emergencia mdica, por favor llame inmediatamente al 911 o vaya a la sala de emergencias.  Nmeros de bper  - Dr. Kowalski: [phone]  - Dra. Moye: [phone]  - Dra. Stewart: [phone]  En caso de inclemencias del tiempo, por favor llame a nuestra lnea principal al [phone] para una actualizacin sobre el estado de cualquier retraso o cierre.  Consejos para la medicacin en dermatologa: Por favor, guarde las cajas en las que vienen los medicamentos de uso tpico para ayudarle a seguir las instrucciones sobre dnde y cmo usarlos. Las farmacias generalmente imprimen las instrucciones del medicamento slo en las cajas y no directamente en los tubos del medicamento.   Si su medicamento es muy caro, por favor, pngase en contacto con nuestra oficina llamando al [phone] y presione la opcin 4 o envenos un mensaje a travs de MyChart.   No podemos decirle cul ser su copago por los medicamentos por adelantado ya que esto es diferente dependiendo de la cobertura de su seguro. Sin embargo, es posible que podamos encontrar un medicamento sustituto a menor costo o llenar un formulario para que el  seguro cubra el medicamento que se considera necesario.   Si se requiere una autorizacin previa para que su compaa de seguros cubra su medicamento, por favor permtanos de 1 a 2 das hbiles para completar este proceso.  Los precios de los medicamentos varan con frecuencia dependiendo del lugar de dnde se surte la receta y alguna farmacias pueden ofrecer precios ms baratos.  El sitio web www.goodrx.com tiene cupones para medicamentos de diferentes farmacias. Los precios aqu no tienen en cuenta lo que podra costar con la ayuda del seguro (puede ser ms barato con su seguro), pero el sitio web puede darle el precio si no utiliz ningn seguro.  - Puede imprimir el cupn correspondiente y llevarlo con su receta a la farmacia.  - Tambin puede pasar por nuestra oficina durante el horario de atencin regular y recoger una tarjeta de cupones de GoodRx.  - Si necesita que su receta se enve electrnicamente a una farmacia diferente, informe a nuestra oficina a travs de MyChart de [facility]   o por telfono llamando al [phone] y presione la opcin 4.  

## 2022-02-06 NOTE — Progress Notes (Signed)
Follow-Up Visit   Subjective  Allison Hampton is a 88 y.o. female who presents for the following: Actinic Keratosis (right posterior ankle x 7, right lower knee x 1, left anterior ankle x 1, left lower knee x 1, left lower pretibia below scar x 1 - recheck previously treated precancerous skin lesions and treat the left cheek, and right upper forehead ). Patient has a cyst in her left ear that she would like checked today. Her hair stylist did get some pus out of it.     The following portions of the chart were reviewed this encounter and updated as appropriate:       Review of Systems:  No other skin or systemic complaints except as noted in HPI or Assessment and Plan.  Objective  Well appearing patient in no apparent distress; mood and affect are within normal limits.  A focused examination was performed including the face and legs. Relevant physical exam findings are noted in the Assessment and Plan.  R forearm x 3, R upper forehead x 1, glabella x 1, L cheek x 2, L hand x 5, L forearm x 5, R hand x 3, R calf x 3, R lat pretibia x 3, L foot x 2 (28) Erythematous thin papules/macules with gritty scale.   L ear canal Crusted lesion in ear canal  B/L leg Multiple pink scaly macules with keratotic rim- with smoothing    Assessment & Plan  AK (actinic keratosis) (28) R forearm x 3, R upper forehead x 1, glabella x 1, L cheek x 2, L hand x 5, L forearm x 5, R hand x 3, R calf x 3, R lat pretibia x 3, L foot x 2  Actinic keratoses are precancerous spots that appear secondary to cumulative UV radiation exposure/sun exposure over time. They are chronic with expected duration over 1 year. A portion of actinic keratoses will progress to squamous cell carcinoma of the skin. It is not possible to reliably predict which spots will progress to skin cancer and so treatment is recommended to prevent development of skin cancer.  Recommend daily broad spectrum sunscreen SPF 30+ to  sun-exposed areas, reapply every 2 hours as needed.  Recommend staying in the shade or wearing long sleeves, sun glasses (UVA+UVB protection) and wide brim hats (4-inch brim around the entire circumference of the hat). Call for new or changing lesions.   Destruction of lesion - R forearm x 3, R upper forehead x 1, glabella x 1, L cheek x 2, L hand x 5, L forearm x 5, R hand x 3, R calf x 3, R lat pretibia x 3, L foot x 2  Destruction method: cryotherapy   Informed consent: discussed and consent obtained   Lesion destroyed using liquid nitrogen: Yes   Region frozen until ice ball extended beyond lesion: Yes   Outcome: patient tolerated procedure well with no complications   Post-procedure details: wound care instructions given   Additional details:  Prior to procedure, discussed risks of blister formation, small wound, skin dyspigmentation, or rare scar following cryotherapy. Recommend Vaseline ointment to treated areas while healing.   Related Medications mupirocin ointment (BACTROBAN) 2 % Apply 1-3 times per day to sores on legs.  Epidermal inclusion cyst L ear canal  H/o inflammation, symptomatic  Recommend ENT evaluation since she has difficulty placing her hearing aid in that ear when cyst is inflamed.     Disseminated superficial actinic porokeratosis B/L leg  Improving with topical chol/statin  cream  DSAP is a chronic inherited condition of sun-exposed skin, most commonly affecting the arms and legs.  It is difficult to treat.  Recommend photoprotection and regular use of spf 30 or higher sunscreen to prevent worsening of condition and precancerous changes.  continue Cholesterol 2% Lovastatin 2% Cream 240 gm- Apply twice daily as directed to affected areas arms and legs.  Sent to [facility]     Actinic Damage - chronic, secondary to cumulative UV radiation exposure/sun exposure over time - diffuse scaly erythematous macules with underlying dyspigmentation -  Recommend daily broad spectrum sunscreen SPF 30+ to sun-exposed areas, reapply every 2 hours as needed.  - Recommend staying in the shade or wearing long sleeves, sun glasses (UVA+UVB protection) and wide brim hats (4-inch brim around the entire circumference of the hat). - Call for new or changing lesions.  History of Squamous Cell Carcinoma of the Skin - L mid pretibial  - No evidence of recurrence today - Recommend regular full body skin exams - Recommend daily broad spectrum sunscreen SPF 30+ to sun-exposed areas, reapply every 2 hours as needed.  - Call if any new or changing lesions are noted between office visits  Seborrheic Keratoses - Stuck-on, waxy, tan-brown papules and/or plaques  - Benign-appearing - Discussed benign etiology and prognosis. - Observe - Call for any changes  History of Skin Cancer- sebaceous carcinoma R mid lower eyelid  Clear. Observe for recurrence.  Call clinic for new or changing lesions.   Recommend regular skin exams, daily broad-spectrum spf 30+ sunscreen use, and photoprotection.       Return in about 3 months (around 05/09/2022) for AK follow up .  Luther Redo, CMA, am acting as scribe for Brendolyn Patty, MD .  Documentation: I have reviewed the above documentation for accuracy and completeness, and I agree with the above.  Brendolyn Patty MD

## 2022-03-12 ENCOUNTER — Other Ambulatory Visit: Payer: Self-pay

## 2022-03-12 ENCOUNTER — Emergency Department: Payer: Medicare Other

## 2022-03-12 DIAGNOSIS — R519 Headache, unspecified: Secondary | ICD-10-CM | POA: Diagnosis present

## 2022-03-12 DIAGNOSIS — E871 Hypo-osmolality and hyponatremia: Secondary | ICD-10-CM | POA: Diagnosis not present

## 2022-03-12 DIAGNOSIS — I10 Essential (primary) hypertension: Secondary | ICD-10-CM | POA: Diagnosis not present

## 2022-03-12 DIAGNOSIS — Z79899 Other long term (current) drug therapy: Secondary | ICD-10-CM | POA: Diagnosis not present

## 2022-03-12 LAB — BASIC METABOLIC PANEL
Anion gap: 9 (ref 5–15)
BUN: 11 mg/dL (ref 8–23)
CO2: 26 mmol/L (ref 22–32)
Calcium: 9.2 mg/dL (ref 8.9–10.3)
Chloride: 98 mmol/L (ref 98–111)
Creatinine, Ser: 0.46 mg/dL (ref 0.44–1.00)
GFR, Estimated: >60 mL/min (ref >60)
Glucose, Bld: 127 mg/dL — ABNORMAL HIGH (ref 70–99)
Potassium: 4.8 mmol/L (ref 3.5–5.1)
Sodium: 133 mmol/L — ABNORMAL LOW (ref 135–145)

## 2022-03-12 LAB — TROPONIN I (HIGH SENSITIVITY)
Troponin I (High Sensitivity): 8 ng/L (ref <18)
Troponin I (High Sensitivity): 10 ng/L (ref <18)

## 2022-03-12 LAB — CBC
HCT: 41.8 % (ref 36.0–46.0)
Hemoglobin: 13.5 g/dL (ref 12.0–15.0)
MCH: 31 pg (ref 26.0–34.0)
MCHC: 32.3 g/dL (ref 30.0–36.0)
MCV: 96.1 fL (ref 80.0–100.0)
Platelets: 255 10*3/uL (ref 150–400)
RBC: 4.35 MIL/uL (ref 3.87–5.11)
RDW: 12.6 % (ref 11.5–15.5)
WBC: 5.6 10*3/uL (ref 4.0–10.5)
nRBC: 0 % (ref 0.0–0.2)

## 2022-03-12 LAB — MAGNESIUM: Magnesium: 2.2 mg/dL (ref 1.7–2.4)

## 2022-03-12 MED ORDER — ACETAMINOPHEN 325 MG PO TABS
650.0000 mg | ORAL_TABLET | Freq: Once | ORAL | Status: DC
  Filled 2022-03-12: qty 2

## 2022-03-12 NOTE — ED Provider Triage Note (Signed)
Emergency Medicine Provider Triage Evaluation Note  Allison Hampton July , a 88 y.o. female  was evaluated in triage.  Pt complains of headache that began 2pm. Patient reports she had a left sided headache yesterday, but now in the back of her head only. Patient reports headache is in the back of her head. Takes losartan and amlodipine. Saw her PCP last week and BP was in the 341P systolic, so advised her to stop taking amlodipine. She didn't take it Friday or Saturday, but Sunday her BP was high and she resumed it. Took BP today and it was 379 systolic so she called EMS. Headache began gradually today, not all of a sudden.  Review of Systems  Positive: headache Negative: Vomiting, vision changes  Physical Exam  BP (!) 144/86 (BP Location: Left Arm)   Pulse 70   Temp 99.2 F (37.3 C) (Oral)   Resp 18   SpO2 93%  Gen:   Awake, no distress   Resp:  Normal effort  MSK:   Moves extremities without difficulty  Other:    Medical Decision Making  Medically screening exam initiated at 7:20 PM.  Appropriate orders placed.  Allison Hampton was informed that the remainder of the evaluation will be completed by another provider, this initial triage assessment does not replace that evaluation, and the importance of remaining in the ED until their evaluation is complete.     Marquette Old, PA-C 03/12/22 1927

## 2022-03-12 NOTE — ED Triage Notes (Signed)
Pt presents via POV c/o headache since 1400 today. Reports headache is left sided. Reports recent change to BP medication. Ambulatory to triage.

## 2022-03-13 ENCOUNTER — Emergency Department
Admission: EM | Admit: 2022-03-13 | Discharge: 2022-03-13 | Disposition: A | Discharge status: Home / Self Care | Payer: Medicare Other | Attending: Emergency Medicine | Admitting: Emergency Medicine

## 2022-03-13 DIAGNOSIS — I10 Essential (primary) hypertension: Secondary | ICD-10-CM

## 2022-03-13 DIAGNOSIS — R519 Headache, unspecified: Secondary | ICD-10-CM

## 2022-03-13 NOTE — ED Provider Notes (Signed)
[facility] [place] Provider Note    Event Date/Time   First MD Initiated Contact with Patient 03/13/22 0100     (approximate)   History   Headache   HPI  Allison Hampton is a 88 y.o. female who is generally well and active for her age.  Dr. Loney Hering is her PCP.  She presents for evaluation of headache.  She said that she started having headache that is primarily in the back of her head but sometimes up in the front on the left side.  It started earlier today and comes and goes but does not go away completely.  She was concerned because she recently was taken off of her amlodipine by her PCP given concerns that her blood pressure was getting too low.  She was having consistently elevated blood pressure measurements so she has been back on the amlodipine for the last couple of days, but still is having higher blood pressures than usual and did not know if the headache was related.  Other than that she feels well.  She denies neck pain or stiffness, chest pain, shortness of breath, cough, nausea, vomiting, abdominal pain, and dysuria.     Physical Exam   Triage Vital Signs: ED Triage Vitals  Enc Vitals Group     BP 03/12/22 1911 (!) 144/86     Pulse Rate 03/12/22 1911 70     Resp 03/12/22 1911 18     Temp 03/12/22 1911 99.2 F (37.3 C)     Temp Source 03/12/22 1911 Oral     SpO2 03/12/22 1911 93 %     Weight 03/12/22 1924 66.7 kg (147 lb 0.8 oz)     Height --      Head Circumference --      Peak Flow --      Pain Score 03/12/22 1924 3     Pain Loc --      Pain Edu? --      Excl. in [place]? --     Most recent vital signs: Vitals:   03/12/22 1911 03/12/22 2249  BP: (!) 144/86 (!) 175/96  Pulse: 70 80  Resp: 18 18  Temp: 99.2 F (37.3 C) 98.6 F (37 C)  SpO2: 93% 91%     General: Awake, no distress.  Well-appearing for age, appears younger than stated age, very sharp mentally. CV:  Good peripheral perfusion.  Normal heart sounds.  Occasional  PVC. Resp:  Normal effort.  Lungs are clear to auscultation bilaterally. Abd:  No distention.  No tenderness to palpation. Other:  No focal neurological deficits.  Good mentation.   ED Results / Procedures / Treatments   Labs (all labs ordered are listed, but only abnormal results are displayed) Labs Reviewed  BASIC METABOLIC PANEL - Abnormal; Notable for the following components:      Result Value   Sodium 133 (*)    Glucose, Bld 127 (*)    All other components within normal limits  CBC  MAGNESIUM  TROPONIN I (HIGH SENSITIVITY)  TROPONIN I (HIGH SENSITIVITY)     EKG  ED ECG REPORT I, Hinda Kehr, the attending physician, personally viewed and interpreted this ECG.  Date: 03/12/2022 EKG Time: 19: 30 Rate: 96 Rhythm: Sinus rhythm with frequent PVC QRS Axis: normal Intervals: normal ST/T Wave abnormalities: Non-specific ST segment / T-wave changes, but no clear evidence of acute ischemia. Narrative Interpretation: no definitive evidence of acute ischemia; does not meet STEMI criteria.    RADIOLOGY  See hospital course for details: 2 view chest x-ray and head CT are reassuring with no acute abnormalities.    PROCEDURES:  Critical Care performed: No  Procedures   MEDICATIONS ORDERED IN ED: Medications  acetaminophen (TYLENOL) tablet 650 mg (650 mg Oral Not Given 03/12/22 2300)     IMPRESSION / MDM / ASSESSMENT AND PLAN / ED COURSE  I reviewed the triage vital signs and the nursing notes.                              Differential diagnosis includes, but is not limited to, intracranial bleed, neoplasm, poorly controlled blood pressure or recent blood pressure changes, medication side effect, viral or other nonspecific illness.  Patient's presentation is most consistent with acute presentation with potential threat to life or bodily function.  Vital signs notable for hypertension, but extremely variable, ranging from about 132 systolic up to 440N.  She and  her daughter report that at home it was greater than 200.  Otherwise normal vitals.  EKG shows PVCs, otherwise no significant abnormalities.  I viewed and interpreted the patient's two-view chest x-ray and her head CT without contrast, and I see no evidence of any acute infectious process on the chest x-ray and no evidence of acute intracranial bleed nor neoplasm on the head CT.  I also read the radiologist's reports, which confirmed no acute findings on either studies.  Labs ordered include CBC, magnesium, high sensitive troponin x2, and basic metabolic panel.  The results are all essentially normal other than very mild hyponatremia, not clinically significant.  Overall evaluation is very reassuring.  I considered admission and initially based on acute headache and her age, but after evaluating her and seeing her thorough and reassuring work-up, it is clear that she can follow-up as an outpatient and has no immediately life-threatening illness.  The patient is eager to go home as well.  She will continue taking her blood pressure medicine and follow-up with her PCP about blood pressure management.  She currently has no headache and Tylenol seems to help, so she will use it as needed according label instructions.  I gave my usual customary return precautions.     FINAL CLINICAL IMPRESSION(S) / ED DIAGNOSES   Final diagnoses:  Acute nonintractable headache, unspecified headache type  Essential hypertension     Rx / DC Orders   ED Discharge Orders     None        Note:  This document was prepared using Dragon voice recognition software and may include unintentional dictation errors.   Hinda Kehr, MD 03/13/22 0157

## 2022-03-13 NOTE — Discharge Instructions (Addendum)
As we discussed, your evaluation was reassuring.  We did not identify a specific cause of your headache, although it could have something to do with the various changes recently to your blood pressure medicine.  Please continue taking the medicines as prescribed and follow-up with your PCP at the next available opportunity.  Use over-the-counter ibuprofen and/or Tylenol according to label instructions as needed.  Return to the emergency department if you develop new or worsening symptoms that concern you.

## 2022-06-11 ENCOUNTER — Ambulatory Visit (INDEPENDENT_AMBULATORY_CARE_PROVIDER_SITE_OTHER): Payer: Medicare Other | Admitting: Dermatology

## 2022-06-11 DIAGNOSIS — L821 Other seborrheic keratosis: Secondary | ICD-10-CM | POA: Diagnosis not present

## 2022-06-11 DIAGNOSIS — T148XXD Other injury of unspecified body region, subsequent encounter: Secondary | ICD-10-CM

## 2022-06-11 DIAGNOSIS — C44729 Squamous cell carcinoma of skin of left lower limb, including hip: Secondary | ICD-10-CM | POA: Diagnosis not present

## 2022-06-11 DIAGNOSIS — L578 Other skin changes due to chronic exposure to nonionizing radiation: Secondary | ICD-10-CM

## 2022-06-11 DIAGNOSIS — D492 Neoplasm of unspecified behavior of bone, soft tissue, and skin: Secondary | ICD-10-CM

## 2022-06-11 DIAGNOSIS — Z85828 Personal history of other malignant neoplasm of skin: Secondary | ICD-10-CM

## 2022-06-11 DIAGNOSIS — L565 Disseminated superficial actinic porokeratosis (DSAP): Secondary | ICD-10-CM | POA: Diagnosis not present

## 2022-06-11 DIAGNOSIS — L570 Actinic keratosis: Secondary | ICD-10-CM

## 2022-06-11 MED ORDER — MUPIROCIN 2 % EX OINT: TOPICAL_OINTMENT | CUTANEOUS | 0 refills | Status: DC

## 2022-06-11 NOTE — Progress Notes (Unsigned)
Follow-Up Visit   Subjective  Allison Hampton is a 88 y.o. female who presents for the following: Follow-up (5 months f/u on precancers on face,hands,arms and legs. ). Check the left leg patient report she dropped her I pad on her leg ~8 weeks ago she was seen by her PCP 05/24/22 they wrapped her left leg with a unna boot and started antibiotics which helped area now smaller. She has a growth on L foot she would like checked.   The following portions of the chart were reviewed this encounter and updated as appropriate:       Review of Systems:  No other skin or systemic complaints except as noted in HPI or Assessment and Plan.  Objective  Well appearing patient in no apparent distress; mood and affect are within normal limits.  A focused examination was performed including face,arms,legs,feet. Relevant physical exam findings are noted in the Assessment and Plan.  right hand x 4, left hand x 6  (10) (10) Erythematous thin papules/macules with gritty scale.   left foot dorsum 1.3 cm pink keratotic papule          lower legs Multiple erythematous scaly macules/patches with keratotic rim  left pretibia Healing wound     Assessment & Plan  AK (actinic keratosis) (10) right hand x 4, left hand x 6  (10)  Actinic keratoses are precancerous spots that appear secondary to cumulative UV radiation exposure/sun exposure over time. They are chronic with expected duration over 1 year. A portion of actinic keratoses will progress to squamous cell carcinoma of the skin. It is not possible to reliably predict which spots will progress to skin cancer and so treatment is recommended to prevent development of skin cancer.  Recommend daily broad spectrum sunscreen SPF 30+ to sun-exposed areas, reapply every 2 hours as needed.  Recommend staying in the shade or wearing long sleeves, sun glasses (UVA+UVB protection) and wide brim hats (4-inch brim around the entire circumference of the  hat). Call for new or changing lesions.   Destruction of lesion - right hand x 4, left hand x 6  (10)  Destruction method: cryotherapy   Informed consent: discussed and consent obtained   Timeout:  patient name, date of birth, surgical site, and procedure verified Lesion destroyed using liquid nitrogen: Yes   Region frozen until ice ball extended beyond lesion: Yes   Outcome: patient tolerated procedure well with no complications   Post-procedure details: wound care instructions given   Additional details:  Prior to procedure, discussed risks of blister formation, small wound, skin dyspigmentation, or rare scar following cryotherapy. Recommend Vaseline ointment to treated areas while healing.   Related Medications mupirocin ointment (BACTROBAN) 2 % Apply 1-3 times per day to sores on legs.  Neoplasm of skin left foot dorsum  Epidermal / dermal shaving  Lesion diameter (cm):  1.3 Informed consent: discussed and consent obtained   Timeout: patient name, date of birth, surgical site, and procedure verified   Procedure prep:  Patient was prepped and draped in usual sterile fashion Prep type:  Isopropyl alcohol Anesthesia: the lesion was anesthetized in a standard fashion   Anesthetic:  1% lidocaine w/ epinephrine 1-100,000 buffered w/ 8.4% NaHCO3 Hemostasis achieved with: pressure, aluminum chloride and electrodesiccation   Outcome: patient tolerated procedure well    Destruction of lesion  Destruction method: electrodesiccation and curettage   Informed consent: discussed and consent obtained   Timeout:  patient name, date of birth, surgical site, and procedure  verified Curettage performed in three different directions: Yes   Electrodesiccation performed over the curetted area: Yes   Final wound size (cm):  1.3 Hemostasis achieved with:  electrodesiccation Outcome: patient tolerated procedure well with no complications   Post-procedure details: sterile dressing applied and  wound care instructions given   Additional details:  Mupirocin ointment and Bandaid applied   mupirocin ointment (BACTROBAN) 2 % Apply to wounds qd-bid  Specimen 1 - Surgical pathology Differential Diagnosis: R/O SCC  Check Margins: No  DSAP (disseminated superficial actinic porokeratosis) lower legs  Stable   DSAP is a chronic inherited condition of sun-exposed skin, most commonly affecting the arms and legs.  It is difficult to treat.  Recommend photoprotection and regular use of spf 30 or higher sunscreen to prevent worsening of condition and precancerous changes.   continue Cholesterol 2% Lovastatin 2% Cream 240 gm- Apply twice daily as directed to affected areas arms and legs.     Wound healing, delayed left pretibia  Wound healing  Applied non stick pad with Mupirocin ointment and coban wrap in the office today   Cleanse skin daily with mild soap and water, pat dry apply Mupirocin ointment and band aid Start Mupirocin ointment apply to affected skin qd-bid   mupirocin ointment (BACTROBAN) 2 % - left pretibia Apply to wounds qd-bid  Seborrheic Keratoses Face  - Stuck-on, waxy, tan-brown papules and/or plaques  - Benign-appearing - Discussed benign etiology and prognosis. - Observe - Call for any changes   Actinic Damage Legs,arms - chronic, secondary to cumulative UV radiation exposure/sun exposure over time - diffuse scaly erythematous macules with underlying dyspigmentation - Recommend daily broad spectrum sunscreen SPF 30+ to sun-exposed areas, reapply every 2 hours as needed.  - Recommend staying in the shade or wearing long sleeves, sun glasses (UVA+UVB protection) and wide brim hats (4-inch brim around the entire circumference of the hat). - Call for new or changing lesions.   History of Sebaceous cell carcinoma R eyelid   Clear. Observe for recurrence.  Call clinic for new or changing lesions.   Recommend regular skin exams, daily broad-spectrum spf  30+ sunscreen use, and photoprotection.   History of Squamous Cell Carcinoma of the Skin - No evidence of recurrence today L pretibia, multiple see hx - Recommend regular full body skin exams - Recommend daily broad spectrum sunscreen SPF 30+ to sun-exposed areas, reapply every 2 hours as needed.  - Call if any new or changing lesions are noted between office visits    Return in about 3 months (around 09/11/2022) for Aks .  I, Marye Round, CMA, am acting as scribe for Brendolyn Patty, MD .   Documentation: I have reviewed the above documentation for accuracy and completeness, and I agree with the above.  Brendolyn Patty MD

## 2022-06-11 NOTE — Patient Instructions (Addendum)
Wound Care Instructions  Cleanse wound gently with soap and water once a day then pat dry with clean gauze. Apply a thin coat of Petrolatum (petroleum jelly, "Vaseline") over the wound (unless you have an allergy to this). We recommend that you use a new, sterile tube of Vaseline. Do not pick or remove scabs. Do not remove the yellow or white "healing tissue" from the base of the wound.  Cover the wound with fresh, clean, nonstick gauze and secure with paper tape. You may use Band-Aids in place of gauze and tape if the wound is small enough, but would recommend trimming much of the tape off as there is often too much. Sometimes Band-Aids can irritate the skin.  You should call the office for your biopsy report after 1 week if you have not already been contacted.  If you experience any problems, such as abnormal amounts of bleeding, swelling, significant bruising, significant pain, or evidence of infection, please call the office immediately.  FOR ADULT SURGERY PATIENTS: If you need something for pain relief you may take 1 extra strength Tylenol (acetaminophen) AND 2 Ibuprofen (200mg each) together every 4 hours as needed for pain. (do not take these if you are allergic to them or if you have a reason you should not take them.) Typically, you may only need pain medication for 1 to 3 days.     Cryotherapy Aftercare  Wash gently with soap and water everyday.   Apply Vaseline and Band-Aid daily until healed.    Due to recent changes in healthcare laws, you may see results of your pathology and/or laboratory studies on MyChart before the doctors have had a chance to review them. We understand that in some cases there may be results that are confusing or concerning to you. Please understand that not all results are received at the same time and often the doctors may need to interpret multiple results in order to provide you with the best plan of care or course of treatment. Therefore, we ask that you  please give us 2 business days to thoroughly review all your results before contacting the office for clarification. Should we see a critical lab result, you will be contacted sooner.   If You Need Anything After Your Visit  If you have any questions or concerns for your doctor, please call our main line at [phone] and press option 4 to reach your doctor's medical assistant. If no one answers, please leave a voicemail as directed and we will return your call as soon as possible. Messages left after 4 pm will be answered the following business day.   You may also send us a message via MyChart. We typically respond to MyChart messages within 1-2 business days.  For prescription refills, please ask your pharmacy to contact our office. Our fax number is [phone].  If you have an urgent issue when the clinic is closed that cannot wait until the next business day, you can page your doctor at the number below.    Please note that while we do our best to be available for urgent issues outside of office hours, we are not available 24/7.   If you have an urgent issue and are unable to reach us, you may choose to seek medical care at your doctor's office, retail clinic, urgent care center, or emergency room.  If you have a medical emergency, please immediately call 911 or go to the emergency department.  Pager Numbers  - Dr. Kowalski: [phone]  -   Dr. Moye: [phone]  - Dr. Stewart: [phone]  In the event of inclement weather, please call our main line at [phone] for an update on the status of any delays or closures.  Dermatology Medication Tips: Please keep the boxes that topical medications come in in order to help keep track of the instructions about where and how to use these. Pharmacies typically print the medication instructions only on the boxes and not directly on the medication tubes.   If your medication is too expensive, please contact our office at  [phone] option 4 or send us a message through MyChart.   We are unable to tell what your co-pay for medications will be in advance as this is different depending on your insurance coverage. However, we may be able to find a substitute medication at lower cost or fill out paperwork to get insurance to cover a needed medication.   If a prior authorization is required to get your medication covered by your insurance company, please allow us 1-2 business days to complete this process.  Drug prices often vary depending on where the prescription is filled and some pharmacies may offer cheaper prices.  The website www.goodrx.com contains coupons for medications through different pharmacies. The prices here do not account for what the cost may be with help from insurance (it may be cheaper with your insurance), but the website can give you the price if you did not use any insurance.  - You can print the associated coupon and take it with your prescription to the pharmacy.  - You may also stop by our office during regular business hours and pick up a GoodRx coupon card.  - If you need your prescription sent electronically to a different pharmacy, notify our office through [facility] MyChart or by phone at [phone] option 4.     Si Usted Necesita Algo Despus de Su Visita  Tambin puede enviarnos un mensaje a travs de MyChart. Por lo general respondemos a los mensajes de MyChart en el transcurso de 1 a 2 das hbiles.  Para renovar recetas, por favor pida a su farmacia que se ponga en contacto con nuestra oficina. Nuestro nmero de fax es el [phone].  Si tiene un asunto urgente cuando la clnica est cerrada y que no puede esperar hasta el siguiente da hbil, puede llamar/localizar a su doctor(a) al nmero que aparece a continuacin.   Por favor, tenga en cuenta que aunque hacemos todo lo posible para estar disponibles para asuntos urgentes fuera del horario de oficina, no estamos  disponibles las 24 horas del da, los 7 das de la semana.   Si tiene un problema urgente y no puede comunicarse con nosotros, puede optar por buscar atencin mdica  en el consultorio de su doctor(a), en una clnica privada, en un centro de atencin urgente o en una sala de emergencias.  Si tiene una emergencia mdica, por favor llame inmediatamente al 911 o vaya a la sala de emergencias.  Nmeros de bper  - Dr. Kowalski: [phone]  - Dra. Moye: [phone]  - Dra. Stewart: [phone]  En caso de inclemencias del tiempo, por favor llame a nuestra lnea principal al [phone] para una actualizacin sobre el estado de cualquier retraso o cierre.  Consejos para la medicacin en dermatologa: Por favor, guarde las cajas en las que vienen los medicamentos de uso tpico para ayudarle a seguir las instrucciones sobre dnde y cmo usarlos. Las farmacias generalmente imprimen las instrucciones del medicamento slo en las cajas y   no directamente en los tubos del medicamento.   Si su medicamento es muy caro, por favor, pngase en contacto con nuestra oficina llamando al [phone] y presione la opcin 4 o envenos un mensaje a travs de MyChart.   No podemos decirle cul ser su copago por los medicamentos por adelantado ya que esto es diferente dependiendo de la cobertura de su seguro. Sin embargo, es posible que podamos encontrar un medicamento sustituto a menor costo o llenar un formulario para que el seguro cubra el medicamento que se considera necesario.   Si se requiere una autorizacin previa para que su compaa de seguros cubra su medicamento, por favor permtanos de 1 a 2 das hbiles para completar este proceso.  Los precios de los medicamentos varan con frecuencia dependiendo del lugar de dnde se surte la receta y alguna farmacias pueden ofrecer precios ms baratos.  El sitio web www.goodrx.com tiene cupones para medicamentos de diferentes farmacias. Los precios aqu no  tienen en cuenta lo que podra costar con la ayuda del seguro (puede ser ms barato con su seguro), pero el sitio web puede darle el precio si no utiliz ningn seguro.  - Puede imprimir el cupn correspondiente y llevarlo con su receta a la farmacia.  - Tambin puede pasar por nuestra oficina durante el horario de atencin regular y recoger una tarjeta de cupones de GoodRx.  - Si necesita que su receta se enve electrnicamente a una farmacia diferente, informe a nuestra oficina a travs de MyChart de [place] o por telfono llamando al [phone] y presione la opcin 4.  

## 2022-06-13 ENCOUNTER — Telehealth: Payer: Self-pay

## 2022-06-13 NOTE — Telephone Encounter (Signed)
-----   Message from Brendolyn Patty, MD sent at 06/13/2022 11:00 AM EDT ----- Skin , left foot dorsum SQUAMOUS CELL CARCINOMA, KERATOACANTHOMA TYPE  SCC skin cancer- already treated with EDC at time of biopsy    - please call patient

## 2022-06-13 NOTE — Telephone Encounter (Signed)
Tried to call patient with biopsy results. No answer and voicemail not set up.

## 2022-06-18 ENCOUNTER — Ambulatory Visit (INDEPENDENT_AMBULATORY_CARE_PROVIDER_SITE_OTHER): Payer: Medicare Other | Admitting: Dermatology

## 2022-06-18 DIAGNOSIS — T1490XD Injury, unspecified, subsequent encounter: Secondary | ICD-10-CM

## 2022-06-18 DIAGNOSIS — C44629 Squamous cell carcinoma of skin of left upper limb, including shoulder: Secondary | ICD-10-CM

## 2022-06-18 DIAGNOSIS — D692 Other nonthrombocytopenic purpura: Secondary | ICD-10-CM | POA: Diagnosis not present

## 2022-06-18 DIAGNOSIS — L578 Other skin changes due to chronic exposure to nonionizing radiation: Secondary | ICD-10-CM

## 2022-06-18 DIAGNOSIS — L821 Other seborrheic keratosis: Secondary | ICD-10-CM

## 2022-06-18 DIAGNOSIS — C4492 Squamous cell carcinoma of skin, unspecified: Secondary | ICD-10-CM

## 2022-06-18 NOTE — Progress Notes (Signed)
   Follow-Up Visit   Subjective  Allison Hampton is a 88 y.o. female who presents for the following: Follow-up.  Patient presents today for recheck of wound of the left pretibia, healing well. Patient is using mupirocin 2% ointment and bandage daily. 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 Area was treated with EDC at time of biopsy. She is continuing wound care daily and area is healing well.   The following portions of the chart were reviewed this encounter and updated as appropriate:       Review of Systems:  No other skin or systemic complaints except as noted in HPI or Assessment and Plan.  Objective  Well appearing patient in no apparent distress; mood and affect are within normal limits.  A focused examination was performed including face, left foot, left leg. Relevant physical exam findings are noted in the Assessment and Plan.  left pretibia Hyperkeratosis with mild violaceous discoloration of the left ankle/lower pretibia; mild crusting.  left foot dorsum Healing ulceration of the left foot dorsum; no evidence of infection.     Assessment & Plan  Actinic Damage - chronic, secondary to cumulative UV radiation exposure/sun exposure over time - diffuse scaly erythematous macules with underlying dyspigmentation - Recommend daily broad spectrum sunscreen SPF 30+ to sun-exposed areas, reapply every 2 hours as needed.  - Recommend staying in the shade or wearing long sleeves, sun glasses (UVA+UVB protection) and wide brim hats (4-inch brim around the entire circumference of the hat). - Call for new or changing lesions.  Purpura - Chronic; persistent and recurrent.  Treatable, but not curable. - Violaceous macules and patches - Benign - Related to trauma, age, sun damage and/or use of blood thinners, chronic use of topical and/or oral steroids - Observe - Can use OTC arnica containing moisturizer such as Dermend Bruise Formula if  desired - Call for worsening or other concerns  Seborrheic Keratoses - Stuck-on, waxy, tan-brown papules and/or plaques, including right lower leg. Discussed cryotherapy if becomes bothersome.   - Benign-appearing - Discussed benign etiology and prognosis. - Observe - Call for any changes  Healing wound left pretibia  Healing wound. Improving  Wound cleansed with Puracyn, followed by mupirocin 2% ointment, telfa, and coban. Continue wound care for 1 more week.   Then recommend moisturizer daily.  Recommend starting moisturizer with exfoliant (Urea, Salicylic acid, or Lactic acid) one to two times daily to help smooth rough and bumpy skin.  OTC options include Cetaphil Rough and Bumpy lotion (Urea), Eucerin Roughness Relief lotion or spot treatment cream (Urea), CeraVe SA lotion/cream for Rough and Bumpy skin (Sal Acid), Gold Bond Rough and Bumpy cream (Sal Acid), and AmLactin 12% lotion/cream (Lactic Acid).  If applying in morning, also apply sunscreen to sun-exposed areas, since these exfoliating moisturizers can increase sensitivity to sun.     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   Continue mupirocin 2% ointment and cover daily until healed.    Return as scheduled.  IJamesetta Orleans, CMA, am acting as scribe for Brendolyn Patty, MD .  Documentation: I have reviewed the above documentation for accuracy and completeness, and I agree with the above.  Brendolyn Patty MD

## 2022-06-18 NOTE — Patient Instructions (Addendum)
Wash left lower leg with mild soap and water, then apply rough and bumpy moisturizer daily, may apply sock over. May continue mupirocin 2% ointment and cover for another week.   Recommend starting moisturizer with exfoliant (Urea, Salicylic acid, or Lactic acid) one to two times daily to help smooth rough and bumpy skin.  OTC options include Cetaphil Rough and Bumpy lotion (Urea), Eucerin Roughness Relief lotion or spot treatment cream (Urea), CeraVe SA lotion/cream for Rough and Bumpy skin (Sal Acid), Gold Bond Rough and Bumpy cream (Sal Acid), and AmLactin 12% lotion/cream (Lactic Acid).  If applying in morning, also apply sunscreen to sun-exposed areas, since these exfoliating moisturizers can increase sensitivity to sun.   Wound Care Instructions  Cleanse wound gently with soap and water once a day then pat dry with clean gauze. Apply a thin coat of Petrolatum (petroleum jelly, "Vaseline") over the wound (unless you have an allergy to this). We recommend that you use a new, sterile tube of Vaseline. Do not pick or remove scabs. Do not remove the yellow or white "healing tissue" from the base of the wound.  Cover the wound with fresh, clean, nonstick gauze and secure with paper tape. You may use Band-Aids in place of gauze and tape if the wound is small enough, but would recommend trimming much of the tape off as there is often too much. Sometimes Band-Aids can irritate the skin.  If you experience any problems, such as abnormal amounts of bleeding, swelling, significant bruising, significant pain, or evidence of infection, please call the office immediately.  FOR ADULT SURGERY PATIENTS: If you need something for pain relief you may take 1 extra strength Tylenol (acetaminophen) AND 2 Ibuprofen ('200mg'$  each) together every 4 hours as needed for pain. (do not take these if you are allergic to them or if you have a reason you should not take them.) Typically, you may only need pain medication for 1 to 3  days.      Due to recent changes in healthcare laws, you may see results of your pathology and/or laboratory studies on MyChart before the doctors have had a chance to review them. We understand that in some cases there may be results that are confusing or concerning to you. Please understand that not all results are received at the same time and often the doctors may need to interpret multiple results in order to provide you with the best plan of care or course of treatment. Therefore, we ask that you please give [place] 2 business days to thoroughly review all your results before contacting the office for clarification. Should we see a critical lab result, you will be contacted sooner.   If You Need Anything After Your Visit  If you have any questions or concerns for your doctor, please call our main line at [phone] and press option 4 to reach your doctor's medical assistant. If no one answers, please leave a voicemail as directed and we will return your call as soon as possible. Messages left after 4 pm will be answered the following business day.   You may also send [place] a message via [place]. We typically respond to MyChart messages within 1-2 business days.  For prescription refills, please ask your pharmacy to contact our office. Our fax number is [phone].  If you have an urgent issue when the clinic is closed that cannot wait until the next business day, you can page your doctor at the number below.    Please note that  while we do our best to be available for urgent issues outside of office hours, we are not available 24/7.   If you have an urgent issue and are unable to reach [place], you may choose to seek medical care at your doctor's office, retail clinic, urgent care center, or emergency room.  If you have a medical emergency, please immediately call 911 or go to the emergency department.  Pager Numbers  - Dr. Nehemiah Massed: [phone]  - Dr. Laurence Ferrari: [phone]  - Dr. Nicole Kindred:  [phone]  In the event of inclement weather, please call our main line at [phone] for an update on the status of any delays or closures.  Dermatology Medication Tips: Please keep the boxes that topical medications come in in order to help keep track of the instructions about where and how to use these. Pharmacies typically print the medication instructions only on the boxes and not directly on the medication tubes.   If your medication is too expensive, please contact our office at [phone] option 4 or send [place] a message through [place].   We are unable to tell what your co-pay for medications will be in advance as this is different depending on your insurance coverage. However, we may be able to find a substitute medication at lower cost or fill out paperwork to get insurance to cover a needed medication.   If a prior authorization is required to get your medication covered by your insurance company, please allow [place] 1-2 business days to complete this process.  Drug prices often vary depending on where the prescription is filled and some pharmacies may offer cheaper prices.  The website www.goodrx.com contains coupons for medications through different pharmacies. The prices here do not account for what the cost may be with help from insurance (it may be cheaper with your insurance), but the website can give you the price if you did not use any insurance.  - You can print the associated coupon and take it with your prescription to the pharmacy.  - You may also stop by our office during regular business hours and pick up a GoodRx coupon card.  - If you need your prescription sent electronically to a different pharmacy, notify our office through [facility] or by phone at [phone] option 4.     Si Usted Necesita Algo Despus de Su Visita  Tambin puede enviarnos un mensaje a travs de Pharmacist, community. Por lo general respondemos a los mensajes de MyChart en el transcurso de 1 a 2  das hbiles.  Para renovar recetas, por favor pida a su farmacia que se ponga en contacto con nuestra oficina. Harland Dingwall de fax es [place] [phone].  Si tiene un asunto urgente cuando la clnica est cerrada y que no puede esperar hasta el siguiente da hbil, puede llamar/localizar a su doctor(a) al nmero que aparece a continuacin.   Por favor, tenga en cuenta que aunque hacemos todo lo posible para estar disponibles para asuntos urgentes fuera del horario de [place], no estamos disponibles las 24 horas del da, los 7 das de la [place].   Si tiene un problema urgente y no puede comunicarse con nosotros, puede optar por buscar atencin mdica  en el consultorio de su doctor(a), en una clnica privada, en un centro de atencin urgente o en una sala de emergencias.  Si tiene Engineering geologist, por favor llame inmediatamente al 911 o vaya a la sala de emergencias.  Nmeros de bper  - Dr. Nehemiah Massed: [phone]  -  DraLaurence Ferrari: [phone]  - Dra. Nicole Kindred: [phone]  En caso de inclemencias del [place], por favor llame a Johnsie Kindred principal al [phone] para una actualizacin sobre el [place] de cualquier retraso o cierre.  Consejos para la medicacin en dermatologa: Por favor, guarde las cajas en las que vienen los medicamentos de uso tpico para ayudarle a seguir las instrucciones sobre dnde y cmo usarlos. Las farmacias generalmente imprimen las instrucciones del medicamento slo en las cajas y no directamente en los tubos del [place].   Si su medicamento es muy caro, por favor, pngase en contacto con Zigmund Daniel llamando al [phone] y presione la opcin 4 o envenos un mensaje a travs de Pharmacist, community.   No podemos decirle cul ser su copago por los medicamentos por adelantado ya que esto es diferente dependiendo de la cobertura de su seguro. Sin embargo, es posible que podamos encontrar un medicamento sustituto a Electrical engineer un formulario para que el  seguro cubra el medicamento que se considera necesario.   Si se requiere una autorizacin previa para que su compaa de seguros [place] su medicamento, por favor permtanos de 1 a 2 das hbiles para completar este proceso.  Los precios de los medicamentos varan con frecuencia dependiendo del Environmental consultant de dnde se surte la receta y alguna farmacias pueden ofrecer precios ms baratos.  El sitio web www.goodrx.com tiene cupones para medicamentos de Airline pilot. Los precios aqu no tienen en cuenta lo que podra costar con la ayuda del seguro (puede ser ms barato con su seguro), pero el sitio web puede darle el precio si no utiliz Research scientist (physical sciences).  - Puede imprimir el cupn correspondiente y llevarlo con su receta a la farmacia.  - Tambin puede pasar por nuestra oficina durante el horario de atencin regular y Charity fundraiser una tarjeta de cupones de GoodRx.  - Si necesita que su receta se enve electrnicamente a una farmacia diferente, informe a nuestra oficina a travs de MyChart de [place] o por telfono llamando al [phone] y presione la opcin 4.

## 2022-07-31 ENCOUNTER — Ambulatory Visit (INDEPENDENT_AMBULATORY_CARE_PROVIDER_SITE_OTHER): Payer: Medicare Other | Admitting: Dermatology

## 2022-07-31 VITALS — BP 138/73 | HR 61

## 2022-07-31 DIAGNOSIS — L578 Other skin changes due to chronic exposure to nonionizing radiation: Secondary | ICD-10-CM

## 2022-07-31 DIAGNOSIS — C44729 Squamous cell carcinoma of skin of left lower limb, including hip: Secondary | ICD-10-CM

## 2022-07-31 DIAGNOSIS — D485 Neoplasm of uncertain behavior of skin: Secondary | ICD-10-CM

## 2022-07-31 NOTE — Patient Instructions (Addendum)
Wound Care Instructions  Cleanse wound gently with soap and water once a day then pat dry with clean gauze. Apply a thin coat of Petrolatum (petroleum jelly, "Vaseline") over the wound (unless you have an allergy to this). We recommend that you use a new, sterile tube of Vaseline. Do not pick or remove scabs. Do not remove the yellow or white "healing tissue" from the base of the wound.  Cover the wound with fresh, clean, nonstick gauze and secure with paper tape. You may use Band-Aids in place of gauze and tape if the wound is small enough, but would recommend trimming much of the tape off as there is often too much. Sometimes Band-Aids can irritate the skin.  You should call the office for your biopsy report after 1 week if you have not already been contacted.  If you experience any problems, such as abnormal amounts of bleeding, swelling, significant bruising, significant pain, or evidence of infection, please call the office immediately.  FOR ADULT SURGERY PATIENTS: If you need something for pain relief you may take 1 extra strength Tylenol (acetaminophen) AND 2 Ibuprofen (200mg each) together every 4 hours as needed for pain. (do not take these if you are allergic to them or if you have a reason you should not take them.) Typically, you may only need pain medication for 1 to 3 days.     Due to recent changes in healthcare laws, you may see results of your pathology and/or laboratory studies on MyChart before the doctors have had a chance to review them. We understand that in some cases there may be results that are confusing or concerning to you. Please understand that not all results are received at the same time and often the doctors may need to interpret multiple results in order to provide you with the best plan of care or course of treatment. Therefore, we ask that you please give us 2 business days to thoroughly review all your results before contacting the office for clarification. Should  we see a critical lab result, you will be contacted sooner.   If You Need Anything After Your Visit  If you have any questions or concerns for your doctor, please call our main line at [phone] and press option 4 to reach your doctor's medical assistant. If no one answers, please leave a voicemail as directed and we will return your call as soon as possible. Messages left after 4 pm will be answered the following business day.   You may also send us a message via MyChart. We typically respond to MyChart messages within 1-2 business days.  For prescription refills, please ask your pharmacy to contact our office. Our fax number is [phone].  If you have an urgent issue when the clinic is closed that cannot wait until the next business day, you can page your doctor at the number below.    Please note that while we do our best to be available for urgent issues outside of office hours, we are not available 24/7.   If you have an urgent issue and are unable to reach us, you may choose to seek medical care at your doctor's office, retail clinic, urgent care center, or emergency room.  If you have a medical emergency, please immediately call 911 or go to the emergency department.  Pager Numbers  - Dr. Kowalski: [phone]  - Dr. Moye: [phone]  - Dr. Stewart: [phone]  In the event of inclement weather, please call our main line at   [phone] for an update on the status of any delays or closures.  Dermatology Medication Tips: Please keep the boxes that topical medications come in in order to help keep track of the instructions about where and how to use these. Pharmacies typically print the medication instructions only on the boxes and not directly on the medication tubes.   If your medication is too expensive, please contact our office at [phone] option 4 or send us a message through MyChart.   We are unable to tell what your co-pay for medications will be in  advance as this is different depending on your insurance coverage. However, we may be able to find a substitute medication at lower cost or fill out paperwork to get insurance to cover a needed medication.   If a prior authorization is required to get your medication covered by your insurance company, please allow us 1-2 business days to complete this process.  Drug prices often vary depending on where the prescription is filled and some pharmacies may offer cheaper prices.  The website www.goodrx.com contains coupons for medications through different pharmacies. The prices here do not account for what the cost may be with help from insurance (it may be cheaper with your insurance), but the website can give you the price if you did not use any insurance.  - You can print the associated coupon and take it with your prescription to the pharmacy.  - You may also stop by our office during regular business hours and pick up a GoodRx coupon card.  - If you need your prescription sent electronically to a different pharmacy, notify our office through [place] MyChart or by phone at [phone] option 4.     Si Usted Necesita Algo Despus de Su Visita  Tambin puede enviarnos un mensaje a travs de MyChart. Por lo general respondemos a los mensajes de MyChart en el transcurso de 1 a 2 das hbiles.  Para renovar recetas, por favor pida a su farmacia que se ponga en contacto con nuestra oficina. Nuestro nmero de fax es el [phone].  Si tiene un asunto urgente cuando la clnica est cerrada y que no puede esperar hasta el siguiente da hbil, puede llamar/localizar a su doctor(a) al nmero que aparece a continuacin.   Por favor, tenga en cuenta que aunque hacemos todo lo posible para estar disponibles para asuntos urgentes fuera del horario de oficina, no estamos disponibles las 24 horas del da, los 7 das de la semana.   Si tiene un problema urgente y no puede comunicarse con nosotros, puede  optar por buscar atencin mdica  en el consultorio de su doctor(a), en una clnica privada, en un centro de atencin urgente o en una sala de emergencias.  Si tiene una emergencia mdica, por favor llame inmediatamente al 911 o vaya a la sala de emergencias.  Nmeros de bper  - Dr. Kowalski: [phone]  - Dra. Moye: [phone]  - Dra. Stewart: [phone]  En caso de inclemencias del tiempo, por favor llame a nuestra lnea principal al [phone] para una actualizacin sobre el estado de cualquier retraso o cierre.  Consejos para la medicacin en dermatologa: Por favor, guarde las cajas en las que vienen los medicamentos de uso tpico para ayudarle a seguir las instrucciones sobre dnde y cmo usarlos. Las farmacias generalmente imprimen las instrucciones del medicamento slo en las cajas y no directamente en los tubos del medicamento.   Si su medicamento es muy caro, por favor, pngase en contacto con   nuestra oficina llamando al [phone] y presione la opcin 4 o envenos un mensaje a travs de MyChart.   No podemos decirle cul ser su copago por los medicamentos por adelantado ya que esto es diferente dependiendo de la cobertura de su seguro. Sin embargo, es posible que podamos encontrar un medicamento sustituto a menor costo o llenar un formulario para que el seguro cubra el medicamento que se considera necesario.   Si se requiere una autorizacin previa para que su compaa de seguros cubra su medicamento, por favor permtanos de 1 a 2 das hbiles para completar este proceso.  Los precios de los medicamentos varan con frecuencia dependiendo del lugar de dnde se surte la receta y alguna farmacias pueden ofrecer precios ms baratos.  El sitio web www.goodrx.com tiene cupones para medicamentos de diferentes farmacias. Los precios aqu no tienen en cuenta lo que podra costar con la ayuda del seguro (puede ser ms barato con su seguro), pero el sitio web puede darle el  precio si no utiliz ningn seguro.  - Puede imprimir el cupn correspondiente y llevarlo con su receta a la farmacia.  - Tambin puede pasar por nuestra oficina durante el horario de atencin regular y recoger una tarjeta de cupones de GoodRx.  - Si necesita que su receta se enve electrnicamente a una farmacia diferente, informe a nuestra oficina a travs de MyChart de [place] o por telfono llamando al [phone] y presione la opcin 4.  

## 2022-07-31 NOTE — Progress Notes (Signed)
   Follow-Up Visit   Subjective  Allison Hampton is a 88 y.o. female who presents for the following: Recheck SCC site (Left foot dorsum, treated with [facility] 06/11/2022. Area is still sore and tender.).   The following portions of the chart were reviewed this encounter and updated as appropriate:       Review of Systems:  No other skin or systemic complaints except as noted in HPI or Assessment and Plan.  Objective  Well appearing patient in no apparent distress; mood and affect are within normal limits.  A focused examination was performed including feet, legs. Relevant physical exam findings are noted in the Assessment and Plan.  Left Foot Dorsum 1.5 CM keratotic nodule         Assessment & Plan  Neoplasm of uncertain behavior of skin Left Foot Dorsum  Skin / nail biopsy Type of biopsy: tangential   Informed consent: discussed and consent obtained   Patient was prepped and draped in usual sterile fashion: Area prepped with alcohol. Anesthesia: the lesion was anesthetized in a standard fashion   Anesthetic:  1% lidocaine w/ epinephrine 1-100,000 buffered w/ 8.4% NaHCO3 Instrument used: flexible razor blade   Hemostasis achieved with: pressure, aluminum chloride and electrodesiccation   Outcome: patient tolerated procedure well   Post-procedure details: wound care instructions given   Post-procedure details comment:  Ointment and small bandage applied  Specimen 1 - Surgical pathology Differential Diagnosis: Recurrent SCC vs other Check Margins: No Previous Accession # [id number]  Treated with EDC 06/11/2022. If positive for recurrent SCC, will need excision. Recommend Mohs with Dr Manley Mason.  Actinic Damage - chronic, secondary to cumulative UV radiation exposure/sun exposure over time - diffuse scaly erythematous macules with underlying dyspigmentation - Recommend daily broad spectrum sunscreen SPF 30+ to sun-exposed areas, reapply every 2 hours as needed.  -  Recommend staying in the shade or wearing long sleeves, sun glasses (UVA+UVB protection) and wide brim hats (4-inch brim around the entire circumference of the hat). - Call for new or changing lesions.   Return as scheduled.  Documentation: I have reviewed the above documentation for accuracy and completeness, and I agree with the above.  Brendolyn Patty MD

## 2022-08-08 ENCOUNTER — Telehealth: Payer: Self-pay

## 2022-08-08 ENCOUNTER — Other Ambulatory Visit: Payer: Self-pay

## 2022-08-08 DIAGNOSIS — C4492 Squamous cell carcinoma of skin, unspecified: Secondary | ICD-10-CM

## 2022-08-08 NOTE — Telephone Encounter (Signed)
-----   Message from Brendolyn Patty, MD sent at 08/07/2022  6:07 PM EST ----- Skin , left foot dorsum RESIDUAL SQUAMOUS CELL CARCINOMA, DEEP MARGIN INVOLVED  SCC skin cancer, recurrent- Pt needs Mohs with Dr. Manley Mason at [facility] - please call patient

## 2022-08-08 NOTE — Telephone Encounter (Signed)
Patient's daughter advised pathology results showed recurrent SCC at left foot dorsum and referral to [facility] has been sent. Lurlean Horns., RMA

## 2022-08-08 NOTE — Telephone Encounter (Signed)
-----   Message from Brendolyn Patty, MD sent at 08/07/2022  6:07 PM EST ----- Skin , left foot dorsum RESIDUAL SQUAMOUS CELL CARCINOMA, DEEP MARGIN INVOLVED  SCC skin cancer, recurrent- Pt needs Mohs with Dr. Manley Mason at [facility] - please call patient

## 2022-08-08 NOTE — Telephone Encounter (Signed)
Left pt msg to call for bx results.  Referral to Dr. Manley Mason has been sent./sh

## 2022-08-11 ENCOUNTER — Observation Stay: Payer: Medicare Other

## 2022-08-11 ENCOUNTER — Emergency Department: Payer: Medicare Other

## 2022-08-11 ENCOUNTER — Other Ambulatory Visit: Payer: Self-pay

## 2022-08-11 ENCOUNTER — Inpatient Hospital Stay
Admission: EM | Admit: 2022-08-11 | Discharge: 2022-08-13 | DRG: 065 | Disposition: A | Discharge status: Home Health | Payer: Medicare Other | Attending: Internal Medicine | Admitting: Internal Medicine

## 2022-08-11 DIAGNOSIS — I77810 Thoracic aortic ectasia: Secondary | ICD-10-CM | POA: Diagnosis present

## 2022-08-11 DIAGNOSIS — Z8711 Personal history of peptic ulcer disease: Secondary | ICD-10-CM

## 2022-08-11 DIAGNOSIS — G8194 Hemiplegia, unspecified affecting left nondominant side: Secondary | ICD-10-CM | POA: Diagnosis present

## 2022-08-11 DIAGNOSIS — I77819 Aortic ectasia, unspecified site: Secondary | ICD-10-CM

## 2022-08-11 DIAGNOSIS — Z66 Do not resuscitate: Secondary | ICD-10-CM | POA: Diagnosis present

## 2022-08-11 DIAGNOSIS — Z91041 Radiographic dye allergy status: Secondary | ICD-10-CM

## 2022-08-11 DIAGNOSIS — J449 Chronic obstructive pulmonary disease, unspecified: Secondary | ICD-10-CM | POA: Diagnosis present

## 2022-08-11 DIAGNOSIS — I639 Cerebral infarction, unspecified: Secondary | ICD-10-CM | POA: Diagnosis present

## 2022-08-11 DIAGNOSIS — Z85828 Personal history of other malignant neoplasm of skin: Secondary | ICD-10-CM

## 2022-08-11 DIAGNOSIS — Z881 Allergy status to other antibiotic agents status: Secondary | ICD-10-CM

## 2022-08-11 DIAGNOSIS — F17210 Nicotine dependence, cigarettes, uncomplicated: Secondary | ICD-10-CM | POA: Diagnosis present

## 2022-08-11 DIAGNOSIS — Z885 Allergy status to narcotic agent status: Secondary | ICD-10-CM

## 2022-08-11 DIAGNOSIS — I5022 Chronic systolic (congestive) heart failure: Secondary | ICD-10-CM | POA: Diagnosis present

## 2022-08-11 DIAGNOSIS — R931 Abnormal findings on diagnostic imaging of heart and coronary circulation: Secondary | ICD-10-CM | POA: Diagnosis present

## 2022-08-11 DIAGNOSIS — G8191 Hemiplegia, unspecified affecting right dominant side: Secondary | ICD-10-CM | POA: Diagnosis present

## 2022-08-11 DIAGNOSIS — E785 Hyperlipidemia, unspecified: Secondary | ICD-10-CM | POA: Diagnosis present

## 2022-08-11 DIAGNOSIS — I6329 Cerebral infarction due to unspecified occlusion or stenosis of other precerebral arteries: Secondary | ICD-10-CM | POA: Diagnosis not present

## 2022-08-11 DIAGNOSIS — Z9049 Acquired absence of other specified parts of digestive tract: Secondary | ICD-10-CM

## 2022-08-11 DIAGNOSIS — F419 Anxiety disorder, unspecified: Secondary | ICD-10-CM | POA: Diagnosis present

## 2022-08-11 DIAGNOSIS — K219 Gastro-esophageal reflux disease without esophagitis: Secondary | ICD-10-CM | POA: Diagnosis present

## 2022-08-11 DIAGNOSIS — Z8719 Personal history of other diseases of the digestive system: Secondary | ICD-10-CM

## 2022-08-11 DIAGNOSIS — I110 Hypertensive heart disease with heart failure: Secondary | ICD-10-CM | POA: Diagnosis present

## 2022-08-11 DIAGNOSIS — Z882 Allergy status to sulfonamides status: Secondary | ICD-10-CM

## 2022-08-11 DIAGNOSIS — Z91013 Allergy to seafood: Secondary | ICD-10-CM

## 2022-08-11 DIAGNOSIS — Z8619 Personal history of other infectious and parasitic diseases: Secondary | ICD-10-CM

## 2022-08-11 DIAGNOSIS — G459 Transient cerebral ischemic attack, unspecified: Secondary | ICD-10-CM | POA: Diagnosis present

## 2022-08-11 DIAGNOSIS — I340 Nonrheumatic mitral (valve) insufficiency: Secondary | ICD-10-CM | POA: Diagnosis present

## 2022-08-11 DIAGNOSIS — Z7982 Long term (current) use of aspirin: Secondary | ICD-10-CM

## 2022-08-11 DIAGNOSIS — Z888 Allergy status to other drugs, medicaments and biological substances status: Secondary | ICD-10-CM

## 2022-08-11 DIAGNOSIS — E871 Hypo-osmolality and hyponatremia: Secondary | ICD-10-CM | POA: Diagnosis present

## 2022-08-11 DIAGNOSIS — Z880 Allergy status to penicillin: Secondary | ICD-10-CM

## 2022-08-11 DIAGNOSIS — L570 Actinic keratosis: Secondary | ICD-10-CM | POA: Diagnosis present

## 2022-08-11 DIAGNOSIS — R29701 NIHSS score 1: Secondary | ICD-10-CM | POA: Diagnosis present

## 2022-08-11 DIAGNOSIS — N952 Postmenopausal atrophic vaginitis: Secondary | ICD-10-CM | POA: Diagnosis present

## 2022-08-11 DIAGNOSIS — Z79899 Other long term (current) drug therapy: Secondary | ICD-10-CM

## 2022-08-11 DIAGNOSIS — R499 Unspecified voice and resonance disorder: Secondary | ICD-10-CM | POA: Diagnosis present

## 2022-08-11 DIAGNOSIS — Z8249 Family history of ischemic heart disease and other diseases of the circulatory system: Secondary | ICD-10-CM

## 2022-08-11 DIAGNOSIS — R471 Dysarthria and anarthria: Secondary | ICD-10-CM

## 2022-08-11 DIAGNOSIS — I10 Essential (primary) hypertension: Secondary | ICD-10-CM | POA: Diagnosis present

## 2022-08-11 DIAGNOSIS — R29810 Facial weakness: Secondary | ICD-10-CM | POA: Diagnosis present

## 2022-08-11 LAB — CBC
HCT: 42.1 % (ref 36.0–46.0)
Hemoglobin: 13.9 g/dL (ref 12.0–15.0)
MCH: 31.7 pg (ref 26.0–34.0)
MCHC: 33 g/dL (ref 30.0–36.0)
MCV: 95.9 fL (ref 80.0–100.0)
Platelets: 220 10*3/uL (ref 150–400)
RBC: 4.39 MIL/uL (ref 3.87–5.11)
RDW: 11.8 % (ref 11.5–15.5)
WBC: 5.5 10*3/uL (ref 4.0–10.5)
nRBC: 0 % (ref 0.0–0.2)

## 2022-08-11 LAB — LIPID PANEL
Cholesterol: 183 mg/dL (ref 0–200)
HDL: 43 mg/dL (ref >40)
LDL Cholesterol: 111 mg/dL — ABNORMAL HIGH (ref 0–99)
Total CHOL/HDL Ratio: 4.3 RATIO
Triglycerides: 143 mg/dL (ref <150)
VLDL: 29 mg/dL (ref 0–40)

## 2022-08-11 LAB — BASIC METABOLIC PANEL
Anion gap: 8 (ref 5–15)
BUN: 11 mg/dL (ref 8–23)
CO2: 24 mmol/L (ref 22–32)
Calcium: 8.7 mg/dL — ABNORMAL LOW (ref 8.9–10.3)
Chloride: 97 mmol/L — ABNORMAL LOW (ref 98–111)
Creatinine, Ser: 0.49 mg/dL (ref 0.44–1.00)
GFR, Estimated: >60 mL/min (ref >60)
Glucose, Bld: 133 mg/dL — ABNORMAL HIGH (ref 70–99)
Potassium: 3.8 mmol/L (ref 3.5–5.1)
Sodium: 129 mmol/L — ABNORMAL LOW (ref 135–145)

## 2022-08-11 LAB — TROPONIN I (HIGH SENSITIVITY)
Troponin I (High Sensitivity): 5 ng/L (ref <18)
Troponin I (High Sensitivity): 7 ng/L (ref <18)

## 2022-08-11 MED ORDER — STROKE: EARLY STAGES OF RECOVERY BOOK: Freq: Once | Status: AC

## 2022-08-11 MED ORDER — LORAZEPAM 1 MG PO TABS
1.0000 mg | ORAL_TABLET | Freq: Once | ORAL | Status: AC
  Administered 2022-08-11: 1 mg via ORAL
  Filled 2022-08-11: qty 1

## 2022-08-11 MED ORDER — LORATADINE 10 MG PO TABS
10.0000 mg | ORAL_TABLET | Freq: Every day | ORAL | Status: DC
  Administered 2022-08-11 – 2022-08-13 (×3): 10 mg via ORAL
  Filled 2022-08-11 (×3): qty 1

## 2022-08-11 MED ORDER — ASPIRIN 81 MG PO CHEW
81.0000 mg | CHEWABLE_TABLET | Freq: Every day | ORAL | Status: DC
  Administered 2022-08-12 – 2022-08-13 (×2): 81 mg via ORAL
  Filled 2022-08-11 (×2): qty 1

## 2022-08-11 MED ORDER — ENOXAPARIN SODIUM 40 MG/0.4ML IJ SOSY
40.0000 mg | PREFILLED_SYRINGE | INTRAMUSCULAR | Status: DC
  Administered 2022-08-11: 40 mg via SUBCUTANEOUS

## 2022-08-11 MED ORDER — LATANOPROST 0.005 % OP SOLN
1.0000 [drp] | Freq: Every day | OPHTHALMIC | Status: DC
  Administered 2022-08-12: 1 [drp] via OPHTHALMIC
  Filled 2022-08-11 (×2): qty 2.5

## 2022-08-11 MED ORDER — ACETAMINOPHEN 650 MG RE SUPP: 650.0000 mg | RECTAL | Status: DC | PRN

## 2022-08-11 MED ORDER — DIAZEPAM 5 MG PO TABS: 2.5000 mg | ORAL_TABLET | Freq: Every day | ORAL | Status: DC | PRN

## 2022-08-11 MED ORDER — ACETAMINOPHEN 325 MG PO TABS: 650.0000 mg | ORAL_TABLET | ORAL | Status: DC | PRN

## 2022-08-11 MED ORDER — ACETAMINOPHEN 160 MG/5ML PO SOLN: 650.0000 mg | ORAL | Status: DC | PRN

## 2022-08-11 MED ORDER — PANTOPRAZOLE SODIUM 40 MG PO TBEC
40.0000 mg | DELAYED_RELEASE_TABLET | Freq: Every day | ORAL | Status: DC
  Administered 2022-08-12 – 2022-08-13 (×2): 40 mg via ORAL
  Filled 2022-08-11 (×3): qty 1

## 2022-08-11 MED ORDER — ROSUVASTATIN CALCIUM 20 MG PO TABS
20.0000 mg | ORAL_TABLET | Freq: Every day | ORAL | Status: DC
  Administered 2022-08-11 – 2022-08-13 (×3): 20 mg via ORAL
  Filled 2022-08-11 (×5): qty 1

## 2022-08-11 MED ORDER — ASPIRIN 325 MG PO TABS
325.0000 mg | ORAL_TABLET | Freq: Once | ORAL | Status: AC
  Administered 2022-08-11: 325 mg via ORAL
  Filled 2022-08-11: qty 1

## 2022-08-11 MED ORDER — ALBUTEROL SULFATE (2.5 MG/3ML) 0.083% IN NEBU: 3.0000 mL | INHALATION_SOLUTION | Freq: Four times a day (QID) | RESPIRATORY_TRACT | Status: DC | PRN

## 2022-08-11 MED ORDER — FERROUS SULFATE 325 (65 FE) MG PO TABS
325.0000 mg | ORAL_TABLET | Freq: Every day | ORAL | Status: DC
  Administered 2022-08-12 – 2022-08-13 (×2): 325 mg via ORAL
  Filled 2022-08-11 (×2): qty 1

## 2022-08-11 MED ORDER — SENNOSIDES-DOCUSATE SODIUM 8.6-50 MG PO TABS: 1.0000 | ORAL_TABLET | Freq: Every evening | ORAL | Status: DC | PRN

## 2022-08-11 MED ORDER — SODIUM CHLORIDE 0.9 % IV SOLN: INTRAVENOUS | Status: AC

## 2022-08-11 NOTE — ED Provider Triage Note (Signed)
Emergency Medicine Provider Triage Evaluation Note  Allison Hampton , a 88 y.o. female  was evaluated in triage.  Pt complains of left shoulder and chest pain.  History of by aortic aneurysm, also having some slurred speech per family member.  Last known well was yesterday.  Review of Systems  Positive:  Negative:   Physical Exam  BP (!) 156/74   Pulse 69   Temp 98.4 F (36.9 C) (Oral)   Resp 18   SpO2 93%  Gen:   Awake, no distress   Resp:  Normal effort  MSK:   Moves extremities without difficulty  Other:    Medical Decision Making  Medically screening exam initiated at 11:21 AM.  Appropriate orders placed.  Allison Hampton was informed that the remainder of the evaluation will be completed by another provider, this initial triage assessment does not replace that evaluation, and the importance of remaining in the ED until their evaluation is complete.     Versie Starks, PA-C 08/11/22 1121

## 2022-08-11 NOTE — ED Triage Notes (Signed)
Pt to ED via [facility] from [facility]. Pt reports weakness and stabbing pain in left shoulder and arm since 0300. Daughter came to home see her at 1000 and called EMS. Pt denies blood thinners. Pt has known aneurysm.

## 2022-08-11 NOTE — Assessment & Plan Note (Addendum)
Patient presenting with bilateral lower extremity weakness, right hand weakness and change in voice.  Continues to have mild right upper extremity weakness. MRI brain with left pontine stroke.  Neurology was consulted. MRA shows no flow signal is seen in the left vertebral artery from his origin through the distal V4 segment, Echocardiogram with normal EF, grade 1 diastolic dysfunction and severe mitral regurgitation. PT/OT recommending home health. Lipid panel with LDL of 111 -Continue aspirin and statin

## 2022-08-11 NOTE — Assessment & Plan Note (Addendum)
Per chart review, patient had a stress echo in 2005 that demonstrated left ejection fraction of 45% with anterior apical ischemia.  I am unable to find echocardiogram report or any subsequent follow-up. Otherwise, no diagnosis of CHF. She appears euvolemic on examination. -Repeat echocardiogram with normal EF and grade 1 diastolic dysfunction

## 2022-08-11 NOTE — ED Provider Notes (Signed)
[facility] Provider Note    Event Date/Time   First MD Initiated Contact with Patient 08/11/22 1246     (approximate)   History   Chief Complaint: Weakness   HPI  Allison Hampton is a 88 y.o. female with a history of GERD, hypertension, COPD who comes the ED complaining of heaviness in bilateral legs since 3:00 AM today.  Also noticed that her balance is off and she had to use a cane and hold onto the wall to get safely from her bedroom across the house, and she normally does not use a cane or have any balance issues.  Also complains of some change in her speech.  None of this is normal and is all new since 3 AM.  Patient denies any falls or trauma.  No neck pain or fever     Physical Exam   Triage Vital Signs: ED Triage Vitals [08/11/22 1118]  Enc Vitals Group     BP (!) 156/74     Pulse Rate 69     Resp 18     Temp 98.4 F (36.9 C)     Temp Source Oral     SpO2 93 %     Weight      Height      Head Circumference      Peak Flow      Pain Score 4     Pain Loc      Pain Edu?      Excl. in [place]?     Most recent vital signs: Vitals:   08/11/22 1118 08/11/22 1446  BP: (!) 156/74 (!) 140/70  Pulse: 69 (!) 58  Resp: 18 16  Temp: 98.4 F (36.9 C)   SpO2: 93% 96%    General: Awake, no distress.  CV:  Good peripheral perfusion.  Regular rate rhythm Resp:  Normal effort.  Clear to auscultation bilaterally Abd:  No distention.  Other:  Moving all extremities.  Symmetric motor function bilaterally.  She does exhibit dysarthria as well as some inappropriate word substitutions.   ED Results / Procedures / Treatments   Labs (all labs ordered are listed, but only abnormal results are displayed) Labs Reviewed  BASIC METABOLIC PANEL - Abnormal; Notable for the following components:      Result Value   Sodium 129 (*)    Chloride 97 (*)    Glucose, Bld 133 (*)    Calcium 8.7 (*)    All other components within normal limits  CBC   TROPONIN I (HIGH SENSITIVITY)  TROPONIN I (HIGH SENSITIVITY)     EKG Interpreted by me Sinus rhythm rate of 68.  Normal axis and intervals.  Normal QRS ST segments and T waves.  1 PVC on the strip.   RADIOLOGY CT head interpreted by me, appears unremarkable.  Radiology report reviewed.  MRI/MRA brain pending   PROCEDURES:  Procedures   MEDICATIONS ORDERED IN ED: Medications  LORazepam (ATIVAN) tablet 1 mg (1 mg Oral Given 08/11/22 1335)     IMPRESSION / MDM / ASSESSMENT AND PLAN / ED COURSE  I reviewed the triage vital signs and the nursing notes.                              Differential diagnosis includes, but is not limited to, intracranial hemorrhage, brain mass, ischemic stroke, dehydration, electrolyte abnormality, AKI, lung mass  Patient's presentation is most consistent with acute  presentation with potential threat to life or bodily function.  Pt p/w onset of word dysarthria, change in balance, and some word finding difficulty since 03:00 today. Not a candidate for TNK or endovascular intervention. CT negative. Labs unremarkable.  Will plan to hospitalize for further stroke workup. MRI ordered.       FINAL CLINICAL IMPRESSION(S) / ED DIAGNOSES   Final diagnoses:  Dysarthria     Rx / DC Orders   ED Discharge Orders     None        Note:  This document was prepared using Dragon voice recognition software and may include unintentional dictation errors.   Carrie Mew, MD 08/11/22 [phone]

## 2022-08-11 NOTE — ED Triage Notes (Addendum)
Pt in via EMS from home with c/o generalized weakness since 0300. Pt reports feels numb in her bilateral lower extremities. Pt with neuropathy but states this feels different. CBG 178, 98.5 temp oral. #20g to left [facility]

## 2022-08-11 NOTE — Assessment & Plan Note (Signed)
-   Hold home antihypertensives to allow for permissive hypertension until CVA is definitively ruled out

## 2022-08-11 NOTE — Assessment & Plan Note (Signed)
Approximately 8-year history of chronic hyponatremia that is mild in nature.  Stable at this time.  - Monitor sodium while admitted

## 2022-08-11 NOTE — H&P (Addendum)
History and Physical    Patient: Allison Hampton PYP:[id number] DOB: February 27, 1928 DOA: 08/11/2022 DOS: the patient was seen and examined on 08/11/2022 PCP: Derinda Late, MD  Patient coming from: Home  Chief Complaint:  Chief Complaint  Patient presents with   Weakness   HPI: Allison Hampton is a 88 y.o. 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 pylori, who presents to the ED with complaints of bilateral lower extremity weakness.  [name] states she was in her normal state of health when she awoke this morning at 2 AM to use the restroom.  When she attempted to walk, she realized that her bilateral feet were very heavy and she was experiencing difficulty walking.  Due to this, she required the use of her cane and had to hold onto the wall to walk.  She states the weakness was worse from her knee down bilaterally and equally.  She was able to get around her home with a shuffling gait.  Later in the morning when she was trying to get ready, she realized she was having difficulty holding onto her [name] due to finger weakness.  Patient's daughter at bedside, Allison Hampton, states that patient's voice also sounds altered.  Mr. Bobo denies any history of prior CVA. She takes daily Aspirin.  She denies any other complaints at this time including shortness of breath, chest pain, palpitations, recent illness, fever, chills, nausea, vomiting, diarrhea.  ED Course:  On arrival to the ED, patient was hypertensive at 156/74 with heart rate of 69.  She was saturating at 93% on room air.  Initial workup remarkable for sodium of 129, chloride of 97, glucose of 133, creatinine 0.49.  CBC with no abnormalities.  EKG with sinus rhythm.  Chest x-ray negative.  CT head negative.  TRH contacted for admission for TIA versus CVA  workup.  Review of Systems: As mentioned in the history of present illness. All other systems reviewed and are negative.  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 George Ina and pt referred to [facility] for txt   Squamous cell carcinoma of skin 10/20/2018   L hand dorsum   Squamous cell carcinoma of skin 07/21/2018   R pretibial below knww   Squamous cell carcinoma of skin 12/29/2014   SCC IS L dorsum foot   Squamous cell carcinoma of skin 08/17/2014   L lat calf   Squamous cell carcinoma of skin 04/01/2014   L upper arm   Squamous cell carcinoma of skin 05/18/2009   L dorsal hand   Squamous cell carcinoma of skin 10/02/2021   L mid pretibia, [facility]   Squamous cell carcinoma of skin 06/11/2022   left foot dorsum, [facility]   Squamous cell carcinoma of skin 07/31/2022   L dorsum foot, recurrent, needs Mohs with Dr. Manley Mason   Uterine mass    Past Surgical History:  Procedure Laterality Date   adenoids     cataract surgery     CHOLECYSTECTOMY     TONSILLECTOMY     Social History:  reports that she has been smoking cigarettes. She has been smoking an average of .25 packs per day. She has never used smokeless tobacco. She reports that she does not drink alcohol and does not use drugs.  Allergies  Allergen Reactions   Cetirizine Other (See Comments)    tachycardia   Ciprofloxacin    Codeine    Flagyl [Metronidazole]    Iodinated Contrast Media    Moxifloxacin Hcl In Nacl Other (See Comments)    hallucination   Penicillins     Has patient had a PCN reaction causing immediate rash, facial/tongue/throat swelling, SOB or lightheadedness with hypotension: yes Has patient had a PCN reaction causing severe rash involving mucus membranes or skin necrosis:  yes Has patient had a PCN reaction that required hospitalization no Has patient had a PCN reaction occurring within the last 10 years: no If all of the above answers are "NO", then may proceed with Cephalosporin use.    Shellfish Allergy    Statins Other (See Comments)    myalgias, unspecified   Sulfa Antibiotics     Family History  Problem Relation Age of Onset   Heart disease Mother    Glaucoma Mother    Aneurysm Mother    Cancer Neg Hx    Diabetes Neg Hx    Ovarian cancer Neg Hx     Prior to Admission medications   Medication Sig Start Date End Date Taking? Authorizing Provider  aspirin 81 MG chewable tablet Chew by mouth daily.    [provider]  diazepam (VALIUM) 5 MG tablet Take 0.5 mg by mouth daily.  08/10/15   [provider]  docusate calcium (SURFAK) 240 MG capsule Take by mouth daily as needed.     [provider]  ferrous sulfate 325 (65 FE) MG tablet Take 325 mg by mouth daily with breakfast.    [provider]  fluticasone (FLONASE) 50 MCG/ACT nasal spray fluticasone propionate 50 mcg/actuation nasal spray,suspension 07/03/18   [provider]  hydrochlorothiazide (HYDRODIURIL) 12.5 MG tablet TAKE ONE TABLET EVERY DAY 01/16/19   [provider]  loratadine (CLARITIN) 10 MG tablet Take 10 mg by mouth daily.    [provider]  losartan (COZAAR) 50 MG tablet Take 50 mg by mouth daily.    [provider]  Multiple Vitamins-Minerals (CENTRUM SILVER PO) Take 1 tablet by mouth daily.     [provider]  mupirocin ointment (BACTROBAN) 2 % Apply 1-3 times per day to sores on legs. 12/01/20   Ralene Bathe, MD  mupirocin ointment Drue Stager) 2 % Apply to wounds qd-bid 06/11/22   Brendolyn Patty, MD  omeprazole (PRILOSEC) 20 MG capsule Take 20 mg by mouth daily. 01/15/19   [provider]  Probiotic Product (GNP PROBIOTIC COLON SUPPORT) CAPS Take by mouth.    [provider]   vitamin B-12 (CYANOCOBALAMIN) 1000 MCG tablet Take 1,000 mcg by mouth daily.    [provider]    Physical Exam: Vitals:   08/11/22 1118 08/11/22 1446  BP: (!) 156/74 (!) 140/70  Pulse: 69 (!) 58  Resp: 18 16  Temp: 98.4 F (36.9 C)   TempSrc: Oral   SpO2: 93% 96%   Physical Exam Vitals and nursing note reviewed.  Constitutional:      General: She is not in acute distress.    Appearance: She is normal weight. She is not toxic-appearing.  HENT:     Head: Normocephalic and atraumatic.  Mouth/Throat:     Mouth: Mucous membranes are moist. No angioedema.     Pharynx: Oropharynx is clear. Uvula midline. No oropharyngeal exudate, posterior oropharyngeal erythema or uvula swelling.  Eyes:     General: No scleral icterus.    Extraocular Movements: Extraocular movements intact.     Conjunctiva/sclera: Conjunctivae normal.     Pupils: Pupils are equal, round, and reactive to light.  Cardiovascular:     Rate and Rhythm: Normal rate and regular rhythm.     Heart sounds:     Gallop (Diastolic gallop, likely S3 vs split S2) present.  Pulmonary:     Effort: Pulmonary effort is normal. No respiratory distress.     Breath sounds: Normal breath sounds. No wheezing, rhonchi or rales.  Abdominal:     General: Bowel sounds are normal. There is no distension.     Palpations: Abdomen is soft.     Tenderness: There is no abdominal tenderness. There is no guarding.  Musculoskeletal:     Cervical back: Neck supple.     Right lower leg: No edema.     Left lower leg: No edema.  Skin:    General: Skin is warm and dry.  Neurological:     Mental Status: She is alert.     Comments:  - Cranial nerves II through X intact. - No facial asymmetry. - Although no difficulty with pronunciation, voice is deep and would characterize as "hot potato voice" - Sensation intact throughout - Bilateral upper and lower extremity 5 out of 5 strength both distal and peripheral. - Finger-to-nose  within normal limits bilaterally  Psychiatric:        Mood and Affect: Mood normal.        Behavior: Behavior normal.        Thought Content: Thought content normal.        Judgment: Judgment normal.    Data Reviewed: CBC with WBC of 5.5, hemoglobin of 13.9, platelets of 220.  Similar to labs obtained 5 months ago. BMP with sodium of 129, potassium 3.8, chloride 97, bicarb of 24, glucose of 133, creatinine 0.49 and calcium of 8.7.  Prior sodium obtained 5 months ago 133. Troponin negative x 2.  EKG personally reviewed.  Sinus rhythm with a rate of 68.  1 PVC noted.  CT HEAD WO CONTRAST (5MM)  Result Date: 08/11/2022 CLINICAL DATA:  88 year old female with generalized weakness since 0300 hours. Lower extremity numbness. EXAM: CT HEAD WITHOUT CONTRAST TECHNIQUE: Contiguous axial images were obtained from the base of the skull through the vertex without intravenous contrast. RADIATION DOSE REDUCTION: This exam was performed according to the departmental dose-optimization program which includes automated exposure control, adjustment of the mA and/or kV according to patient size and/or use of iterative reconstruction technique. COMPARISON:  Head CT 03/12/2022. FINDINGS: Brain: Stable cerebral volume. No midline shift, mass effect, or evidence of intracranial mass lesion. No ventriculomegaly. No acute intracranial hemorrhage identified. Chronic vascular/dystrophic calcifications in the bilateral basal ganglia. Stable gray-white matter differentiation throughout the brain. No cortically based acute infarct identified. No cortical encephalomalacia identified. Vascular: Calcified atherosclerosis at the skull base. No suspicious intracranial vascular hyperdensity. Skull: No acute osseous abnormality identified. Sinuses/Orbits: Visualized paranasal sinuses and mastoids are stable and well aerated. Other: No acute orbit or scalp soft tissue finding. IMPRESSION: Stable since July and largely negative for age. No  acute intracranial abnormality. Electronically Signed   By: Genevie Ann M.D.   On: 08/11/2022 12:01   DG Chest 2  View  Result Date: 08/11/2022 CLINICAL DATA:  Acute onset of chest pain and generalized weakness EXAM: CHEST - 2 VIEW COMPARISON:  Chest radiograph dated 03/12/2022 FINDINGS: Mildly hyperinflated lungs. No focal consolidations. No pleural effusion or pneumothorax. The heart size and mediastinal contours are within normal limits. The visualized skeletal structures are unremarkable. IMPRESSION: No active cardiopulmonary disease. Electronically Signed   By: Darrin Nipper M.D.   On: 08/11/2022 11:56    Results are pending, will review when available.  Assessment and Plan: * TIA (transient ischemic attack) Patient presenting with bilateral lower extremity weakness, right hand weakness and change in voice.  On examination, her extremity weakness appears to have resolved.  Her voice remains altered however. Oropharyngeal examination is unremarkable. Given this patient's history of hypertension, hyperlipidemia and prior CT imaging with significant atherosclerosis, most likely TIA vs CVA due to small vessel disease.  - MRI brain, MRA head, MRA neck pending - Will consult neurology if MRI demonstrates acute CVA - Telemetry monitoring - Echocardiogram ordered - PT/OT/SLP - Hold home antihypertensives to allow for permissive hypertension until MRI results - One time dose of aspirin 324 mg - Start high intensity statin given LDL is above goal at 111.  Goal LDL less than 70 - A1c pending  Benign essential HTN - Hold home antihypertensives to allow for permissive hypertension until CVA is definitively ruled out  Chronic hyponatremia Approximately 8-year history of chronic hyponatremia that is mild in nature.  Stable at this time.  - Monitor sodium while admitted  Chronic obstructive pulmonary disease ([place]) Per chart review, patient has a history of COPD but is not currently on any bronchodilators.   Examination with no wheezing at this time.  Abnormal echocardiogram findings without diagnosis Per chart review, patient had a stress echo in 2005 that demonstrated left ejection fraction of 45% with anterior apical ischemia.  I am unable to find echocardiogram report or any subsequent follow-up. Otherwise, no diagnosis of CHF. She appears euvolemic on examination.  - Echocardiogram pending  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HCC) CT chest in 2021 with 4.3 CM ascending thoracic aorta dilation. Recommended annual imaging. She is overdue at this time.   - Outpatient CTA or MRA  Advance Care Planning:   Code Status: DNR.  Patient states she has signed a DO NOT RESUSCITATE form in the past and it is still in line with her goals at this time.  Consults: None at this time.  Family Communication: Patient's daughter Allison Hampton updated at bedside  Severity of Illness: The appropriate patient status for this patient is OBSERVATION. Observation status is judged to be reasonable and necessary in order to provide the required intensity of service to ensure the patient's safety. The patient's presenting symptoms, physical exam findings, and initial radiographic and laboratory data in the context of their medical condition is felt to place them at decreased risk for further clinical deterioration. Furthermore, it is anticipated that the patient will be medically stable for discharge from the hospital within 2 midnights of admission.   Author: Jose Persia, MD 08/11/2022 6:04 PM  For on call review [email or web address].

## 2022-08-11 NOTE — Assessment & Plan Note (Signed)
Per chart review, patient has a history of COPD but is not currently on any bronchodilators.  Examination with no wheezing at this time.

## 2022-08-11 NOTE — Assessment & Plan Note (Signed)
CT chest in 2021 with 4.3 CM ascending thoracic aorta dilation. Recommended annual imaging. She is overdue at this time.   - Outpatient CTA or MRA

## 2022-08-12 ENCOUNTER — Observation Stay (HOSPITAL_BASED_OUTPATIENT_CLINIC_OR_DEPARTMENT_OTHER)
Admit: 2022-08-12 | Discharge: 2022-08-12 | Disposition: A | Discharge status: Home / Self Care | Payer: Medicare Other | Attending: Internal Medicine | Admitting: Internal Medicine

## 2022-08-12 DIAGNOSIS — Z79899 Other long term (current) drug therapy: Secondary | ICD-10-CM | POA: Diagnosis not present

## 2022-08-12 DIAGNOSIS — I639 Cerebral infarction, unspecified: Secondary | ICD-10-CM | POA: Diagnosis not present

## 2022-08-12 DIAGNOSIS — J449 Chronic obstructive pulmonary disease, unspecified: Secondary | ICD-10-CM | POA: Diagnosis present

## 2022-08-12 DIAGNOSIS — G8194 Hemiplegia, unspecified affecting left nondominant side: Secondary | ICD-10-CM | POA: Diagnosis present

## 2022-08-12 DIAGNOSIS — Z8719 Personal history of other diseases of the digestive system: Secondary | ICD-10-CM | POA: Diagnosis not present

## 2022-08-12 DIAGNOSIS — I5022 Chronic systolic (congestive) heart failure: Secondary | ICD-10-CM | POA: Diagnosis present

## 2022-08-12 DIAGNOSIS — N952 Postmenopausal atrophic vaginitis: Secondary | ICD-10-CM | POA: Diagnosis present

## 2022-08-12 DIAGNOSIS — I6389 Other cerebral infarction: Secondary | ICD-10-CM | POA: Diagnosis not present

## 2022-08-12 DIAGNOSIS — Z8249 Family history of ischemic heart disease and other diseases of the circulatory system: Secondary | ICD-10-CM | POA: Diagnosis not present

## 2022-08-12 DIAGNOSIS — Z8711 Personal history of peptic ulcer disease: Secondary | ICD-10-CM | POA: Diagnosis not present

## 2022-08-12 DIAGNOSIS — K219 Gastro-esophageal reflux disease without esophagitis: Secondary | ICD-10-CM | POA: Diagnosis present

## 2022-08-12 DIAGNOSIS — I6302 Cerebral infarction due to thrombosis of basilar artery: Secondary | ICD-10-CM | POA: Diagnosis not present

## 2022-08-12 DIAGNOSIS — L570 Actinic keratosis: Secondary | ICD-10-CM | POA: Diagnosis present

## 2022-08-12 DIAGNOSIS — Z8619 Personal history of other infectious and parasitic diseases: Secondary | ICD-10-CM | POA: Diagnosis not present

## 2022-08-12 DIAGNOSIS — R29701 NIHSS score 1: Secondary | ICD-10-CM | POA: Diagnosis present

## 2022-08-12 DIAGNOSIS — F419 Anxiety disorder, unspecified: Secondary | ICD-10-CM | POA: Diagnosis present

## 2022-08-12 DIAGNOSIS — E785 Hyperlipidemia, unspecified: Secondary | ICD-10-CM | POA: Diagnosis present

## 2022-08-12 DIAGNOSIS — Z85828 Personal history of other malignant neoplasm of skin: Secondary | ICD-10-CM | POA: Diagnosis not present

## 2022-08-12 DIAGNOSIS — I340 Nonrheumatic mitral (valve) insufficiency: Secondary | ICD-10-CM | POA: Diagnosis present

## 2022-08-12 DIAGNOSIS — I6329 Cerebral infarction due to unspecified occlusion or stenosis of other precerebral arteries: Secondary | ICD-10-CM | POA: Diagnosis present

## 2022-08-12 DIAGNOSIS — G8191 Hemiplegia, unspecified affecting right dominant side: Secondary | ICD-10-CM | POA: Diagnosis present

## 2022-08-12 DIAGNOSIS — I110 Hypertensive heart disease with heart failure: Secondary | ICD-10-CM | POA: Diagnosis present

## 2022-08-12 DIAGNOSIS — Z66 Do not resuscitate: Secondary | ICD-10-CM | POA: Diagnosis present

## 2022-08-12 DIAGNOSIS — E871 Hypo-osmolality and hyponatremia: Secondary | ICD-10-CM | POA: Diagnosis present

## 2022-08-12 DIAGNOSIS — I77810 Thoracic aortic ectasia: Secondary | ICD-10-CM | POA: Diagnosis present

## 2022-08-12 DIAGNOSIS — F17210 Nicotine dependence, cigarettes, uncomplicated: Secondary | ICD-10-CM | POA: Diagnosis present

## 2022-08-12 DIAGNOSIS — R29810 Facial weakness: Secondary | ICD-10-CM | POA: Diagnosis present

## 2022-08-12 LAB — BASIC METABOLIC PANEL
Anion gap: 7 (ref 5–15)
BUN: 11 mg/dL (ref 8–23)
CO2: 26 mmol/L (ref 22–32)
Calcium: 8.5 mg/dL — ABNORMAL LOW (ref 8.9–10.3)
Chloride: 101 mmol/L (ref 98–111)
Creatinine, Ser: 0.47 mg/dL (ref 0.44–1.00)
GFR, Estimated: >60 mL/min (ref >60)
Glucose, Bld: 90 mg/dL (ref 70–99)
Potassium: 3.9 mmol/L (ref 3.5–5.1)
Sodium: 134 mmol/L — ABNORMAL LOW (ref 135–145)

## 2022-08-12 LAB — ECHOCARDIOGRAM COMPLETE
AR max vel: 1.95 cm2
AV Area VTI: 1.84 cm2
AV Area mean vel: 1.83 cm2
AV Mean grad: 5 mmHg
AV Peak grad: 8.8 mmHg
Ao pk vel: 1.48 m/s
Area-P 1/2: 3.27 cm2
Calc EF: 74.5 %
Height: 65 in
MV M vel: 5.61 m/s
MV Peak grad: 125.7 mmHg
MV VTI: 1.18 cm2
Radius: 0.55 cm
S' Lateral: 2.7 cm
Single Plane A2C EF: 74.2 %
Single Plane A4C EF: 75.3 %
Weight: 2331.58 oz

## 2022-08-12 LAB — CBC WITH DIFFERENTIAL/PLATELET
Abs Immature Granulocytes: 0.01 10*3/uL (ref 0.00–0.07)
Basophils Absolute: 0 10*3/uL (ref 0.0–0.1)
Basophils Relative: 1 %
Eosinophils Absolute: 0.1 10*3/uL (ref 0.0–0.5)
Eosinophils Relative: 2 %
HCT: 38.2 % (ref 36.0–46.0)
Hemoglobin: 12.9 g/dL (ref 12.0–15.0)
Immature Granulocytes: 0 %
Lymphocytes Relative: 34 %
Lymphs Abs: 1.3 10*3/uL (ref 0.7–4.0)
MCH: 31.4 pg (ref 26.0–34.0)
MCHC: 33.8 g/dL (ref 30.0–36.0)
MCV: 92.9 fL (ref 80.0–100.0)
Monocytes Absolute: 0.4 10*3/uL (ref 0.1–1.0)
Monocytes Relative: 10 %
Neutro Abs: 2.1 10*3/uL (ref 1.7–7.7)
Neutrophils Relative %: 53 %
Platelets: 206 10*3/uL (ref 150–400)
RBC: 4.11 MIL/uL (ref 3.87–5.11)
RDW: 11.9 % (ref 11.5–15.5)
WBC: 3.9 10*3/uL — ABNORMAL LOW (ref 4.0–10.5)
nRBC: 0 % (ref 0.0–0.2)

## 2022-08-12 MED ORDER — ENOXAPARIN SODIUM 40 MG/0.4ML IJ SOSY
40.0000 mg | PREFILLED_SYRINGE | INTRAMUSCULAR | Status: DC
  Administered 2022-08-12 – 2022-08-13 (×2): 40 mg via SUBCUTANEOUS
  Filled 2022-08-12 (×2): qty 0.4

## 2022-08-12 NOTE — Hospital Course (Addendum)
Taken from H&P.  Allison Hampton is a 88 y.o. 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 pylori, who presents to the ED with complaints of bilateral lower extremity weakness, sudden onset on waking up at 2 AM due to use the restroom.  Later she was having difficulty holding onto her [name] due to finger weakness.  Per daughter her voice sounds altered too. Rest of the review of system was negative.  No prior similar episodes.  ED Course:  On arrival to the ED, patient was hypertensive at 156/74 with heart rate of 69.  She was saturating at 93% on room air.  Initial workup remarkable for sodium of 129, chloride of 97, glucose of 133, creatinine 0.49.  CBC with no abnormalities.  EKG with sinus rhythm.  Chest x-ray negative.  CT head negative.    12/10:MRI shows an acute infarct in the left pons. MRA shows no flow signal is seen in the left vertebral artery from his origin through the distal V4 segment, a CTA head and neck is recommended for further evaluation. No other hemodynamically significant stenosis in the head or neck. Also noted to have an outpouching from the left cavernous ICA measuring 3 x 3 x 2 mm concerning for a small aneurysm.  Neurology was consulted. Lipid panel with LDL of 111 with a goal of less than 70. Sodium improved to 134.   Echo with normal EF and Grade 1 diastolic  dysfunction.,  Moderate to severe mitral regurgitation. PT/OT recommending home health. Neurology was consulted

## 2022-08-12 NOTE — Progress Notes (Signed)
Progress Note   Patient: Allison Hampton TKW:[id number] DOB: 1928-07-18 DOA: 08/11/2022     0 DOS: the patient was seen and examined on 08/12/2022   Brief hospital course: Taken from H&P.  Allison Hampton is a 88 y.o. 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 pylori, who presents to the ED with complaints of bilateral lower extremity weakness, sudden onset on waking up at 2 AM due to use the restroom.  Later she was having difficulty holding onto her [name] due to finger weakness.  Per daughter her voice sounds altered too. Rest of the review of system was negative.  No prior similar episodes.  ED Course:  On arrival to the ED, patient was hypertensive at 156/74 with heart rate of 69.  She was saturating at 93% on room air.  Initial workup remarkable for sodium of 129, chloride of 97, glucose of 133, creatinine 0.49.  CBC with no abnormalities.  EKG with sinus rhythm.  Chest x-ray negative.  CT head negative.    12/10:MRI shows an acute infarct in the left pons. MRA shows no flow signal is seen in the left vertebral artery from his origin through the distal V4 segment, a CTA head and neck is recommended for further evaluation. No other hemodynamically significant stenosis in the head or neck. Also noted to have an outpouching from the left cavernous ICA measuring 3 x 3 x 2 mm concerning for a small aneurysm.  Neurology was consulted. Lipid panel with LDL of 111 with a goal of less than 70. Sodium improved to 134.   Echo with normal EF and Grade 1 diastolic  dysfunction.,  Moderate to severe mitral regurgitation. PT/OT recommending home health. Neurology was consulted     Assessment and Plan: * Stroke [facility]) Patient presenting with bilateral lower extremity weakness, right hand weakness and change in voice.   Continues to have mild right upper extremity weakness. MRI brain with left pontine stroke.  Neurology was consulted. MRA shows no flow signal is seen in the left vertebral artery from his origin through the distal V4 segment, Echocardiogram with normal EF, grade 1 diastolic dysfunction and severe mitral regurgitation. PT/OT recommending home health. Lipid panel with LDL of 111 -Continue aspirin and statin   Benign essential HTN - Hold home antihypertensives to allow for permissive hypertension   Chronic hyponatremia Approximately 8-year history of chronic hyponatremia that is mild in nature.  Stable at this time.  - Monitor sodium while admitted  Chronic obstructive pulmonary disease ([place]) Per chart review, patient has a history of COPD but is not currently on any bronchodilators.  Examination with no wheezing at this time.  Abnormal echocardiogram findings without diagnosis Per chart review, patient had a stress echo in 2005 that demonstrated left ejection fraction of 45% with anterior apical ischemia.  I am unable to find echocardiogram report or any subsequent follow-up. Otherwise, no diagnosis of CHF. She appears euvolemic on examination. -Repeat echocardiogram with normal EF and grade 1 diastolic dysfunction  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HCC) CT chest in 2021 with 4.3 CM ascending thoracic aorta dilation. Recommended annual imaging. She is overdue at this time.   - Outpatient CTA or MRA   Subjective: Patient was seen and examined today.  Continued to feel overall weak, right hand grip weakness. Speech abnormality has been resolved per daughter.  Physical Exam:  Vitals:   08/12/22 0410 08/12/22 1022 08/12/22 1235 08/12/22 1612  BP: 133/81 (!) 172/51 111/68 121/79  Pulse: 66 68 65 63  Resp: '18 16 16 16  '$ Temp: 98.3 F (36.8 C) 98.1 F (36.7 C) 98 F (36.7 C) 98 F (36.7 C)  TempSrc: Oral     SpO2: 99% 95% 96% 91%  Weight:      Height:       General.  Frail elderly lady, in no  acute distress. Pulmonary.  Lungs clear bilaterally, normal respiratory effort. CV.  Regular rate and rhythm, no JVD, rub or murmur. Abdomen.  Soft, nontender, nondistended, BS positive. CNS.  Alert and oriented .4+/5 in right upper extremity, rest 5/5 Extremities.  No edema, no cyanosis, pulses intact and symmetrical. Psychiatry.  Judgment and insight appears normal.   Data Reviewed: Prior data reviewed  Family Communication: Discussed with daughter at bedside  Disposition: Status is: Inpatient Remains inpatient appropriate because: Severity of illness  Planned Discharge Destination: Home with Home Health  DVT prophylaxis.  Lovenox Time spent: 45 minutes  This record has been created using Systems analyst. Errors have been sought and corrected,but may not always be located. Such creation errors do not reflect on the standard of care.   Author: Lorella Nimrod, MD 08/12/2022 4:33 PM  For on call review [email or web address].

## 2022-08-12 NOTE — Evaluation (Signed)
Occupational Therapy Evaluation Patient Details Name: Allison Hampton MRN: [id number] DOB: 06/03/1928 Today's Date: 08/12/2022   History of Present Illness 88 yo female presents to the ED with complaints of bilateral lower extremity weakness. 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 "Acute infarct in the left pons" per MRI report 12/9   Clinical Impression   Pt seen for OT evaluation, daughter present. Prior to admission, pt was living in ILF, was Mod I for ADLs, and normally does not use an AD for functional mobility. Pt currently functioning at supervision for bed mobility and was able to complete lateral scooting at EOB before returning to supine. Evaluation limited this date 2/2 ultrasound entering the room and requiring pt to be supine in bed. Pt  will benefit from acute OT to increase overall independence in the areas of ADLs and functional mobility in order to safely discharge home. Pt could benefit from [facility] following D/C to decrease falls risk, improve balance, and maximize independence in self-care within own home environment.      Recommendations for follow up therapy are one component of a multi-disciplinary discharge planning process, led by the attending physician.  Recommendations may be updated based on patient status, additional functional criteria and insurance authorization.   Follow Up Recommendations  Home health OT     Assistance Recommended at Discharge Intermittent Supervision/Assistance  Patient can return home with the following A little help with walking and/or transfers;A little help with bathing/dressing/bathroom;Assistance with cooking/housework;Assist for transportation;Help with stairs or ramp for entrance    Functional Status Assessment  Patient has had a recent decline in their functional status and demonstrates the ability to make significant improvements in function in a reasonable and  predictable amount of time.  Equipment Recommendations  None recommended by OT    Recommendations for Other Services       Precautions / Restrictions Precautions Precautions: Fall Restrictions Weight Bearing Restrictions: No      Mobility Bed Mobility Overal bed mobility: Needs Assistance Bed Mobility: Supine to Sit, Sit to Supine     Supine to sit: Supervision Sit to supine: Supervision   General bed mobility comments: able to scoot hips forward at EOB and then laterally toward [facility] with supervision    Transfers                   General transfer comment: unable to assess      Balance Overall balance assessment: Needs assistance Sitting-balance support: Feet supported Sitting balance-Leahy Scale: Good         Standing balance comment: unable to assess                           ADL either performed or assessed with clinical judgement   ADL Overall ADL's : Needs assistance/impaired     Grooming: Set up;Sitting               Lower Body Dressing: Sitting/lateral leans;Set up;Supervision/safety Lower Body Dressing Details (indicate cue type and reason): anticipate               General ADL Comments: Evaluation limited this date 2/2 ultrasound entering the room & requiring pt to be supine in bed (unable to assess standing ADL tasks, functional transfers, or functional mobility)     Vision Baseline Vision/History: 6 Macular Degeneration Ability to See in Adequate Light: 1 Impaired Patient Visual Report: No change  from baseline Vision Assessment?: Yes Eye Alignment: Within Functional Limits Ocular Range of Motion: Within Functional Limits Alignment/Gaze Preference: Within Defined Limits Tracking/Visual Pursuits: Able to track stimulus in all quads without difficulty Visual Fields: No apparent deficits Additional Comments: finger to nose intact, denied any blurry vision or diplopia     Perception     Praxis      Pertinent  Vitals/Pain Pain Assessment Pain Assessment: No/denies pain     Hand Dominance     Extremity/Trunk Assessment Upper Extremity Assessment Upper Extremity Assessment: Generalized weakness;RUE deficits/detail (BUE AROM WFL, thumb opposition intact) RUE Deficits / Details: fair grip strength, grossly 4/5   Lower Extremity Assessment Lower Extremity Assessment: Generalized weakness (reports neuropathy in feet at baseline)   Cervical / Trunk Assessment Cervical / Trunk Assessment: Normal   Communication Communication Communication: HOH (has hearing aids)   Cognition Arousal/Alertness: Awake/alert Behavior During Therapy: WFL for tasks assessed/performed Overall Cognitive Status: Within Functional Limits for tasks assessed                                       General Comments       Exercises Other Exercises Other Exercises: OT provided education re: role of OT, OT POC, post acute recs, sitting up for all meals, EOB/OOB mobility with assistance, home/fall safety.     Shoulder Instructions      Home Living Family/patient expects to be discharged to:: Private residence Living Arrangements: Alone Available Help at Discharge: Family;Available PRN/intermittently Type of Home: Independent living facility Home Access: Level entry     Home Layout: One level     Bathroom Shower/Tub: Occupational psychologist: Handicapped height     Home Equipment: [place] - single point;Rollator (4 wheels);Grab bars - tub/shower   Additional Comments: Daughter lives nearby  Lives With: Alone    Prior Functioning/Environment Prior Level of Function : Independent/Modified Independent             Mobility Comments: normally no AD, denies history of falls past 6 months ADLs Comments: Mod I ADLs (does laundry, cooks), will sometimes walk down to [employer] ~1 mile away or neighbor will drive her (just provides lunch), daughter provides transportation.         OT Problem List: Decreased strength;Decreased activity tolerance;Impaired balance (sitting and/or standing);Impaired vision/perception;Decreased knowledge of use of DME or AE      OT Treatment/Interventions: Self-care/ADL training;Therapeutic exercise;Therapeutic activities;Energy conservation;DME and/or AE instruction;Patient/family education;Balance training    OT Goals(Current goals can be found in the care plan section) Acute Rehab OT Goals Patient Stated Goal: go home OT Goal Formulation: With patient/family Time For Goal Achievement: 08/26/22 Potential to Achieve Goals: Good   OT Frequency: Min 2X/week    Co-evaluation              AM-PAC OT "6 Clicks" Daily Activity     Outcome Measure Help from another person eating meals?: None Help from another person taking care of personal grooming?: A Little Help from another person toileting, which includes using toliet, bedpan, or urinal?: A Little Help from another person bathing (including washing, rinsing, drying)?: A Little Help from another person to put on and taking off regular upper body clothing?: None Help from another person to put on and taking off regular lower body clothing?: A Little 6 Click Score: 20   End of Session Nurse Communication: Mobility status  Activity Tolerance: Patient tolerated treatment well Patient left: in bed;with call bell/phone within reach;with bed alarm set;with family/visitor present (ultrasound tech in room)  OT Visit Diagnosis: Other abnormalities of gait and mobility (R26.89);Muscle weakness (generalized) (M62.81);Low vision, both eyes (H54.2)                Time: 6226-3335 OT Time Calculation (min): 23 min Charges:  OT General Charges $OT Visit: 1 Visit OT Evaluation $OT Eval Low Complexity: 1 Low  [facility] MS, OTR/L ascom [phone]  08/12/22, 5:27 PM

## 2022-08-12 NOTE — Evaluation (Signed)
Physical Therapy Evaluation Patient Details Name: Allison Hampton MRN: [id number] DOB: 06-May-1928 Today's Date: 08/12/2022  History of Present Illness  88 yo female presents to the ED with complaints of bilateral lower extremity weakness. 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 "Acute infarct in the left pons" per MRI report 12/9  Clinical Impression  Pt found supine in bed upon PT entry. Pt required supervision with bed mobility. Sit<>Stand attempted with no AD and mod assist x1. Reattempted with RW and supervision. Pt ambualted 160 ft with RW and supervision with VC for RW mechanics. Pt stated she felt more steady with the RW despite her baseline being no AD. Pt would benefit from skilled physical therapy to address the listed deficits (see below) to increase independence with ADLs and function.  Current recommendation is HHPT with intermittent assist to return pt to PLOF.         Recommendations for follow up therapy are one component of a multi-disciplinary discharge planning process, led by the attending physician.  Recommendations may be updated based on patient status, additional functional criteria and insurance authorization.  Follow Up Recommendations Home health PT      Assistance Recommended at Discharge Intermittent Supervision/Assistance  Patient can return home with the following  A little help with walking and/or transfers;A little help with bathing/dressing/bathroom;Assistance with cooking/housework;Assist for transportation;Help with stairs or ramp for entrance    Equipment Recommendations Rolling walker (2 wheels)  Recommendations for Other Services       Functional Status Assessment Patient has had a recent decline in their functional status and demonstrates the ability to make significant improvements in function in a reasonable and predictable amount of time.     Precautions / Restrictions  Precautions Precautions: Fall Restrictions Weight Bearing Restrictions: No      Mobility  Bed Mobility Overal bed mobility: Needs Assistance Bed Mobility: Supine to Sit     Supine to sit: Supervision     General bed mobility comments: min vc    Transfers Overall transfer level: Needs assistance Equipment used: Rolling walker (2 wheels) Transfers: Sit to/from Stand Sit to Stand: Supervision, Mod assist           General transfer comment: mod assist x1 without AD, supervision with AD    Ambulation/Gait Ambulation/Gait assistance: Supervision Gait Distance (Feet): 160 Feet Assistive device: Rolling walker (2 wheels) Gait Pattern/deviations: WFL(Within Functional Limits), Trunk flexed       General Gait Details: mod VC for keeping feet in the walker  Stairs            Wheelchair Mobility    Modified Rankin (Stroke Patients Only)       Balance Overall balance assessment: Needs assistance Sitting-balance support: Feet supported Sitting balance-Leahy Scale: Good     Standing balance support: Bilateral upper extremity supported Standing balance-Leahy Scale: Good                               Pertinent Vitals/Pain Pain Assessment Pain Assessment: No/denies pain    Home Living Family/patient expects to be discharged to:: Other (Comment) (ILF) Living Arrangements: Alone   Type of Home: Assisted living                  Prior Function Prior Level of Function : Independent/Modified Independent               ADLs  Comments: friends drive her and uses rollator if she feels dizzy; normally no AD at baseline     Hand Dominance        Extremity/Trunk Assessment   Upper Extremity Assessment Upper Extremity Assessment: Generalized weakness    Lower Extremity Assessment Lower Extremity Assessment: Generalized weakness    Cervical / Trunk Assessment Cervical / Trunk Assessment: Normal  Communication   Communication:  HOH  Cognition Arousal/Alertness: Awake/alert Behavior During Therapy: WFL for tasks assessed/performed Overall Cognitive Status: Within Functional Limits for tasks assessed                                          General Comments      Exercises     Assessment/Plan    PT Assessment Patient needs continued PT services  PT Problem List Decreased strength;Decreased mobility;Decreased coordination;Decreased activity tolerance;Decreased balance;Decreased knowledge of use of DME       PT Treatment Interventions DME instruction;Therapeutic activities;Gait training;Therapeutic exercise;Patient/family education;Stair training;Balance training;Functional mobility training    PT Goals (Current goals can be found in the Care Plan section)  Acute Rehab PT Goals Patient Stated Goal: to return home PT Goal Formulation: With patient Time For Goal Achievement: 08/26/22 Potential to Achieve Goals: Good    Frequency 7X/week     Co-evaluation               AM-PAC PT "6 Clicks" Mobility  Outcome Measure Help needed turning from your back to your side while in a flat bed without using bedrails?: A Little Help needed moving from lying on your back to sitting on the side of a flat bed without using bedrails?: A Little Help needed moving to and from a bed to a chair (including a wheelchair)?: A Little Help needed standing up from a chair using your arms (e.g., wheelchair or bedside chair)?: A Little Help needed to walk in hospital room?: A Little Help needed climbing 3-5 steps with a railing? : A Little 6 Click Score: 18    End of Session Equipment Utilized During Treatment: Gait belt Activity Tolerance: Patient tolerated treatment well Patient left: in chair;with call bell/phone within reach;with family/visitor present   PT Visit Diagnosis: Unsteadiness on feet (R26.81);Other abnormalities of gait and mobility (R26.89);Muscle weakness (generalized) (M62.81)     Time: 5009-3818 PT Time Calculation (min) (ACUTE ONLY): 24 min   Charges:             Claiborne Billings O'Daniel, SPT  08/12/2022, 3:18 PM

## 2022-08-12 NOTE — Consult Note (Signed)
Neurology Consultation Reason for Consult: Stroke Referring Physician: Latina Craver  CC: Dysarthria  History is obtained from: Patient  HPI: Allison Hampton is a 88 y.o. female who presents after acute onset of difficulty walking started at 3 AM.  She was last in her normal state of health on 12/8 prior to bed and then when she woke up at 3 AM on 12/9 she was having trouble walking.  She went to bed when she woke up again she was improved, and has steadily improved since that time but still notices trouble with walking and slurred speech.   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 George Ina and pt referred to [facility] for txt   Squamous cell carcinoma of skin 10/20/2018   L hand dorsum   Squamous cell carcinoma of skin 07/21/2018   R pretibial below knww   Squamous cell carcinoma of skin 12/29/2014   SCC IS L dorsum foot   Squamous cell carcinoma of skin 08/17/2014   L lat calf   Squamous cell carcinoma of skin 04/01/2014   L upper arm   Squamous cell carcinoma of skin 05/18/2009   L dorsal hand   Squamous cell carcinoma of skin 10/02/2021   L mid pretibia, [facility]   Squamous cell carcinoma of skin 06/11/2022   left foot dorsum, [facility]   Squamous cell carcinoma of skin 07/31/2022   L dorsum foot, recurrent, needs Mohs with Dr. Manley Mason   Uterine mass      Family History  Problem Relation Age of Onset   Heart disease Mother    Glaucoma Mother    Aneurysm Mother    Cancer Neg Hx    Diabetes Neg Hx    Ovarian cancer Neg Hx      Social History:  reports that she has been smoking cigarettes. She has been smoking an average of .25 packs per day. She has never used smokeless tobacco.  She reports that she does not drink alcohol and does not use drugs.   Exam: Current vital signs: BP 111/68   Pulse 65   Temp 98 F (36.7 C)   Resp 16   Ht '5\' 5"'$  (1.651 m)   Wt 66.1 kg   SpO2 96%   BMI 24.25 kg/m  Vital signs in last 24 hours: Temp:  [97.4 F (36.3 C)-98.3 F (36.8 C)] 98 F (36.7 C) (12/10 1235) Pulse Rate:  [59-68] 65 (12/10 1235) Resp:  [16-19] 16 (12/10 1235) BP: (111-172)/(51-81) 111/68 (12/10 1235) SpO2:  [93 %-99 %] 96 % (12/10 1235) Weight:  [66.1 kg] 66.1 kg (12/10 0400)   Physical Exam  Constitutional: Appears well-developed and well-nourished.  Psych: Affect appropriate to situation Eyes: No scleral injection HENT: No OP obstruction MSK: no joint deformities.  Cardiovascular: Normal rate and regular rhythm.  Respiratory: Effort normal, non-labored breathing GI: Soft.  No distension. There is no tenderness.  Skin: WDI  Neuro: Mental Status: Patient is awake, alert, oriented to person, place, month, year, and situation. Patient is able to give a clear and coherent history. No signs of aphasia or neglect Cranial Nerves: II:  Visual Fields are full. Pupils are equal, round, and reactive to light.   III,IV, VI: EOMI without ptosis or diploplia.  V: Facial sensation is symmetric to temperature VII: Facial movement with mild right facial weakness.  VIII: hearing is intact to voice X: Uvula elevates symmetrically XI: Shoulder shrug is symmetric. XII: tongue is midline without atrophy or fasciculations.  Motor: She has very mild right-sided weakness of the arm and leg Sensory: Sensation is symmetric to light touch and temperature in the arms and legs. Cerebellar: He has mild difficulty with finger-nose-finger on the right, intact on the left     I have reviewed labs in epic and the results pertinent to this consultation are: Ldl 111 A1c pending  Echo without embolic source Telemetry with sinus rhythm  I have reviewed the images  obtained: MRI brain with small vessel ischemic infarct in the pons  Impression: 88 year old female with small vessel ischemic infarct.  Secondary risk factor modification follows blood pressure control, cholesterol control, assessment for diabetes with goal A1c less than seven.  I would favor dual antiplatelet therapy for 3 weeks, followed by monotherapy.  She has had problems with statin intolerance in the past, and if she is unable to tolerate statins may need to be PCSK9 inhibitors.  Even with her advanced age, I do think would be beneficial to control her LDL.  Recommendations: 1) aspirin 81 mg and Plavix 75 mg daily x 3 weeks 2) consider atorvastatin 10 mg nightly given history of statin intolerance 3) PT, OT, ST 4) neurology will be available as needed   Roland Rack, MD [facility] [phone]  If 7pm- 7am, please page neurology on call as listed in [place].

## 2022-08-12 NOTE — Progress Notes (Signed)
Pt ambulated to the rest room for urination with the assistance of walker.gait is unsteady. Pt wants to have purewick at bed time.

## 2022-08-12 NOTE — Evaluation (Signed)
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[place]) 08/11/2022   Tobacco use disorder 08/31/2016   Ischemic colitis ([place]) 08/31/2016   Melena 07/17/2016   Acute GI bleeding    GERD (gastroesophageal reflux disease) 08/17/2015   History of vaginal discharge 08/17/2015   Menopause 08/17/2015   Urethral caruncle 08/17/2015   Vaginal atrophy 08/17/2015   Chronic anxiety 08/17/2015   Chronic obstructive pulmonary disease ([place]) 08/17/2015   Benign essential HTN 08/06/2014   Absolute anemia 02/09/2014   Past Medical History:  Past Medical History:  Diagnosis Date   Abdominal mass    Actinic keratosis    Atrophic vaginitis    Colitis, ischemic (HCC)    GERD (gastroesophageal reflux disease)    Pelvic pain in female    Pyometra    Skin cancer ~2/21   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 George Ina and pt referred to [facility] for txt   Squamous cell carcinoma of skin 10/20/2018   L hand dorsum   Squamous cell carcinoma of skin 07/21/2018   R pretibial below knww   Squamous cell carcinoma of skin 12/29/2014   SCC IS L dorsum foot   Squamous cell carcinoma of skin 08/17/2014   L lat calf   Squamous cell carcinoma of skin 04/01/2014   L upper arm   Squamous cell carcinoma of skin 05/18/2009   L dorsal hand   Squamous cell carcinoma of skin 10/02/2021   L mid pretibia, [facility]   Squamous cell carcinoma of skin 06/11/2022   left foot dorsum, [facility]   Squamous cell carcinoma of skin 07/31/2022   L dorsum foot, recurrent, needs Mohs with Dr. Manley Mason   Uterine mass    Past Surgical History:   Past Surgical History:  Procedure Laterality Date   adenoids     cataract surgery     CHOLECYSTECTOMY     TONSILLECTOMY     HPI:  Per H&P "Allison Hampton is a 88 y.o. 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 pylori, who presents to the ED with complaints of bilateral lower extremity weakness.     Ms. Greenwalt states she was in her normal state of health when she awoke this morning at 2 AM to use the restroom.  When she attempted to walk, she realized that her bilateral feet were very heavy and she was experiencing difficulty walking.  Due to this, she required the use of her cane and had to hold onto the wall to walk.  She states the weakness was worse from her knee down bilaterally and equally.  She was able to get around her home with a shuffling gait.  Later in the morning when she was trying to get ready, she realized she was having difficulty holding onto her [name] due to finger weakness.  Patient's daughter at bedside, Allison Hampton, states that patient's voice also sounds altered.     Mr. Newborn denies any history of prior CVA. She takes daily Aspirin.  She denies any other complaints  at this time including shortness of breath, chest pain, palpitations, recent illness, fever, chills, nausea, vomiting, diarrhea." MRI/MRA "1. 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  2. No flow signal is seen in the left vertebral artery from its  origin through the distal V4 segment, where there is likely  retrograde flow. A CTA head and neck is recommended for further  evaluation.  3. No other hemodynamically significant stenosis in the head or  neck.  4. Outpouching from the left cavernous ICA measures 3 x 3 x 2 mm,  concerning for a small aneurysm."   Assessment / Plan / Recommendation Clinical Impression  Pt seen for cognitive-communication  evaluation. Pt alert, pleasant, and cooperative. Low vision due to macular degeneration. HOH. Daughter present. Both pt and daughter endorse pt is at or near speech/language and cognitive baseline.   Evaluation completed via informal means. Pt demonstrated intact basic speech/language/cognitive ability. Pt A&Ox4. Pt demonstrated intact auditory comprehension and verbal expression. Pt benefited from occasional repetition of stimuli given pt's hearing status. Pt with mildly hoarse vocal quality and hyponasality which pt and daughter endorse are baseline. Very minimal and occasional articulatory imprecision noted; however, limited-to-no impact on functional communication.   Pt did note difficulty swallowing "dry" solids (particularly meat) PTA. SLP amended diet to reflect pt's preference for extra gravies, sauces, condiments to moisten solids. Pt and daughter encouraged to asked for SLP consult for swallowing if difficulty worsens or pt notices new trouble swallowing.   Pt and pt's daughter made aware of results, recommendations, and SLP POC.     SLP Assessment  SLP Recommendation/Assessment: Patient does not need any further Speech [place] [facility] SLP Visit Diagnosis: Dysarthria and anarthria (R47.1)    Recommendations for follow up therapy are one component of a multi-disciplinary discharge planning process, led by the attending physician.  Recommendations may be updated based on patient status, additional functional criteria and insurance authorization.    Follow Up Recommendations  No SLP follow up    Assistance Recommended at Discharge  Intermittent Supervision/Assistance  Functional Status Assessment Patient has not had a recent decline in their functional status        SLP Evaluation Cognition  Overall Cognitive Status: Within Functional Limits for tasks assessed Arousal/Alertness: Awake/alert Orientation Level: Oriented X4 Memory: Appears intact Awareness: Appears  intact Problem Solving: Appears intact Safety/Judgment: Appears intact       Comprehension  Auditory Comprehension Overall Auditory Comprehension: Appears within functional limits for tasks assessed Yes/No Questions: Within Functional Limits Commands: Within Functional Limits Conversation: Complex Interfering Components: Hearing EffectiveTechniques: Extra processing time;Increased volume    Expression Expression Primary Mode of Expression: Verbal Verbal Expression Overall Verbal Expression: Appears within functional limits for tasks assessed Initiation: No impairment Automatic Speech:  (WFL) Level of Generative/Spontaneous Verbalization: Conversation Repetition: No impairment Naming: No impairment Pragmatics: No impairment   Oral / Motor  Oral Motor/Sensory Function Overall Oral Motor/Sensory Function: Within functional limits Motor Speech Overall Motor Speech: Appears within functional limits for tasks assessed Respiration: Within functional limits Phonation: Hoarse Resonance: Hyponasality Articulation: Impaired Level of Impairment:  (across all levels) Intelligibility: Intelligible Motor Planning: Witnin functional limits           Cherrie Gauze, M.S., [facility] [phone] ([place])  Quintella Baton 08/12/2022, 2:36 PM

## 2022-08-12 NOTE — TOC Initial Note (Signed)
Transition of Care [facility]) - Initial/Assessment Note    Patient Details  Name: Allison Hampton MRN: [id number] Date of Birth: 29-Jun-1928  [facility] CM/SW Contact:    L[address]ta [address], [place] Phone Number: 08/12/2022, 4:13 PM  Clinical Narrative:                 CSW attempted to reach pt's daughter to discuss pt's PT/OT recommendations of HH PT/OT, phone went to vm, vm left.         Patient Goals and CMS Choice        Expected Discharge Plan and Services                                                Prior Living Arrangements/Services                       Activities of Daily Living Home Assistive Devices/Equipment: Cane (specify quad or straight), Walker (specify type) ADL Screening (condition at time of admission) Patient's cognitive ability adequate to safely complete daily activities?: Yes Is the patient deaf or have difficulty hearing?: Yes Does the patient have difficulty seeing, even when wearing glasses/contacts?: Yes Does the patient have difficulty concentrating, remembering, or making decisions?: No Patient able to express need for assistance with ADLs?: Yes Does the patient have difficulty dressing or bathing?: No Independently performs ADLs?: Yes (appropriate for developmental age) Does the patient have difficulty walking or climbing stairs?: Yes Weakness of Legs: Both Weakness of Arms/Hands: None  Permission Sought/Granted                  Emotional Assessment              Admission diagnosis:  Dysarthria [R47.1] Acute CVA (cerebrovascular accident) [facility]) [I63.9] Patient Active Problem List   Diagnosis Date Noted   Acute CVA (cerebrovascular accident) ([place]) 08/12/2022   TIA (transient ischemic attack) 08/11/2022   Chronic hyponatremia 08/11/2022   Abnormal echocardiogram findings without diagnosis 08/11/2022   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[place]) 08/11/2022   Tobacco use disorder 08/31/2016   Ischemic colitis  ([place]) 08/31/2016   Melena 07/17/2016   Acute GI bleeding    GERD (gastroesophageal reflux disease) 08/17/2015   History of vaginal discharge 08/17/2015   Menopause 08/17/2015   Urethral caruncle 08/17/2015   Vaginal atrophy 08/17/2015   Chronic anxiety 08/17/2015   Chronic obstructive pulmonary disease ([place]) 08/17/2015   Benign essential HTN 08/06/2014   Absolute anemia 02/09/2014   PCP:  Derinda Late, MD Pharmacy:   [place], [place] - [address] [place] [address] Phone: [phone] Fax: [phone]     Social Determinants of Health (SDOH) Interventions    Readmission Risk Interventions     No data to display

## 2022-08-13 DIAGNOSIS — I6302 Cerebral infarction due to thrombosis of basilar artery: Secondary | ICD-10-CM

## 2022-08-13 LAB — HEMOGLOBIN A1C
Hgb A1c MFr Bld: 5.5 % (ref 4.8–5.6)
Mean Plasma Glucose: 111 mg/dL

## 2022-08-13 MED ORDER — CLOPIDOGREL BISULFATE 75 MG PO TABS: 75.0000 mg | ORAL_TABLET | Freq: Every day | ORAL | 0 refills | Status: DC

## 2022-08-13 MED ORDER — ATORVASTATIN CALCIUM 10 MG PO TABS: 10.0000 mg | ORAL_TABLET | Freq: Every day | ORAL | 0 refills | Status: AC

## 2022-08-13 NOTE — Progress Notes (Signed)
Patient being discharged home. PIV removed. Went over discharge instructions and medications with patient and patients daughter. Both agreed they understood and all questions were answered. Patients equipment was delivered to room. Patient going home POV with daughter.

## 2022-08-13 NOTE — Discharge Summary (Signed)
Allison Hampton YWV:[id number] DOB: October 14, 1927 DOA: 08/11/2022  PCP: Derinda Late, MD  Admit date: 08/11/2022  Discharge date: 08/13/2022  Admitted From: home   Disposition:  home with home health    Recommendations for Outpatient Follow-up:   Follow up with PCP in 1-2 weeks  Home Health: PT/OT   Equipment/Devices: rolling walker  Consultations: neurology Discharge Condition: stable   CODE STATUS: DNR   Diet Recommendation: Heart Healthy   Diet Order             Diet - low sodium heart healthy           Diet Heart Room service appropriate? Yes; Fluid consistency: Thin  Diet effective now                    Chief Complaint  Patient presents with   Weakness     Brief history of present illness from the day of admission and additional interim summary     Allison Hampton is a 88 y.o. 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 pylori, who presents to the ED with complaints of bilateral lower extremity weakness.   [name] states she was in her normal state of health when she awoke this morning at 2 AM to use the restroom.  When she attempted to walk, she realized that her bilateral feet were very heavy and she was experiencing difficulty walking.  Due to this, she required the use of her cane and had to hold onto the wall to walk.  She states the weakness was worse from her knee down bilaterally and equally.  She was able to get around her home with a shuffling gait.  Later in the morning when she was trying to get ready, she realized she was having difficulty holding onto her [name] due to finger weakness.  Patient's  daughter at bedside, Allison Hampton, states that patient's voice also sounds altered.                                                                  Hospital Course   Patient was admitted for concern for acute stroke.  Patient was seen by neurology who note that MRI was positive for small vessel ischemic infarct in the pons.  Echocardiogram was without any embolic source, showed normal EF with grade 1 diastolic dysfunction and severe MR.  Telemetry monitoring, patient remained in normal sinus rhythm without evidence of atrial fibrillation.  MRI showed no flow signal in the left vertebral artery from its origin through the distal V4 segment.  Patient was allowed permissive hypertension and was started on Plavix in addition to the  aspirin that she was on at home.  Patient was evaluated by physical therapy and Occupational Therapy who recommended patient be discharged home with home health PT to be triggered.  Patient is now discharged home on aspirin and Plavix x 21 days.  Patient states she has a history of GI bleed and we had a long discussion about the possibility of GI bleed while on aspirin and the Plavix.  Patient is continued on Prilosec and knows to seek medical care if any concern for bleeding at home.  She is also started on atorvastatin 10 mg p.o. at bedtime however patient states she has had difficulty with this medication in the past.  We had a long discussion about risks and benefits of taking versus not taking the .  Patient to discuss this with her PCP with shared decision making about lipid management.  Patient may benefit from [place] inhibitors, she can be referred to endocrinology in outpatient if warranted.   Discharge diagnosis     Principal Problem:   Stroke [facility]) Active Problems:   Benign essential HTN   Chronic hyponatremia   Chronic obstructive pulmonary disease (HCC)   Abnormal echocardiogram findings without diagnosis   Dilation of aorta [facility])    Discharge instructions     Discharge Instructions     Call MD for:  persistant dizziness or light-headedness   Complete by: As directed    Diet - low sodium heart healthy   Complete by: As directed    Discharge instructions   Complete by: As directed    To help prevent strokes in the future, neurology recommends that you continue to take aspirin 81 mg every day as you have been doing.  And you take Plavix 1 tablet daily for 21 days.  It is important that you continue taking your Prilosec to prevent ulcers in your stomach, however do not take the Prilosec and the Plavix at the same time.  For example, if you are taking the Plavix in the morning, take the Prilosec in the afternoon or in the evening.  Also, as we discussed, I have prescribed prescribed a very low dose statin, atorvastatin 10 mg as it is important to take for stroke prevention in the future.  However as we discussed, since you have had a bad reaction to the statin before, it is up to you whether or not you want to follow this recommendation.  If you do choose to take this medication, I recommend that you ask your PCP to check your liver function tests in 1 week.  They call you to see how you are doing on the statin whether you want to continue to take it.   Increase activity slowly   Complete by: As directed    Walker rolling   Complete by: As directed        Discharge Medications   Allergies as of 08/13/2022       Reactions   Cetirizine Other (See Comments)   tachycardia   Ciprofloxacin    Codeine    Flagyl [metronidazole]    Iodinated Contrast Media    Moxifloxacin Hcl In Nacl Other (See Comments)   hallucination   Penicillins    Has patient had a PCN reaction causing immediate rash, facial/tongue/throat swelling, SOB or lightheadedness with hypotension: yes Has patient had a PCN reaction causing severe rash involving mucus membranes or skin necrosis: yes Has patient had a PCN reaction that required hospitalization no Has patient had a PCN  reaction  occurring within the last 10 years: no If all of the above answers are "NO", then may proceed with Cephalosporin use.   Shellfish Allergy    Statins Other (See Comments)   myalgias, unspecified   Sulfa Antibiotics         Medication List     TAKE these medications    aspirin 81 MG chewable tablet Chew by mouth daily.   atenolol 25 MG tablet Commonly known as: TENORMIN Take 1 tablet by mouth daily.   atorvastatin 10 MG tablet Commonly known as: Lipitor Take 1 tablet (10 mg total) by mouth daily.   CENTRUM SILVER PO Take 1 tablet by mouth daily.   PRESERVISION AREDS PO Take 1 tablet by mouth daily.   clopidogrel 75 MG tablet Commonly known as: Plavix Take 1 tablet (75 mg total) by mouth daily for 21 days.   cyanocobalamin 1000 MCG tablet Commonly known as: VITAMIN B12 Take 1,000 mcg by mouth daily.   diazepam 5 MG tablet Commonly known as: VALIUM Take 0.5 mg by mouth daily.   docusate calcium 240 MG capsule Commonly known as: SURFAK Take by mouth daily as needed.   ferrous sulfate 325 (65 FE) MG tablet Take 325 mg by mouth daily with breakfast.   fluticasone 50 MCG/ACT nasal spray Commonly known as: FLONASE fluticasone propionate 50 mcg/actuation nasal spray,suspension   GNP Probiotic Colon Support Caps Take by mouth.   hydrochlorothiazide 12.5 MG tablet Commonly known as: HYDRODIURIL TAKE ONE TABLET EVERY DAY   latanoprost 0.005 % ophthalmic solution Commonly known as: XALATAN Place 1 drop into both eyes at bedtime.   loratadine 10 MG tablet Commonly known as: CLARITIN Take 10 mg by mouth daily.   losartan 50 MG tablet Commonly known as: COZAAR Take 50 mg by mouth daily.   mupirocin ointment 2 % Commonly known as: BACTROBAN Apply 1-3 times per day to sores on legs.   mupirocin ointment 2 % Commonly known as: BACTROBAN Apply to wounds qd-bid   omeprazole 20 MG capsule Commonly known as: PRILOSEC Take 20 mg by mouth daily.                Durable Medical Equipment  (From admission, onward)           Start     Ordered   08/13/22 1157  For home use only DME Walker rolling  Once       Question Answer Comment  Walker: With [place] Wheels   Patient needs a walker to treat with the following condition Weakness generalized      08/13/22 1156   08/13/22 1157  For home use only DME 3 n 1  Once        08/13/22 1156              Major procedures and Radiology Reports - PLEASE review detailed and final reports thoroughly  -      ECHOCARDIOGRAM COMPLETE  Result Date: 08/12/2022    ECHOCARDIOGRAM REPORT   Patient Name:   RAIZA KIESEL Runde Date of Exam: 08/12/2022 Medical Rec #:  [id number]            Height:       65.0 in Accession #:    [id number]           Weight:       145.7 lb Date of Birth:  [birth year]           BSA:  1.729 m Patient Age:    88 years             BP:           133/81 mmHg Patient Gender: F                    HR:           61 bpm. Exam Location:  [facility] Procedure: 2D Echo, Cardiac Doppler and Color Doppler Indications:     Stroke  History:         Patient has no prior history of Echocardiogram examinations.                  TIA and COPD; Risk Factors:Hypertension.  Sonographer:     L. Thornton-Maynard Referring Phys:  [id number] Jose Persia Diagnosing Phys: Ida Rogue MD IMPRESSIONS  1. Left ventricular ejection fraction, by estimation, is 55 to 60%. The left ventricle has normal function. The left ventricle has no regional wall motion abnormalities. There is mild left ventricular hypertrophy. Left ventricular diastolic parameters are consistent with Grade I diastolic dysfunction (impaired relaxation).  2. Right ventricular systolic function is normal. The right ventricular size is normal. There is normal pulmonary artery systolic pressure. The estimated right ventricular systolic pressure is 81.8 mmHg.  3. Left atrial size was severely dilated.  4. The mitral valve is normal in  structure. Moderate to severe mitral valve regurgitation. No evidence of mitral stenosis. There is moderate holosystolic prolapse of multiple scallops of the posterior leaflet of the mitral valve.  5. Tricuspid valve regurgitation is mild to moderate.  6. The aortic valve is tricuspid. There is moderate calcification of the aortic valve. Aortic valve regurgitation is not visualized. Aortic valve sclerosis/calcification is present, without any evidence of aortic stenosis.  7. The inferior vena cava is normal in size with greater than 50% respiratory variability, suggesting right atrial pressure of 3 mmHg. FINDINGS  Left Ventricle: Left ventricular ejection fraction, by estimation, is 55 to 60%. The left ventricle has normal function. The left ventricle has no regional wall motion abnormalities. The left ventricular internal cavity size was normal in size. There is  mild left ventricular hypertrophy. Left ventricular diastolic parameters are consistent with Grade I diastolic dysfunction (impaired relaxation). Right Ventricle: The right ventricular size is normal. No increase in right ventricular wall thickness. Right ventricular systolic function is normal. There is normal pulmonary artery systolic pressure. The tricuspid regurgitant velocity is 2.84 m/s, and  with an assumed right atrial pressure of 3 mmHg, the estimated right ventricular systolic pressure is 29.9 mmHg. Left Atrium: Left atrial size was severely dilated. Right Atrium: Right atrial size was normal in size. Pericardium: There is no evidence of pericardial effusion. Mitral Valve: The mitral valve is normal in structure. There is moderate holosystolic prolapse of multiple scallops of the posterior leaflet of the mitral valve. There is moderate thickening of the mitral valve leaflet(s). There is mild calcification of the mitral valve leaflet(s). Mild mitral annular calcification. Moderate to severe mitral valve regurgitation, with eccentric medially  directed jet. No evidence of mitral valve stenosis. MV peak gradient, 9.4 mmHg. The mean mitral valve gradient is 4.0 mmHg. Tricuspid Valve: The tricuspid valve is normal in structure. Tricuspid valve regurgitation is mild to moderate. No evidence of tricuspid stenosis. Aortic Valve: The aortic valve is tricuspid. There is moderate calcification of the aortic valve. Aortic valve regurgitation is not visualized. Aortic valve sclerosis/calcification is present, without any evidence of  aortic stenosis. Aortic valve mean gradient measures 5.0 mmHg. Aortic valve peak gradient measures 8.8 mmHg. Aortic valve area, by VTI measures 1.84 cm. Pulmonic Valve: The pulmonic valve was normal in structure. Pulmonic valve regurgitation is not visualized. No evidence of pulmonic stenosis. Aorta: The aortic root is normal in size and structure. Venous: The inferior vena cava is normal in size with greater than 50% respiratory variability, suggesting right atrial pressure of 3 mmHg. IAS/Shunts: No atrial level shunt detected by color flow Doppler.  LEFT VENTRICLE PLAX 2D LVIDd:         4.40 cm     Diastology LVIDs:         2.70 cm     LV e' medial:    5.66 cm/s LV PW:         1.10 cm     LV E/e' medial:  24.7 LV IVS:        1.20 cm     LV e' lateral:   6.09 cm/s LVOT diam:     1.90 cm     LV E/e' lateral: 23.0 LV SV:         67 LV SV Index:   39 LVOT Area:     2.84 cm  LV Volumes (MOD) LV vol d, MOD A2C: 58.6 ml LV vol d, MOD A4C: 76.6 ml LV vol s, MOD A2C: 15.1 ml LV vol s, MOD A4C: 18.9 ml LV SV MOD A2C:     43.5 ml LV SV MOD A4C:     76.6 ml LV SV MOD BP:      50.0 ml RIGHT VENTRICLE RV Basal diam:  3.70 cm RV S prime:     19.30 cm/s TAPSE (M-mode): 3.2 cm LEFT ATRIUM              Index        RIGHT ATRIUM           Index LA diam:        3.40 cm  1.97 cm/m   RA Area:     12.60 cm LA Vol (A2C):   114.0 ml 65.93 ml/m  RA Volume:   27.50 ml  15.90 ml/m LA Vol (A4C):   72.4 ml  41.87 ml/m LA Biplane Vol: 98.1 ml  56.73 ml/m   AORTIC VALVE                     PULMONIC VALVE AV Area (Vmax):    1.95 cm      PV Vmax:          0.82 m/s AV Area (Vmean):   1.83 cm      PV Peak grad:     2.7 mmHg AV Area (VTI):     1.84 cm      PR End Diast Vel: 1.84 msec AV Vmax:           148.00 cm/s AV Vmean:          101.450 cm/s AV VTI:            0.363 m AV Peak Grad:      8.8 mmHg AV Mean Grad:      5.0 mmHg LVOT Vmax:         102.00 cm/s LVOT Vmean:        65.500 cm/s LVOT VTI:          0.236 m LVOT/AV VTI ratio: 0.65  AORTA Ao Root diam: 3.50 cm Ao Asc diam:  3.60 cm MITRAL VALVE                  TRICUSPID VALVE MV Area (PHT): 3.27 cm       TR Peak grad:   32.3 mmHg MV Area VTI:   1.18 cm       TR Vmax:        284.00 cm/s MV Peak grad:  9.4 mmHg MV Mean grad:  4.0 mmHg       SHUNTS MV Vmax:       1.53 m/s       Systemic VTI:  0.24 m MV Vmean:      99.5 cm/s      Systemic Diam: 1.90 cm MV Decel Time: 232 msec MR Peak grad:    125.7 mmHg MR Mean grad:    85.5 mmHg MR Vmax:         560.50 cm/s MR Vmean:        441.0 cm/s MR PISA:         1.90 cm MR PISA Eff ROA: 13 mm MR PISA Radius:  0.55 cm MV E velocity: 140.00 cm/s MV A velocity: 114.00 cm/s MV E/A ratio:  1.23 Ida Rogue MD Electronically signed by Ida Rogue MD Signature Date/Time: 08/12/2022/11:54:35 AM    Final    MR BRAIN WO CONTRAST  Result Date: 08/11/2022 CLINICAL DATA:  Stroke suspected, generalized weakness, lower extremity numbness EXAM: MRI HEAD WITHOUT CONTRAST MRA HEAD WITHOUT CONTRAST MRA NECK WITHOUTCONTRAST TECHNIQUE: Multiplanar, multi-echo pulse sequences of the brain and surrounding structures were acquired without intravenous contrast. Angiographic images of the Circle of Willis were acquired using MRA technique without intravenous contrast. Angiographic images of the neck were acquired using MRA technique without intravenous contrast. Carotid stenosis measurements (when applicable) are obtained utilizing NASCET criteria, using the distal internal carotid  diameter as the denominator. COMPARISON:  CT head 08/11/2022 FINDINGS: MRI HEAD FINDINGS Brain: Focus of restricted diffusion with ADC correlate in the left pons, which measures 8 x 5 x 4 mm (series 5002, image 18 and 7002, image 19). No acute hemorrhage, mass, mass effect, or midline shift. No hydrocephalus or extra-axial collection. Cerebral volume is normal for age. T2 hyperintense signal in the periventricular white matter, likely the sequela of mild-to-moderate chronic small vessel ischemic disease. Normal pituitary and craniocervical junction. Vascular: Normal arterial flow voids. Please see MRA findings below. Skull and upper cervical spine: Normal marrow signal. Sinuses/Orbits: Clear paranasal sinuses. Status post bilateral lens replacements. Other: The mastoids are well aerated. MRA HEAD FINDINGS Anterior circulation: Both internal carotid arteries are patent to the termini, without significant stenosis. Outpouching from the left cavernous ICA measures 3 x 3 x 2 mm (series 10001, image 92). A1 segments patent. Normal anterior communicating artery. Anterior cerebral arteries are patent to their distal aspects. No M1 stenosis or occlusion. Normal MCA bifurcations. Distal MCA branches perfused and symmetric. Posterior circulation: The left vertebral artery does not display flow signal proximally, with reconstitution in the distal left V4 (series 1001, image 65), likely retrograde. The right vertebral artery is patent to the vertebrobasilar junction without significant stenosis. Basilar patent to its distal aspect. Superior cerebellar arteries patent bilaterally. Patent P1 segments. PCAs perfused to their distal aspects without stenosis. The bilateral posterior communicating arteries are patent. Anatomic variants: None significant Superiorly directed MRA NECK FINDINGS Aortic arch: Not well visualized due to noncontrast technique and motion. Right carotid system: No evidence of dissection, occlusion, or  hemodynamically significant stenosis (greater than 50%).  Left carotid system: No evidence of dissection, occlusion, or hemodynamically significant stenosis (greater than 50%). Vertebral arteries: No flow signal is seen in the left vertebral artery from its origin through the skull base. The right vertebral artery appears patent, without significant stenosis, dissection, or occlusion. Other: None IMPRESSION: 1. 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 2. No flow signal is seen in the left vertebral artery from its origin through the distal V4 segment, where there is likely retrograde flow. A CTA head and neck is recommended for further evaluation. 3. No other hemodynamically significant stenosis in the head or neck. 4. Outpouching from the left cavernous ICA measures 3 x 3 x 2 mm, concerning for a small aneurysm. These results will be called to the ordering clinician or representative by the Radiologist Assistant, and communication documented in the PACS or [employer]. Electronically Signed   By: Merilyn Baba M.D.   On: 08/11/2022 21:04   MR ANGIO HEAD WO CONTRAST  Result Date: 08/11/2022 CLINICAL DATA:  Stroke suspected, generalized weakness, lower extremity numbness EXAM: MRI HEAD WITHOUT CONTRAST MRA HEAD WITHOUT CONTRAST MRA NECK WITHOUTCONTRAST TECHNIQUE: Multiplanar, multi-echo pulse sequences of the brain and surrounding structures were acquired without intravenous contrast. Angiographic images of the Circle of Willis were acquired using MRA technique without intravenous contrast. Angiographic images of the neck were acquired using MRA technique without intravenous contrast. Carotid stenosis measurements (when applicable) are obtained utilizing NASCET criteria, using the distal internal carotid diameter as the denominator. COMPARISON:  CT head 08/11/2022 FINDINGS: MRI HEAD FINDINGS Brain: Focus of restricted diffusion with ADC correlate in the left pons, which measures 8 x 5 x 4 mm (series 5002, image 18 and  7002, image 19). No acute hemorrhage, mass, mass effect, or midline shift. No hydrocephalus or extra-axial collection. Cerebral volume is normal for age. T2 hyperintense signal in the periventricular white matter, likely the sequela of mild-to-moderate chronic small vessel ischemic disease. Normal pituitary and craniocervical junction. Vascular: Normal arterial flow voids. Please see MRA findings below. Skull and upper cervical spine: Normal marrow signal. Sinuses/Orbits: Clear paranasal sinuses. Status post bilateral lens replacements. Other: The mastoids are well aerated. MRA HEAD FINDINGS Anterior circulation: Both internal carotid arteries are patent to the termini, without significant stenosis. Outpouching from the left cavernous ICA measures 3 x 3 x 2 mm (series 10001, image 92). A1 segments patent. Normal anterior communicating artery. Anterior cerebral arteries are patent to their distal aspects. No M1 stenosis or occlusion. Normal MCA bifurcations. Distal MCA branches perfused and symmetric. Posterior circulation: The left vertebral artery does not display flow signal proximally, with reconstitution in the distal left V4 (series 1001, image 65), likely retrograde. The right vertebral artery is patent to the vertebrobasilar junction without significant stenosis. Basilar patent to its distal aspect. Superior cerebellar arteries patent bilaterally. Patent P1 segments. PCAs perfused to their distal aspects without stenosis. The bilateral posterior communicating arteries are patent. Anatomic variants: None significant Superiorly directed MRA NECK FINDINGS Aortic arch: Not well visualized due to noncontrast technique and motion. Right carotid system: No evidence of dissection, occlusion, or hemodynamically significant stenosis (greater than 50%). Left carotid system: No evidence of dissection, occlusion, or hemodynamically significant stenosis (greater than 50%). Vertebral arteries: No flow signal is seen in the  left vertebral artery from its origin through the skull base. The right vertebral artery appears patent, without significant stenosis, dissection, or occlusion. Other: None IMPRESSION: 1. 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 2. No flow signal is seen in the  left vertebral artery from its origin through the distal V4 segment, where there is likely retrograde flow. A CTA head and neck is recommended for further evaluation. 3. No other hemodynamically significant stenosis in the head or neck. 4. Outpouching from the left cavernous ICA measures 3 x 3 x 2 mm, concerning for a small aneurysm. These results will be called to the ordering clinician or representative by the Radiologist Assistant, and communication documented in the PACS or [employer]. Electronically Signed   By: Merilyn Baba M.D.   On: 08/11/2022 21:04   MR ANGIO NECK WO CONTRAST  Result Date: 08/11/2022 CLINICAL DATA:  Stroke suspected, generalized weakness, lower extremity numbness EXAM: MRI HEAD WITHOUT CONTRAST MRA HEAD WITHOUT CONTRAST MRA NECK WITHOUTCONTRAST TECHNIQUE: Multiplanar, multi-echo pulse sequences of the brain and surrounding structures were acquired without intravenous contrast. Angiographic images of the Circle of Willis were acquired using MRA technique without intravenous contrast. Angiographic images of the neck were acquired using MRA technique without intravenous contrast. Carotid stenosis measurements (when applicable) are obtained utilizing NASCET criteria, using the distal internal carotid diameter as the denominator. COMPARISON:  CT head 08/11/2022 FINDINGS: MRI HEAD FINDINGS Brain: Focus of restricted diffusion with ADC correlate in the left pons, which measures 8 x 5 x 4 mm (series 5002, image 18 and 7002, image 19). No acute hemorrhage, mass, mass effect, or midline shift. No hydrocephalus or extra-axial collection. Cerebral volume is normal for age. T2 hyperintense signal in the periventricular white matter, likely the  sequela of mild-to-moderate chronic small vessel ischemic disease. Normal pituitary and craniocervical junction. Vascular: Normal arterial flow voids. Please see MRA findings below. Skull and upper cervical spine: Normal marrow signal. Sinuses/Orbits: Clear paranasal sinuses. Status post bilateral lens replacements. Other: The mastoids are well aerated. MRA HEAD FINDINGS Anterior circulation: Both internal carotid arteries are patent to the termini, without significant stenosis. Outpouching from the left cavernous ICA measures 3 x 3 x 2 mm (series 10001, image 92). A1 segments patent. Normal anterior communicating artery. Anterior cerebral arteries are patent to their distal aspects. No M1 stenosis or occlusion. Normal MCA bifurcations. Distal MCA branches perfused and symmetric. Posterior circulation: The left vertebral artery does not display flow signal proximally, with reconstitution in the distal left V4 (series 1001, image 65), likely retrograde. The right vertebral artery is patent to the vertebrobasilar junction without significant stenosis. Basilar patent to its distal aspect. Superior cerebellar arteries patent bilaterally. Patent P1 segments. PCAs perfused to their distal aspects without stenosis. The bilateral posterior communicating arteries are patent. Anatomic variants: None significant Superiorly directed MRA NECK FINDINGS Aortic arch: Not well visualized due to noncontrast technique and motion. Right carotid system: No evidence of dissection, occlusion, or hemodynamically significant stenosis (greater than 50%). Left carotid system: No evidence of dissection, occlusion, or hemodynamically significant stenosis (greater than 50%). Vertebral arteries: No flow signal is seen in the left vertebral artery from its origin through the skull base. The right vertebral artery appears patent, without significant stenosis, dissection, or occlusion. Other: None IMPRESSION: 1. 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 2. No  flow signal is seen in the left vertebral artery from its origin through the distal V4 segment, where there is likely retrograde flow. A CTA head and neck is recommended for further evaluation. 3. No other hemodynamically significant stenosis in the head or neck. 4. Outpouching from the left cavernous ICA measures 3 x 3 x 2 mm, concerning for a small aneurysm. These results will be called to the  ordering clinician or representative by the Radiologist Assistant, and communication documented in the PACS or [employer]. Electronically Signed   By: Merilyn Baba M.D.   On: 08/11/2022 21:04   CT HEAD WO CONTRAST (5MM)  Result Date: 08/11/2022 CLINICAL DATA:  88 year old female with generalized weakness since 0300 hours. Lower extremity numbness. EXAM: CT HEAD WITHOUT CONTRAST TECHNIQUE: Contiguous axial images were obtained from the base of the skull through the vertex without intravenous contrast. RADIATION DOSE REDUCTION: This exam was performed according to the departmental dose-optimization program which includes automated exposure control, adjustment of the mA and/or kV according to patient size and/or use of iterative reconstruction technique. COMPARISON:  Head CT 03/12/2022. FINDINGS: Brain: Stable cerebral volume. No midline shift, mass effect, or evidence of intracranial mass lesion. No ventriculomegaly. No acute intracranial hemorrhage identified. Chronic vascular/dystrophic calcifications in the bilateral basal ganglia. Stable gray-white matter differentiation throughout the brain. No cortically based acute infarct identified. No cortical encephalomalacia identified. Vascular: Calcified atherosclerosis at the skull base. No suspicious intracranial vascular hyperdensity. Skull: No acute osseous abnormality identified. Sinuses/Orbits: Visualized paranasal sinuses and mastoids are stable and well aerated. Other: No acute orbit or scalp soft tissue finding. IMPRESSION: Stable since July and largely  negative for age. No acute intracranial abnormality. Electronically Signed   By: Genevie Ann M.D.   On: 08/11/2022 12:01   DG Chest 2 View  Result Date: 08/11/2022 CLINICAL DATA:  Acute onset of chest pain and generalized weakness EXAM: CHEST - 2 VIEW COMPARISON:  Chest radiograph dated 03/12/2022 FINDINGS: Mildly hyperinflated lungs. No focal consolidations. No pleural effusion or pneumothorax. The heart size and mediastinal contours are within normal limits. The visualized skeletal structures are unremarkable. IMPRESSION: No active cardiopulmonary disease. Electronically Signed   By: Darrin Nipper M.D.   On: 08/11/2022 11:56    Micro Results    No results found for this or any previous visit (from the past 240 hour(s)).  Today   Subjective    Shelli Sessums feels much improved since admission.  Feels ready to go home.  Denies chest pain, shortness of breath or abdominal pain.  Feels they can take care of themselves with the resources they have at home.  Objective   Blood pressure (!) 141/65, pulse 72, temperature 97.8 F (36.6 C), resp. rate 18, height '5\' 5"'$  (1.651 m), weight 66.1 kg, SpO2 95 %.   Intake/Output Summary (Last 24 hours) at 08/13/2022 1330 Last data filed at 08/13/2022 0413 Gross per 24 hour  Intake --  Output 450 ml  Net -450 ml    Exam General: Patient appears well and in good spirits sitting up in bed in no acute distress.  Eyes: sclera anicteric, conjuctiva mild injection bilaterally CVS: S1-S2, regular  Respiratory:  decreased air entry bilaterally secondary to decreased inspiratory effort, rales at bases  GI: NABS, soft, NT  LE: No edema.  Neuro: A/O x 3,  Psych: patient is logical and coherent, judgement and insight appear normal, mood and affect appropriate to situation.    Data Review   CBC w Diff:  Lab Results  Component Value Date   WBC 3.9 (L) 08/12/2022   HGB 12.9 08/12/2022   HGB 9.6 (L) 12/06/2013   HCT 38.2 08/12/2022   HCT 29.7 (L)  12/06/2013   PLT 206 08/12/2022   PLT 360 12/06/2013   LYMPHOPCT 34 08/12/2022   LYMPHOPCT 40.8 12/06/2013   MONOPCT 10 08/12/2022   MONOPCT 6.2 12/06/2013   EOSPCT 2 08/12/2022  EOSPCT 1.2 12/06/2013   BASOPCT 1 08/12/2022   BASOPCT 0.6 12/06/2013    CMP:  Lab Results  Component Value Date   NA 134 (L) 08/12/2022   NA 135 (L) 12/06/2013   K 3.9 08/12/2022   K 3.5 12/06/2013   CL 101 08/12/2022   CL 101 12/06/2013   CO2 26 08/12/2022   CO2 27 12/06/2013   BUN 11 08/12/2022   BUN 7 12/06/2013   CREATININE 0.47 08/12/2022   CREATININE 0.62 12/06/2013   PROT 7.2 06/29/2019   PROT 7.6 12/04/2013   ALBUMIN 4.4 06/29/2019   ALBUMIN 2.5 (L) 12/04/2013   BILITOT 0.5 06/29/2019   BILITOT 0.2 12/04/2013   ALKPHOS 76 06/29/2019   ALKPHOS 116 12/04/2013   AST 25 06/29/2019   AST 19 12/04/2013   ALT 15 06/29/2019   ALT 16 12/04/2013  .   Total Time in preparing paper work, data evaluation and todays exam - 35 minutes  Vashti Hey M.D on 08/13/2022 at 1:30 PM  [facility]

## 2022-08-13 NOTE — Plan of Care (Signed)

## 2022-08-13 NOTE — Progress Notes (Addendum)
CSW called pt's dtr and lvm for a return call.  CSW also sent a text to same phone number asking for a call to discuss discharge plan for pt.   10:22:  Rec'd a text from dtr stating that her phone doesn't call out.  She was at hospital and requested CSW to call room.  She states that her mom is waiting to hear back from a friend about a [facility] service that she'd like to use.  Dtr will reach back out to CSW with that information later today.  12:01  Spoke with pt and dtr about HH services from [facility].  They're in agreement.  RW and 3-in-1 ordered by [employer].  CSW tried calling [employer] and left message for Ed Weeks to update on patient discharge. [place], [place]

## 2022-08-13 NOTE — Progress Notes (Signed)
Physical Therapy Treatment Patient Details Name: Allison Hampton MRN: [id number] DOB: 11-04-1927 Today's Date: 08/13/2022   History of Present Illness 88 yo female presents to the ED with complaints of bilateral lower extremity weakness. 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 Pt required supervision for bed mobility. Sit<>Stand with mod assist - supervision due to incorrect RW mechanics. Pt heavily educated on RW safety/mechanics and verbalized understanding. Pt ambulated 160 ft with RW, CGA, with no unsteadiness noted when challenged with horizontal and vertical head turns. Without AD unsteadiness was noted but pt able to self correct. Pt reported her left leg felt weak post ambulation and that her right knee bothers her at baseline. Pt would benefit from skilled physical therapy to address the listed deficits (see below) to increase independence with ADLs and function. Current recommendation is HHPT to return pt to PLOF and maximize safety in the home.      Recommendations for follow up therapy are one component of a multi-disciplinary discharge planning process, led by the attending physician.  Recommendations may be updated based on patient status, additional functional criteria and insurance authorization.  Follow Up Recommendations  Home health PT     Assistance Recommended at Discharge Intermittent Supervision/Assistance  Patient can return home with the following A little help with walking and/or transfers;A little help with bathing/dressing/bathroom;Assistance with cooking/housework;Assist for transportation;Help with stairs or ramp for entrance   Equipment Recommendations  Rolling walker (2 wheels);BSC/3in1    Recommendations for Other Services       Precautions / Restrictions  Precautions Precautions: Fall Restrictions Weight Bearing Restrictions: No     Mobility  Bed Mobility Overal bed mobility: Needs Assistance Bed Mobility: Supine to Sit     Supine to sit: Supervision          Transfers Overall transfer level: Needs assistance Equipment used: Rolling walker (2 wheels) Transfers: Sit to/from Stand Sit to Stand: Supervision, Mod assist           General transfer comment: mod asssit due to incorrect RW mechanics    Ambulation/Gait Ambulation/Gait assistance: Supervision Gait Distance (Feet): 160 Feet Assistive device: Rolling walker (2 wheels) Gait Pattern/deviations: WFL(Within Functional Limits), Trunk flexed           Stairs             Wheelchair Mobility    Modified Rankin (Stroke Patients Only)       Balance Overall balance assessment: Needs assistance Sitting-balance support: Feet supported Sitting balance-Leahy Scale: Good     Standing balance support: Bilateral upper extremity supported Standing balance-Leahy Scale: Good                              Cognition Arousal/Alertness: Awake/alert Behavior During Therapy: WFL for tasks assessed/performed Overall Cognitive Status: Within Functional Limits for tasks assessed                                          Exercises      General Comments        Pertinent Vitals/Pain Pain Assessment Pain Assessment: No/denies pain    Home Living  Prior Function            PT Goals (current goals can now be found in the care plan section) Acute Rehab PT Goals Patient Stated Goal: to return home PT Goal Formulation: With patient Time For Goal Achievement: 08/26/22 Potential to Achieve Goals: Good Progress towards PT goals: Progressing toward goals    Frequency    7X/week      PT Plan Current plan remains appropriate    Co-evaluation              AM-PAC PT "6 Clicks"  Mobility   Outcome Measure  Help needed turning from your back to your side while in a flat bed without using bedrails?: A Little Help needed moving from lying on your back to sitting on the side of a flat bed without using bedrails?: A Little Help needed moving to and from a bed to a chair (including a wheelchair)?: A Little Help needed standing up from a chair using your arms (e.g., wheelchair or bedside chair)?: A Little Help needed to walk in hospital room?: A Little Help needed climbing 3-5 steps with a railing? : A Little 6 Click Score: 18    End of Session Equipment Utilized During Treatment: Gait belt Activity Tolerance: Patient tolerated treatment well Patient left: in chair;with call bell/phone within reach;with family/visitor present;with chair alarm set Nurse Communication: Mobility status PT Visit Diagnosis: Unsteadiness on feet (R26.81);Other abnormalities of gait and mobility (R26.89);Muscle weakness (generalized) (M62.81)     Time: 4818-5909 PT Time Calculation (min) (ACUTE ONLY): 18 min  Charges:                        Claiborne Billings O'Daniel, SPT  08/13/2022, 12:11 PM

## 2022-08-13 NOTE — Progress Notes (Signed)
Patient is not able to walk the distance required to go the bathroom, or he/she is unable to safely negotiate stairs required to access the bathroom.  A 3in1 BSC will alleviate this problem   Allison Hampton, [place]

## 2022-08-16 ENCOUNTER — Other Ambulatory Visit: Payer: Self-pay

## 2022-08-16 ENCOUNTER — Inpatient Hospital Stay
Admission: EM | Admit: 2022-08-16 | Discharge: 2022-08-21 | DRG: 378 | Disposition: A | Discharge status: Home Health | Payer: Medicare Other | Attending: Internal Medicine | Admitting: Internal Medicine

## 2022-08-16 DIAGNOSIS — I110 Hypertensive heart disease with heart failure: Secondary | ICD-10-CM | POA: Diagnosis present

## 2022-08-16 DIAGNOSIS — J449 Chronic obstructive pulmonary disease, unspecified: Secondary | ICD-10-CM | POA: Diagnosis present

## 2022-08-16 DIAGNOSIS — Z91013 Allergy to seafood: Secondary | ICD-10-CM

## 2022-08-16 DIAGNOSIS — I639 Cerebral infarction, unspecified: Secondary | ICD-10-CM | POA: Diagnosis not present

## 2022-08-16 DIAGNOSIS — Z91041 Radiographic dye allergy status: Secondary | ICD-10-CM

## 2022-08-16 DIAGNOSIS — I5032 Chronic diastolic (congestive) heart failure: Secondary | ICD-10-CM | POA: Diagnosis present

## 2022-08-16 DIAGNOSIS — Z8249 Family history of ischemic heart disease and other diseases of the circulatory system: Secondary | ICD-10-CM

## 2022-08-16 DIAGNOSIS — F17210 Nicotine dependence, cigarettes, uncomplicated: Secondary | ICD-10-CM | POA: Diagnosis present

## 2022-08-16 DIAGNOSIS — Z8673 Personal history of transient ischemic attack (TIA), and cerebral infarction without residual deficits: Secondary | ICD-10-CM | POA: Diagnosis not present

## 2022-08-16 DIAGNOSIS — K5730 Diverticulosis of large intestine without perforation or abscess without bleeding: Secondary | ICD-10-CM | POA: Diagnosis present

## 2022-08-16 DIAGNOSIS — Z85828 Personal history of other malignant neoplasm of skin: Secondary | ICD-10-CM | POA: Diagnosis not present

## 2022-08-16 DIAGNOSIS — K641 Second degree hemorrhoids: Secondary | ICD-10-CM | POA: Diagnosis present

## 2022-08-16 DIAGNOSIS — D509 Iron deficiency anemia, unspecified: Secondary | ICD-10-CM | POA: Diagnosis not present

## 2022-08-16 DIAGNOSIS — Z882 Allergy status to sulfonamides status: Secondary | ICD-10-CM

## 2022-08-16 DIAGNOSIS — K449 Diaphragmatic hernia without obstruction or gangrene: Secondary | ICD-10-CM | POA: Diagnosis present

## 2022-08-16 DIAGNOSIS — K31819 Angiodysplasia of stomach and duodenum without bleeding: Secondary | ICD-10-CM | POA: Diagnosis not present

## 2022-08-16 DIAGNOSIS — E785 Hyperlipidemia, unspecified: Secondary | ICD-10-CM | POA: Diagnosis present

## 2022-08-16 DIAGNOSIS — K625 Hemorrhage of anus and rectum: Secondary | ICD-10-CM | POA: Diagnosis not present

## 2022-08-16 DIAGNOSIS — E871 Hypo-osmolality and hyponatremia: Secondary | ICD-10-CM | POA: Diagnosis present

## 2022-08-16 DIAGNOSIS — H9193 Unspecified hearing loss, bilateral: Secondary | ICD-10-CM | POA: Diagnosis present

## 2022-08-16 DIAGNOSIS — Z66 Do not resuscitate: Secondary | ICD-10-CM | POA: Diagnosis present

## 2022-08-16 DIAGNOSIS — Z885 Allergy status to narcotic agent status: Secondary | ICD-10-CM

## 2022-08-16 DIAGNOSIS — K5520 Angiodysplasia of colon without hemorrhage: Secondary | ICD-10-CM | POA: Diagnosis not present

## 2022-08-16 DIAGNOSIS — Z881 Allergy status to other antibiotic agents status: Secondary | ICD-10-CM | POA: Diagnosis not present

## 2022-08-16 DIAGNOSIS — Z7982 Long term (current) use of aspirin: Secondary | ICD-10-CM

## 2022-08-16 DIAGNOSIS — D62 Acute posthemorrhagic anemia: Secondary | ICD-10-CM | POA: Diagnosis present

## 2022-08-16 DIAGNOSIS — K5521 Angiodysplasia of colon with hemorrhage: Principal | ICD-10-CM | POA: Diagnosis present

## 2022-08-16 DIAGNOSIS — F419 Anxiety disorder, unspecified: Secondary | ICD-10-CM | POA: Diagnosis present

## 2022-08-16 DIAGNOSIS — Z79899 Other long term (current) drug therapy: Secondary | ICD-10-CM | POA: Diagnosis not present

## 2022-08-16 DIAGNOSIS — Z880 Allergy status to penicillin: Secondary | ICD-10-CM

## 2022-08-16 DIAGNOSIS — Z9049 Acquired absence of other specified parts of digestive tract: Secondary | ICD-10-CM

## 2022-08-16 DIAGNOSIS — I10 Essential (primary) hypertension: Secondary | ICD-10-CM | POA: Diagnosis not present

## 2022-08-16 DIAGNOSIS — K922 Gastrointestinal hemorrhage, unspecified: Secondary | ICD-10-CM

## 2022-08-16 DIAGNOSIS — K219 Gastro-esophageal reflux disease without esophagitis: Secondary | ICD-10-CM | POA: Diagnosis present

## 2022-08-16 DIAGNOSIS — I959 Hypotension, unspecified: Secondary | ICD-10-CM | POA: Diagnosis present

## 2022-08-16 DIAGNOSIS — Z888 Allergy status to other drugs, medicaments and biological substances status: Secondary | ICD-10-CM

## 2022-08-16 DIAGNOSIS — Z7902 Long term (current) use of antithrombotics/antiplatelets: Secondary | ICD-10-CM

## 2022-08-16 LAB — CBC
HCT: 36.5 % (ref 36.0–46.0)
HCT: 31.1 % — ABNORMAL LOW (ref 36.0–46.0)
HCT: 27.1 % — ABNORMAL LOW (ref 36.0–46.0)
Hemoglobin: 12.2 g/dL (ref 12.0–15.0)
Hemoglobin: 10.4 g/dL — ABNORMAL LOW (ref 12.0–15.0)
Hemoglobin: 9.1 g/dL — ABNORMAL LOW (ref 12.0–15.0)
MCH: 32.2 pg (ref 26.0–34.0)
MCH: 31.9 pg (ref 26.0–34.0)
MCH: 32.2 pg (ref 26.0–34.0)
MCHC: 33.4 g/dL (ref 30.0–36.0)
MCHC: 33.4 g/dL (ref 30.0–36.0)
MCHC: 33.6 g/dL (ref 30.0–36.0)
MCV: 96.3 fL (ref 80.0–100.0)
MCV: 95.4 fL (ref 80.0–100.0)
MCV: 95.8 fL (ref 80.0–100.0)
Platelets: 240 10*3/uL (ref 150–400)
Platelets: 204 10*3/uL (ref 150–400)
Platelets: 171 10*3/uL (ref 150–400)
RBC: 3.79 MIL/uL — ABNORMAL LOW (ref 3.87–5.11)
RBC: 3.26 MIL/uL — ABNORMAL LOW (ref 3.87–5.11)
RBC: 2.83 MIL/uL — ABNORMAL LOW (ref 3.87–5.11)
RDW: 11.8 % (ref 11.5–15.5)
RDW: 11.9 % (ref 11.5–15.5)
RDW: 11.9 % (ref 11.5–15.5)
WBC: 7.3 10*3/uL (ref 4.0–10.5)
WBC: 6.6 10*3/uL (ref 4.0–10.5)
WBC: 4.7 10*3/uL (ref 4.0–10.5)
nRBC: 0 % (ref 0.0–0.2)
nRBC: 0 % (ref 0.0–0.2)
nRBC: 0 % (ref 0.0–0.2)

## 2022-08-16 LAB — TYPE AND SCREEN
ABO/RH(D): O POS
Antibody Screen: NEGATIVE

## 2022-08-16 LAB — COMPREHENSIVE METABOLIC PANEL
ALT: 15 U/L (ref 0–44)
AST: 26 U/L (ref 15–41)
Albumin: 4 g/dL (ref 3.5–5.0)
Alkaline Phosphatase: 72 U/L (ref 38–126)
Anion gap: 4 — ABNORMAL LOW (ref 5–15)
BUN: 14 mg/dL (ref 8–23)
CO2: 25 mmol/L (ref 22–32)
Calcium: 8.5 mg/dL — ABNORMAL LOW (ref 8.9–10.3)
Chloride: 101 mmol/L (ref 98–111)
Creatinine, Ser: 0.62 mg/dL (ref 0.44–1.00)
GFR, Estimated: >60 mL/min (ref >60)
Glucose, Bld: 114 mg/dL — ABNORMAL HIGH (ref 70–99)
Potassium: 4.7 mmol/L (ref 3.5–5.1)
Sodium: 130 mmol/L — ABNORMAL LOW (ref 135–145)
Total Bilirubin: 0.4 mg/dL (ref 0.3–1.2)
Total Protein: 7.2 g/dL (ref 6.5–8.1)

## 2022-08-16 LAB — APTT: aPTT: 33 s (ref 24–36)

## 2022-08-16 LAB — PROTIME-INR
INR: 1.1 (ref 0.8–1.2)
Prothrombin Time: 14.2 seconds (ref 11.4–15.2)

## 2022-08-16 LAB — BRAIN NATRIURETIC PEPTIDE: B Natriuretic Peptide: 98.2 pg/mL (ref 0.0–100.0)

## 2022-08-16 MED ORDER — ACETAMINOPHEN 325 MG PO TABS: 650.0000 mg | ORAL_TABLET | Freq: Four times a day (QID) | ORAL | Status: DC | PRN

## 2022-08-16 MED ORDER — ADULT MULTIVITAMIN W/MINERALS CH
1.0000 | ORAL_TABLET | Freq: Every day | ORAL | Status: DC
  Administered 2022-08-18 – 2022-08-21 (×4): 1 via ORAL
  Filled 2022-08-16 (×5): qty 1

## 2022-08-16 MED ORDER — NICOTINE 21 MG/24HR TD PT24
21.0000 mg | MEDICATED_PATCH | Freq: Every day | TRANSDERMAL | Status: DC
  Filled 2022-08-16 (×4): qty 1

## 2022-08-16 MED ORDER — LORATADINE 10 MG PO TABS
10.0000 mg | ORAL_TABLET | Freq: Every day | ORAL | Status: DC
  Administered 2022-08-18 – 2022-08-21 (×4): 10 mg via ORAL
  Filled 2022-08-16 (×5): qty 1

## 2022-08-16 MED ORDER — SODIUM CHLORIDE 0.9 % IV SOLN: INTRAVENOUS | Status: DC

## 2022-08-16 MED ORDER — ONDANSETRON HCL 4 MG/2ML IJ SOLN: 4.0000 mg | Freq: Three times a day (TID) | INTRAMUSCULAR | Status: DC | PRN

## 2022-08-16 MED ORDER — PANTOPRAZOLE SODIUM 40 MG IV SOLR: 40.0000 mg | Freq: Two times a day (BID) | INTRAVENOUS | Status: DC

## 2022-08-16 MED ORDER — PANTOPRAZOLE SODIUM 40 MG IV SOLR
40.0000 mg | Freq: Two times a day (BID) | INTRAVENOUS | Status: DC
  Administered 2022-08-16 – 2022-08-21 (×10): 40 mg via INTRAVENOUS
  Filled 2022-08-16 (×10): qty 10

## 2022-08-16 MED ORDER — ALBUTEROL SULFATE HFA 108 (90 BASE) MCG/ACT IN AERS: 2.0000 | INHALATION_SPRAY | RESPIRATORY_TRACT | Status: DC | PRN

## 2022-08-16 MED ORDER — ALBUTEROL SULFATE (2.5 MG/3ML) 0.083% IN NEBU: 2.5000 mg | INHALATION_SOLUTION | RESPIRATORY_TRACT | Status: DC | PRN

## 2022-08-16 MED ORDER — FERROUS SULFATE 325 (65 FE) MG PO TABS
325.0000 mg | ORAL_TABLET | Freq: Every day | ORAL | Status: DC
  Administered 2022-08-17 – 2022-08-21 (×5): 325 mg via ORAL
  Filled 2022-08-16 (×5): qty 1

## 2022-08-16 MED ORDER — HYDRALAZINE HCL 20 MG/ML IJ SOLN: 5.0000 mg | INTRAMUSCULAR | Status: DC | PRN

## 2022-08-16 MED ORDER — LATANOPROST 0.005 % OP SOLN
1.0000 [drp] | Freq: Every day | OPHTHALMIC | Status: DC
  Administered 2022-08-20: 1 [drp] via OPHTHALMIC
  Filled 2022-08-16: qty 2.5

## 2022-08-16 MED ORDER — DM-GUAIFENESIN ER 30-600 MG PO TB12: 1.0000 | ORAL_TABLET | Freq: Two times a day (BID) | ORAL | Status: DC | PRN

## 2022-08-16 MED ORDER — ATORVASTATIN CALCIUM 10 MG PO TABS
10.0000 mg | ORAL_TABLET | Freq: Every day | ORAL | Status: DC
  Administered 2022-08-16 – 2022-08-21 (×6): 10 mg via ORAL
  Filled 2022-08-16 (×7): qty 1

## 2022-08-16 MED ORDER — PANTOPRAZOLE 80MG IVPB - SIMPLE MED
80.0000 mg | Freq: Once | INTRAVENOUS | Status: DC
  Filled 2022-08-16: qty 100

## 2022-08-16 MED ORDER — SODIUM CHLORIDE 0.9 % IV BOLUS
1000.0000 mL | Freq: Once | INTRAVENOUS | Status: AC
  Administered 2022-08-16: 1000 mL via INTRAVENOUS

## 2022-08-16 MED ORDER — PANTOPRAZOLE INFUSION (NEW) - SIMPLE MED
8.0000 mg/h | INTRAVENOUS | Status: DC
  Filled 2022-08-16 (×2): qty 100

## 2022-08-16 MED ORDER — VITAMIN B-12 1000 MCG PO TABS
1000.0000 ug | ORAL_TABLET | Freq: Every day | ORAL | Status: DC
  Administered 2022-08-18 – 2022-08-21 (×4): 1000 ug via ORAL
  Filled 2022-08-16 (×2): qty 1
  Filled 2022-08-16: qty 2
  Filled 2022-08-16 (×2): qty 1

## 2022-08-16 NOTE — H&P (Signed)
History and Physical    Allison Hampton RJJ:[id number] DOB: [birth year] DOA: 08/16/2022  Referring MD/NP/PA:   PCP: Derinda Late, MD   Patient coming from:  The patient is coming from home.  At baseline, pt is independent for most of ADL.        Chief Complaint: rectal bleeding  HPI: Allison Hampton is a 88 y.o. female with medical history significant of ischemic colitis, AV malformation of colon, hypertension, hyperlipidemia, COPD, stroke, GERD, anxiety, tobacco abuse, iron deficiency anemia, chronic hyponatremia, diastolic CHF, recent admission for stroke Plavix and aspirin, who presents with rectal bleeding.    Per her daughter at the bedside, patient has had 5 episodes of rectal bleeding, with bright red blood this afternoon.  Patient does not have nausea, vomiting.  She reports lower abdominal cramping.  No active abdominal pain.  Patient has lightheadedness and dizziness. She has generalized weakness.  No fall.  Denies chest pain, cough, shortness breath.  No symptoms of UTI.  Patient took 81 mg of aspirin and 75 mg of Plavix at this morning.   Initially patient had hypotension with blood pressure 82/53, BP improved to 100/50 after giving 1 L normal saline in ED.  Data reviewed independently and ED Course: pt was found to have hgb 12.9 on 08/12/22 --> 12.2 --> 10.4.  WBC 7.3, INR 1.1, GFR> 60, temperature normal, heart rate 53, RR 17, oxygen saturation 97% on room air.  Patient is admitted to PCU as inpatient. Consulted Dr. Marius Ditch and Vicente Males of GI.   EKG: I have personally reviewed.  Sinus rhythm, QTc 294, ventricular bigeminy, LAD.   Review of Systems:   General: no fevers, chills, no body weight gain, has fatigue HEENT: no blurry vision, hearing changes or sore throat Respiratory: no dyspnea, coughing, wheezing CV: no chest pain, no palpitations GI: no nausea, vomiting, diarrhea, constipation. Has rectal bleeding. GU: no dysuria, burning on urination, increased  urinary frequency, hematuria  Ext: no leg edema Neuro: no unilateral weakness, numbness, or tingling, no vision change or hearing loss Skin: no rash, no skin tear. MSK: No muscle spasm, no deformity, no limitation of range of movement in spin Heme: No easy bruising.  Travel history: No recent long distant travel.   Allergy:  Allergies  Allergen Reactions   Cetirizine Other (See Comments)    tachycardia   Ciprofloxacin    Codeine    Flagyl [Metronidazole]    Iodinated Contrast Media    Moxifloxacin Hcl In Nacl Other (See Comments)    hallucination   Penicillins     Has patient had a PCN reaction causing immediate rash, facial/tongue/throat swelling, SOB or lightheadedness with hypotension: yes Has patient had a PCN reaction causing severe rash involving mucus membranes or skin necrosis: yes Has patient had a PCN reaction that required hospitalization no Has patient had a PCN reaction occurring within the last 10 years: no If all of the above answers are "NO", then may proceed with Cephalosporin use.    Shellfish Allergy    Statins Other (See Comments)    myalgias, unspecified   Sulfa Antibiotics     Past Medical History:  Diagnosis Date   Abdominal mass    Actinic keratosis    Atrophic vaginitis    Colitis, ischemic (HCC)    GERD (gastroesophageal reflux disease)    Pelvic pain in female    Pyometra    Skin cancer ~2/21   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 George Ina and  pt referred to [facility] for txt   Squamous cell carcinoma of skin 10/20/2018   L hand dorsum   Squamous cell carcinoma of skin 07/21/2018   R pretibial below knww   Squamous cell carcinoma of skin 12/29/2014   SCC IS L dorsum foot   Squamous cell carcinoma of skin 08/17/2014   L lat calf   Squamous cell carcinoma of skin 04/01/2014   L upper arm   Squamous cell carcinoma of skin 05/18/2009   L dorsal hand   Squamous cell carcinoma of skin 10/02/2021   L mid pretibia, [facility]   Squamous  cell carcinoma of skin 06/11/2022   left foot dorsum, [facility]   Squamous cell carcinoma of skin 07/31/2022   L dorsum foot, recurrent, needs Mohs with Dr. Manley Mason   Uterine mass     Past Surgical History:  Procedure Laterality Date   adenoids     cataract surgery     CHOLECYSTECTOMY     TONSILLECTOMY      Social History:  reports that she has been smoking cigarettes. She has been smoking an average of .25 packs per day. She has never used smokeless tobacco. She reports that she does not drink alcohol and does not use drugs.  Family History:  Family History  Problem Relation Age of Onset   Heart disease Mother    Glaucoma Mother    Aneurysm Mother    Cancer Neg Hx    Diabetes Neg Hx    Ovarian cancer Neg Hx      Prior to Admission medications   Medication Sig Start Date End Date Taking? Authorizing Provider  aspirin 81 MG chewable tablet Chew by mouth daily.    [provider]  atenolol (TENORMIN) 25 MG tablet Take 1 tablet by mouth daily. 05/08/22   [provider]  atorvastatin (LIPITOR) 10 MG tablet Take 1 tablet (10 mg total) by mouth daily. 08/13/22 08/13/23  Vashti Hey, MD  clopidogrel (PLAVIX) 75 MG tablet Take 1 tablet (75 mg total) by mouth daily for 21 days. 08/13/22 09/03/22  Vashti Hey, MD  diazepam (VALIUM) 5 MG tablet Take 0.5 mg by mouth daily.  08/10/15   [provider]  docusate calcium (SURFAK) 240 MG capsule Take by mouth daily as needed.     [provider]  ferrous sulfate 325 (65 FE) MG tablet Take 325 mg by mouth daily with breakfast.    [provider]  fluticasone (FLONASE) 50 MCG/ACT nasal spray fluticasone propionate 50 mcg/actuation nasal spray,suspension Patient not taking: Reported on 08/11/2022 07/03/18   [provider]  hydrochlorothiazide (HYDRODIURIL) 12.5 MG tablet TAKE ONE TABLET EVERY DAY Patient not taking: Reported on 08/11/2022 01/16/19   [provider]   latanoprost (XALATAN) 0.005 % ophthalmic solution Place 1 drop into both eyes at bedtime.    [provider]  loratadine (CLARITIN) 10 MG tablet Take 10 mg by mouth daily.    [provider]  losartan (COZAAR) 50 MG tablet Take 50 mg by mouth daily.    [provider]  Multiple Vitamins-Minerals (CENTRUM SILVER PO) Take 1 tablet by mouth daily.     [provider]  Multiple Vitamins-Minerals (PRESERVISION AREDS PO) Take 1 tablet by mouth daily.    [provider]  mupirocin ointment (BACTROBAN) 2 % Apply 1-3 times per day to sores on legs. Patient not taking: Reported on 08/11/2022 12/01/20   Ralene Bathe, MD  mupirocin ointment (BACTROBAN) 2 % Apply  to wounds qd-bid Patient not taking: Reported on 08/11/2022 06/11/22   Brendolyn Patty, MD  omeprazole (PRILOSEC) 20 MG capsule Take 20 mg by mouth daily. 01/15/19   [provider]  Probiotic Product (GNP PROBIOTIC COLON SUPPORT) CAPS Take by mouth.    [provider]  vitamin B-12 (CYANOCOBALAMIN) 1000 MCG tablet Take 1,000 mcg by mouth daily.    [provider]    Physical Exam: Vitals:   08/16/22 1635 08/16/22 1645 08/16/22 1650 08/16/22 1700  BP:  (!) 103/58 (!) 137/56 104/83  Pulse: (!) 54 61 (!) 53 (!) 59  Resp: '12 15 20 11  '$ Temp:      TempSrc:      SpO2: 97% 96% 100% (!) 30%  Weight:       General: Not in acute distress HEENT:       Eyes: PERRL, EOMI, no scleral icterus.       ENT: No discharge from the ears and nose, no pharynx injection, no tonsillar enlargement.        Neck: No JVD, no bruit, no mass felt. Heme: No neck lymph node enlargement. Cardiac: S1/S2, RRR, No murmurs, No gallops or rubs. Respiratory: No rales, wheezing, rhonchi or rubs. GI: Soft, nondistended, nontender, no rebound pain, no organomegaly, BS present. GU: No hematuria Ext: No pitting leg edema bilaterally. 1+DP/PT pulse bilaterally. Musculoskeletal: No joint deformities, No joint  redness or warmth, no limitation of ROM in spin. Skin: No rashes.  Neuro: Alert, oriented X3, cranial nerves II-XII grossly intact, moves all extremities normally.  Psych: Patient is not psychotic, no suicidal or hemocidal ideation.  Labs on Admission: I have personally reviewed following labs and imaging studies  CBC: Recent Labs  Lab 08/11/22 1122 08/12/22 0421 08/16/22 1430 08/16/22 1638  WBC 5.5 3.9* 7.3 6.6  NEUTROABS  --  2.1  --   --   HGB 13.9 12.9 12.2 10.4*  HCT 42.1 38.2 36.5 31.1*  MCV 95.9 92.9 96.3 95.4  PLT 220 206 240 277   Basic Metabolic Panel: Recent Labs  Lab 08/11/22 1122 08/12/22 0421 08/16/22 1430  NA 129* 134* 130*  K 3.8 3.9 4.7  CL 97* 101 101  CO2 '24 26 25  '$ GLUCOSE 133* 90 114*  BUN '11 11 14  '$ CREATININE 0.49 0.47 0.62  CALCIUM 8.7* 8.5* 8.5*   GFR: Estimated Creatinine Clearance: 40.4 mL/min (by C-G formula based on SCr of 0.62 mg/dL). Liver Function Tests: Recent Labs  Lab 08/16/22 1430  AST 26  ALT 15  ALKPHOS 72  BILITOT 0.4  PROT 7.2  ALBUMIN 4.0   No results for input(s): "LIPASE", "AMYLASE" in the last 168 hours. No results for input(s): "AMMONIA" in the last 168 hours. Coagulation Profile: Recent Labs  Lab 08/16/22 1430  INR 1.1   Cardiac Enzymes: No results for input(s): "CKTOTAL", "CKMB", "CKMBINDEX", "TROPONINI" in the last 168 hours. BNP (last 3 results) No results for input(s): "PROBNP" in the last 8760 hours. HbA1C: No results for input(s): "HGBA1C" in the last 72 hours. CBG: No results for input(s): "GLUCAP" in the last 168 hours. Lipid Profile: No results for input(s): "CHOL", "HDL", "LDLCALC", "TRIG", "CHOLHDL", "LDLDIRECT" in the last 72 hours. Thyroid Function Tests: No results for input(s): "TSH", "T4TOTAL", "FREET4", "T3FREE", "THYROIDAB" in the last 72 hours. Anemia Panel: No results for input(s): "VITAMINB12", "FOLATE", "FERRITIN", "TIBC", "IRON", "RETICCTPCT" in the last 72 hours. Urine  analysis:    Component Value Date/Time   COLORURINE STRAW (A) 07/22/2016 1950  APPEARANCEUR CLEAR (A) 07/22/2016 1950   APPEARANCEUR Turbid 12/05/2013 0224   LABSPEC 1.002 (L) 07/22/2016 1950   LABSPEC 1.006 12/05/2013 0224   PHURINE 6.0 07/22/2016 1950   GLUCOSEU NEGATIVE 07/22/2016 1950   GLUCOSEU Negative 12/05/2013 0224   HGBUR NEGATIVE 07/22/2016 1950   BILIRUBINUR NEGATIVE 07/22/2016 1950   BILIRUBINUR Negative 12/05/2013 0224   KETONESUR TRACE (A) 07/22/2016 1950   PROTEINUR NEGATIVE 07/22/2016 1950   NITRITE NEGATIVE 07/22/2016 1950   LEUKOCYTESUR TRACE (A) 07/22/2016 1950   LEUKOCYTESUR 3+ 12/05/2013 0224   Sepsis Labs: '@LABRCNTIP'$ (procalcitonin:4,lacticidven:4) )No results found for this or any previous visit (from the past 240 hour(s)).   Radiological Exams on Admission: No results found.    Assessment/Plan Principal Problem:   GI bleeding Active Problems:   Acute blood loss anemia   Iron deficiency anemia   Stroke (HCC)   Benign essential HTN   Chronic hyponatremia   Chronic obstructive pulmonary disease (HCC)   HLD (hyperlipidemia)   Chronic diastolic CHF (congestive heart failure) (HCC)   GERD (gastroesophageal reflux disease)   Assessment and Plan:  GI bleeding, acute blood loss anemia and iron deficiency anemia: Hgb 12.9 --> 12.2 --> 10.4.  Initially hypotensive which responded to IV fluid resuscitation.  Currently hemodynamically stable. Consulted Dr. Vicente Males and Dr. Marius Ditch of GI.   - will admitted to progressive bed as inpatient - hold ASA and plavix - IVF: 1L NS bolus, then at 75 mL/hr - Start IV pantoprazole 40 mg bid - Zofran IV for nausea - Avoid NSAIDs and SQ heparin - Maintain IV access (2 large bore IVs if possible). - Monitor closely and follow q6h cbc, transfuse as necessary, if Hgb<7.0 - LaB: INR, PTT and type screen - will get tagged RBC NM scan. If positive, will need to call IR and surgeon.  Stroke ([place]) -hold ASA and  Plavix -Continue Lipitor  Benign essential HTN -Hold blood pressure medications: HCTZ, Cozaar, atenolol  Chronic hyponatremia: Sodium 130, mental status normal -IV fluid for normal saline as above  Chronic obstructive pulmonary disease (HCC) -As needed albuterol  HLD (hyperlipidemia) -Lipitor  Chronic diastolic CHF (congestive heart failure) ([place]): 2D echo on 08/12/2022 showed EF of 55 to 60% with grade 1 diastolic dysfunction.  Patient does not have leg edema.  CHF seem to be compensated.  BNP 98. -Watch volume status closely  GERD (gastroesophageal reflux disease) -On IV Protonix as above      DVT ppx: SCD  Code Status: DNR per pt and her daughter  Family Communication:   Yes, patient's  daughter  at bed side.     Disposition Plan:  Anticipate discharge back to previous environment  Consults called:  Consulted Dr. Marius Ditch and Vicente Males of GI  Admission status and Level of care: Progressive:   as inpt      Dispo: The patient is from: Home              Anticipated d/c is to: Home              Anticipated d/c date is: 2 days              Patient currently is not medically stable to d/c.    Severity of Illness:  The appropriate patient status for this patient is INPATIENT. Inpatient status is judged to be reasonable and necessary in order to provide the required intensity of service to ensure the patient's safety. The patient's presenting symptoms, physical exam findings, and initial radiographic and laboratory  data in the context of their chronic comorbidities is felt to place them at high risk for further clinical deterioration. Furthermore, it is not anticipated that the patient will be medically stable for discharge from the hospital within 2 midnights of admission.   * I certify that at the point of admission it is my clinical judgment that the patient will require inpatient hospital care spanning beyond 2 midnights from the point of admission due to high intensity of  service, high risk for further deterioration and high frequency of surveillance required.*       Date of Service 08/16/2022    Ivor Costa [facility]   If 7PM-7AM, please contact night-coverage [email or web address] 08/16/2022, 5:18 PM

## 2022-08-16 NOTE — ED Provider Notes (Signed)
[facility] Provider Note    Event Date/Time   First MD Initiated Contact with Patient 08/16/22 1521     (approximate)   History   Rectal Bleeding   HPI  Allison Hampton is a 88 y.o. female  who presents to the emergency department today because of concern for rectal bleeding. Patient started having rectal bleeding about an hour and a half ago. She has since had multiple episodes of bloody stool. The patient was discharged from the hospital three days ago. Patient had an admission for stroke and was discharged on plavix. She does have some accompanied lower abdominal cramping. The patient denies any fevers.        Physical Exam   Triage Vital Signs: ED Triage Vitals [08/16/22 1427]  Enc Vitals Group     BP 135/71     Pulse Rate (!) 53     Resp 17     Temp 98.3 F (36.8 C)     Temp Source Oral     SpO2 97 %     Weight 140 lb (63.5 kg)     Height      Head Circumference      Peak Flow      Pain Score      Pain Loc      Pain Edu?      Excl. in [place]?     Most recent vital signs: Vitals:   08/16/22 1427  BP: 135/71  Pulse: (!) 53  Resp: 17  Temp: 98.3 F (36.8 C)  SpO2: 97%   General: Awake, alert, oriented. CV:  Good peripheral perfusion. Regular rate and rhythm. Resp:  Normal effort. Lungs clear. Abd:  No distention. Minimal tenderness to lower abdominal palpation.    ED Results / Procedures / Treatments   Labs (all labs ordered are listed, but only abnormal results are displayed) Labs Reviewed  COMPREHENSIVE METABOLIC PANEL - Abnormal; Notable for the following components:      Result Value   Sodium 130 (*)    Glucose, Bld 114 (*)    Calcium 8.5 (*)    Anion gap 4 (*)    All other components within normal limits  CBC - Abnormal; Notable for the following components:   RBC 3.79 (*)    All other components within normal limits  PROTIME-INR  CBC  CBC  CBC  APTT  POC OCCULT BLOOD, ED  TYPE AND SCREEN      EKG  None   RADIOLOGY None   PROCEDURES:  Critical Care performed: No  Procedures   MEDICATIONS ORDERED IN ED: Medications - No data to display   IMPRESSION / MDM / [place] / ED COURSE  I reviewed the triage vital signs and the nursing notes.                              Differential diagnosis includes, but is not limited to, upper gi bleed, lower gi bleed.   Patient's presentation is most consistent with acute presentation with potential threat to life or bodily function.  The patient is on the cardiac monitor to evaluate for evidence of arrhythmia and/or significant heart rate changes.  Patient presented to the emergency department today because of concern for rectal bleeding. Has now had multiple episodes this evening, accompanied by lower abdominal cramping. Patient without hypotension or tachycardia upon arrival. Blood work without significant anemia. At this time think likely  lower gi bleed, however will trial protonix. Do not feel patient requires blood transfusion at this time. Did discuss with patient need for admission. Discussed with Dr. Blaine Hamper with the hospitalist service who will plan on admission.     FINAL CLINICAL IMPRESSION(S) / ED DIAGNOSES   Final diagnoses:  Rectal bleeding       Note:  This document was prepared using Dragon voice recognition software and may include unintentional dictation errors.    Nance Pear, MD 08/16/22 [phone]

## 2022-08-16 NOTE — ED Triage Notes (Signed)
Pt arrived via EMS. Pt sts that she was d/c this past Monday from a stroke. Pt comes in today due to rectal bleeding. Pt had a large bloody BM in triage. Pt is on plavix.

## 2022-08-17 ENCOUNTER — Encounter: Payer: Self-pay | Admitting: Internal Medicine

## 2022-08-17 ENCOUNTER — Inpatient Hospital Stay: Payer: Medicare Other

## 2022-08-17 ENCOUNTER — Other Ambulatory Visit: Payer: Self-pay

## 2022-08-17 DIAGNOSIS — K625 Hemorrhage of anus and rectum: Secondary | ICD-10-CM

## 2022-08-17 DIAGNOSIS — D509 Iron deficiency anemia, unspecified: Secondary | ICD-10-CM | POA: Diagnosis not present

## 2022-08-17 DIAGNOSIS — J449 Chronic obstructive pulmonary disease, unspecified: Secondary | ICD-10-CM

## 2022-08-17 DIAGNOSIS — K922 Gastrointestinal hemorrhage, unspecified: Secondary | ICD-10-CM | POA: Diagnosis not present

## 2022-08-17 DIAGNOSIS — E871 Hypo-osmolality and hyponatremia: Secondary | ICD-10-CM

## 2022-08-17 DIAGNOSIS — D62 Acute posthemorrhagic anemia: Secondary | ICD-10-CM | POA: Diagnosis not present

## 2022-08-17 DIAGNOSIS — I10 Essential (primary) hypertension: Secondary | ICD-10-CM | POA: Diagnosis not present

## 2022-08-17 DIAGNOSIS — E785 Hyperlipidemia, unspecified: Secondary | ICD-10-CM

## 2022-08-17 LAB — BASIC METABOLIC PANEL
Anion gap: 6 (ref 5–15)
BUN: 12 mg/dL (ref 8–23)
CO2: 25 mmol/L (ref 22–32)
Calcium: 8.1 mg/dL — ABNORMAL LOW (ref 8.9–10.3)
Chloride: 105 mmol/L (ref 98–111)
Creatinine, Ser: 0.45 mg/dL (ref 0.44–1.00)
GFR, Estimated: >60 mL/min (ref >60)
Glucose, Bld: 99 mg/dL (ref 70–99)
Potassium: 4.1 mmol/L (ref 3.5–5.1)
Sodium: 136 mmol/L (ref 135–145)

## 2022-08-17 LAB — CBC
HCT: 27.3 % — ABNORMAL LOW (ref 36.0–46.0)
HCT: 27.6 % — ABNORMAL LOW (ref 36.0–46.0)
HCT: 28.7 % — ABNORMAL LOW (ref 36.0–46.0)
Hemoglobin: 9 g/dL — ABNORMAL LOW (ref 12.0–15.0)
Hemoglobin: 9.1 g/dL — ABNORMAL LOW (ref 12.0–15.0)
Hemoglobin: 9.8 g/dL — ABNORMAL LOW (ref 12.0–15.0)
MCH: 31.7 pg (ref 26.0–34.0)
MCH: 31.8 pg (ref 26.0–34.0)
MCH: 32.5 pg (ref 26.0–34.0)
MCHC: 33 g/dL (ref 30.0–36.0)
MCHC: 33 g/dL (ref 30.0–36.0)
MCHC: 34.1 g/dL (ref 30.0–36.0)
MCV: 96.1 fL (ref 80.0–100.0)
MCV: 96.5 fL (ref 80.0–100.0)
MCV: 95 fL (ref 80.0–100.0)
Platelets: 162 10*3/uL (ref 150–400)
Platelets: 168 10*3/uL (ref 150–400)
Platelets: 173 10*3/uL (ref 150–400)
RBC: 2.84 MIL/uL — ABNORMAL LOW (ref 3.87–5.11)
RBC: 2.86 MIL/uL — ABNORMAL LOW (ref 3.87–5.11)
RBC: 3.02 MIL/uL — ABNORMAL LOW (ref 3.87–5.11)
RDW: 11.9 % (ref 11.5–15.5)
RDW: 11.9 % (ref 11.5–15.5)
RDW: 11.9 % (ref 11.5–15.5)
WBC: 4.6 10*3/uL (ref 4.0–10.5)
WBC: 4.4 10*3/uL (ref 4.0–10.5)
WBC: 4.9 10*3/uL (ref 4.0–10.5)
nRBC: 0 % (ref 0.0–0.2)
nRBC: 0 % (ref 0.0–0.2)
nRBC: 0 % (ref 0.0–0.2)

## 2022-08-17 MED ORDER — TECHNETIUM TC 99M-LABELED RED BLOOD CELLS IV KIT
22.1800 | PACK | Freq: Once | INTRAVENOUS | Status: AC | PRN
  Administered 2022-08-17: 22.18 via INTRAVENOUS

## 2022-08-17 NOTE — Progress Notes (Signed)
PROGRESS NOTE    Allison Hampton  WPY:[id number] DOB: 11/09/1927 DOA: 08/16/2022 PCP: Allison Late, MD    Brief Narrative:  Allison Hampton is a 88 years old female with past medical history of ischemic colitis, AV malformation of colon,hypertension, hyperlipidemia, COPD, stroke, GERD, anxiety, tobacco abuse, iron deficiency anemia, chronic hyponatremia, diastolic CHF, recent admission for stroke Plavix and aspirin, presented to hospital with multiple episodes of bright red rectal bleeding with lower abdominal cramps, lightheadedness dizziness and generalized weakness.  Patient had taken aspirin and Plavix the morning of presentation.  In the ED, initially, patient had hypotension with blood pressure 82/53, BP improved to 100/50 after giving 1 L normal saline in ED. patient was noted to have a hemoglobin of 10.2.  GI was consulted and patient was admitted hospital for further evaluation and treatment.  Assessment and plan.  GI bleeding, acute blood loss anemia and iron deficiency anemia: Hemoglobin has down trended from 12.9 to 9.0 at this time.  Was initially hypotensive and responded with IV fluids.  GI has been consulted.  Continue to hold aspirin and Plavix.  Continue IV Protonix antiemetics.  Will transfuse for hemoglobin less than 7 or ongoing bleeding.  Nuclear medicine GI blood loss scan has been ordered.  Follow GI recommendation.   Stroke [facility]) Recent stroke.  Patient was recently discharged home with home health on 08/13/2022, continue to hold aspirin and Plavix for now.  Continue Lipitor.  Benign essential HTN Patient is on HCTZ, Cozaar, atenolol at home.  Currently on hold due to hypotension.   Chronic hyponatremia:  Sodium today at 130.  Will continue to monitor.   Chronic obstructive pulmonary disease (HCC) Compensated.  Continue as needed albuterol.   HLD (hyperlipidemia) Continue Lipitor   Chronic diastolic CHF (congestive heart failure) ([place]):  2D echo  on 08/12/2022 showed EF of 55 to 60% with grade 1 diastolic dysfunction.  Compensated at this time.  Watch for fluid overload.   GERD (gastroesophageal reflux disease) Continue IV Protonix.  Generalized weakness, deconditioning.  Will get PT evaluation when GI issues sorted out.  Patient has currently been living with her daughter after her recent hospitalization for stroke.      DVT prophylaxis: SCDs Start: 08/16/22 1623   Code Status:     Code Status: DNR  Disposition: Home  Status is: Inpatient  Remains inpatient appropriate because: GI bleed, hypotension, GI consultation.   Family Communication: Spoke with the patient's daughter at bedside and updated her about the clinical condition of the patient..  Consultants:  GI  Procedures:  None yet  Antimicrobials:  None  Anti-infectives (From admission, onward)    None      Subjective: Today, patient was seen and examined at bedside.  Patient stated that she has not had a bowel movement since yesterday 11:30 PM.  Has mild right lower quadrant pain.  Denies any nausea, vomiting, feels hungry.  Denies any chest pain shortness of breath.  Objective: Vitals:   08/17/22 0600 08/17/22 0730 08/17/22 0800 08/17/22 0900  BP: 117/70 121/61 (!) 140/65 115/61  Pulse: 64 64 62 67  Resp: '10 11 11 13  '$ Temp:    98.5 F (36.9 C)  TempSrc:    Oral  SpO2: 97% 96% 97% 95%  Weight:        Intake/Output Summary (Last 24 hours) at 08/17/2022 1128 Last data filed at 08/16/2022 1959 Gross per 24 hour  Intake 1000 ml  Output --  Net 1000 ml  Filed Weights   08/16/22 1427  Weight: 63.5 kg    Physical Examination: Body mass index is 23.3 kg/m.   General:  Average built, not in obvious distress, elderly female, hard of hearing HENT:   Mild pallor noted.  Oral mucosa is moist.  Chest:  Diminished breath sounds bilaterally. No crackles or wheezes.  CVS: S1 &S2 heard. No murmur.  Regular rate and rhythm. Abdomen: Soft,   nondistended.  Bowel sounds are heard.  Right lower quadrant tenderness noted Extremities: No cyanosis, clubbing or edema.  Peripheral pulses are palpable. Psych: Alert, awake and Communicative, CNS:  No cranial nerve deficits.  Power equal in all extremities.   Skin: Warm and dry.  No rashes noted.  Data Reviewed:   CBC: Recent Labs  Lab 08/12/22 0421 08/16/22 1430 08/16/22 1638 08/16/22 2145 08/17/22 0410  WBC 3.9* 7.3 6.6 4.7 4.6  NEUTROABS 2.1  --   --   --   --   HGB 12.9 12.2 10.4* 9.1* 9.0*  HCT 38.2 36.5 31.1* 27.1* 27.3*  MCV 92.9 96.3 95.4 95.8 96.1  PLT 206 240 204 171 976    Basic Metabolic Panel: Recent Labs  Lab 08/11/22 1122 08/12/22 0421 08/16/22 1430 08/17/22 0410  NA 129* 134* 130* 136  K 3.8 3.9 4.7 4.1  CL 97* 101 101 105  CO2 '24 26 25 25  '$ GLUCOSE 133* 90 114* 99  BUN '11 11 14 12  '$ CREATININE 0.49 0.47 0.62 0.45  CALCIUM 8.7* 8.5* 8.5* 8.1*    Liver Function Tests: Recent Labs  Lab 08/16/22 1430  AST 26  ALT 15  ALKPHOS 72  BILITOT 0.4  PROT 7.2  ALBUMIN 4.0     Radiology Studies: No results found.    LOS: 1 day    Flora Lipps, MD [facility] Available via Epic secure chat 7am-7pm After these hours, please refer to coverage provider listed on [email or web address] 08/17/2022, 11:28 AM

## 2022-08-17 NOTE — Plan of Care (Signed)
  Problem: Clinical Measurements: Goal: Ability to maintain clinical measurements within normal limits will improve Outcome: Progressing   Problem: Safety: Goal: Ability to remain free from injury will improve Outcome: Progressing   Problem: Skin Integrity: Goal: Risk for impaired skin integrity will decrease Outcome: Progressing   

## 2022-08-17 NOTE — H&P (Signed)
Allison Hampton , MD [address], [place], [place], [place], [address] [address], [place], [place], [place], [address] Phone: [phone]  Fax: [phone]  Consultation  Referring Provider:     Dr Blaine Hamper Primary Care Physician:  Derinda Late, MD Primary Gastroenterologist:  [facility]          Reason for Consultation:     GI bleed  Date of Admission:  08/16/2022 Date of Consultation:  08/17/2022         HPI:   Allison Hampton is a 88 y.o. female with a prior history of GERD and ischemic colitis was to follow-up with [facility] clinic gastroenterology back in 2019 -2020 presented to the emergency room with rectal bleeding on 08/16/2022.  She also has a history of COPD, GERD, CHF on Plavix and aspirin.  When I went to see her she was in nuclear imaging.  She says that she has had bright red blood per rectum that is painless going on for over 2 days none since this morning.  No similar complaint in the past.  She recollects her last colonoscopy was some years back it was complicated by perforation and over 100 days hospitalization due to sepsis.  She absolutely does not want another colonoscopy at this point of time.   Baseline hemoglobin around 12.9 g on admission was 10.4 g and today's 9.0 g MCV 96.1.  No elevation in BUN/creatinine ratio.  No abdominal imaging on this admission.  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 George Ina and pt referred to [facility] for txt   Squamous cell carcinoma of skin 10/20/2018   L hand dorsum   Squamous cell carcinoma of skin 07/21/2018   R pretibial below knww   Squamous cell carcinoma of skin 12/29/2014   SCC IS L dorsum foot   Squamous cell carcinoma of skin 08/17/2014   L lat calf    Squamous cell carcinoma of skin 04/01/2014   L upper arm   Squamous cell carcinoma of skin 05/18/2009   L dorsal hand   Squamous cell carcinoma of skin 10/02/2021   L mid pretibia, [facility]   Squamous cell carcinoma of skin 06/11/2022   left foot dorsum, [facility]   Squamous cell carcinoma of skin 07/31/2022   L dorsum foot, recurrent, needs Mohs with Dr. Manley Mason   Uterine mass     Past Surgical History:  Procedure Laterality Date   adenoids     cataract surgery     CHOLECYSTECTOMY     TONSILLECTOMY      Prior to Admission medications   Medication Sig Start Date End Date Taking? Authorizing Provider  aspirin 81 MG chewable tablet Chew by mouth daily.   Yes [provider]  atenolol (TENORMIN) 25 MG tablet Take 1 tablet by mouth daily. 05/08/22  Yes [provider]  atorvastatin (LIPITOR) 10 MG tablet Take 1 tablet (10 mg total) by mouth daily. 08/13/22 08/13/23 Yes Vashti Hey, MD  clopidogrel (PLAVIX) 75 MG tablet Take 1 tablet (75 mg total) by mouth daily for 21 days. 08/13/22 09/03/22 Yes Vashti Hey, MD  diazepam (VALIUM) 5 MG tablet  Take 0.5 mg by mouth daily.  08/10/15  Yes [provider]  docusate calcium (SURFAK) 240 MG capsule Take by mouth daily as needed.    Yes [provider]  ferrous sulfate 325 (65 FE) MG tablet Take 325 mg by mouth daily with breakfast.   Yes [provider]  latanoprost (XALATAN) 0.005 % ophthalmic solution Place 1 drop into both eyes at bedtime.   Yes [provider]  loratadine (CLARITIN) 10 MG tablet Take 10 mg by mouth daily.   Yes [provider]  losartan (COZAAR) 50 MG tablet Take 50 mg by mouth daily.   Yes [provider]  Multiple Vitamins-Minerals (CENTRUM SILVER PO) Take 1 tablet by mouth daily.    Yes [provider]  Multiple Vitamins-Minerals (PRESERVISION AREDS PO) Take 1 tablet by mouth daily.   Yes [provider]  omeprazole  (PRILOSEC) 20 MG capsule Take 20 mg by mouth daily. 01/15/19  Yes [provider]  Probiotic Product (GNP PROBIOTIC COLON SUPPORT) CAPS Take by mouth.   Yes [provider]  vitamin B-12 (CYANOCOBALAMIN) 1000 MCG tablet Take 1,000 mcg by mouth daily.   Yes [provider]  fluticasone (FLONASE) 50 MCG/ACT nasal spray fluticasone propionate 50 mcg/actuation nasal spray,suspension Patient not taking: Reported on 08/11/2022 07/03/18   [provider]  hydrochlorothiazide (HYDRODIURIL) 12.5 MG tablet TAKE ONE TABLET EVERY DAY Patient not taking: Reported on 08/11/2022 01/16/19   [provider]  mupirocin ointment (BACTROBAN) 2 % Apply 1-3 times per day to sores on legs. Patient not taking: Reported on 08/11/2022 12/01/20   Ralene Bathe, MD  mupirocin ointment Drue Stager) 2 % Apply to wounds qd-bid Patient not taking: Reported on 08/11/2022 06/11/22   Brendolyn Patty, MD    Family History  Problem Relation Age of Onset   Heart disease Mother    Glaucoma Mother    Aneurysm Mother    Cancer Neg Hx    Diabetes Neg Hx    Ovarian cancer Neg Hx      Social History   Tobacco Use   Smoking status: Light Smoker    Packs/day: 0.25    Types: Cigarettes   Smokeless tobacco: Never   Tobacco comments:    8 cigarettes a day  Vaping Use   Vaping Use: Never used  Substance Use Topics   Alcohol use: No   Drug use: No    Allergies as of 08/16/2022 - Review Complete 08/16/2022  Allergen Reaction Noted   Cetirizine Other (See Comments) 08/17/2015   Ciprofloxacin  06/27/2015   Codeine  06/27/2015   Flagyl [metronidazole]  06/27/2015   Iodinated contrast media  06/27/2015   Moxifloxacin hcl in nacl Other (See Comments) 08/17/2015   Penicillins  06/27/2015   Shellfish allergy  06/27/2015   Statins Other (See Comments) 08/17/2015   Sulfa antibiotics  06/27/2015    Review of Systems:    All systems reviewed and negative except where noted in HPI.    Physical Exam:  Vital signs in last 24 hours: Temp:  [98.3 F (36.8 C)-98.8 F (37.1 C)] 98.5 F (36.9 C) (12/15 0900) Pulse Rate:  [51-67] 67 (12/15 0900) Resp:  [9-20] 13 (12/15 0900) BP: (82-140)/(48-83) 115/61 (12/15 0900) SpO2:  [30 %-100 %] 95 % (12/15 0900) Weight:  [63.5 kg] 63.5 kg (12/14 1427) Last BM Date : 08/17/22 General:   Pleasant, cooperative in NAD Head:  Normocephalic and atraumatic. Eyes:   No icterus.   Conjunctiva pink. PERRLA.  Ears:  Normal auditory acuity. Limited exam as the patient was in nuclear imaging undergoing RBC scan Abdomen:  Soft, nondistended, nontender. Normal bowel sounds. No appreciable masses or hepatomegaly.  No rebound or guarding.  Neurologic:  Alert and oriented x3;  grossly normal neurologically. Psych:  Alert and cooperative. Normal affect.  LAB RESULTS: Recent Labs    08/16/22 1638 08/16/22 2145 08/17/22 0410  WBC 6.6 4.7 4.6  HGB 10.4* 9.1* 9.0*  HCT 31.1* 27.1* 27.3*  PLT 204 171 162   BMET Recent Labs    08/16/22 1430 08/17/22 0410  NA 130* 136  K 4.7 4.1  CL 101 105  CO2 25 25  GLUCOSE 114* 99  BUN 14 12  CREATININE 0.62 0.45  CALCIUM 8.5* 8.1*   LFT Recent Labs    08/16/22 1430  PROT 7.2  ALBUMIN 4.0  AST 26  ALT 15  ALKPHOS 72  BILITOT 0.4   PT/INR Recent Labs    08/16/22 1430  LABPROT 14.2  INR 1.1    STUDIES: No results found.    Impression / Plan:   Allison Hampton is a 88 y.o. y/o female with a past medical history of stroke, GERD, COPD, hypertension on aspirin and Plavix presented to the hospital with rectal bleeding 3 g drop in hemoglobin from baseline.  I have been asked to consult for the rectal bleeding.  Last dose of aspirin Plavix taken yesterday.  She says that she has had a colonoscopy some years back complicated by perforation and 100-day stay at the hospital for sepsis and absolutely does not want a colonoscopy.  Plan 1.  Monitor CBC and transfuse as needed 2.  Hold  aspirin and Plavix if possible until bleeding resolves.  Restarting would be a balance of risk versus benefit ratio. 3.  If has acute brisk bleeding consider CT angio or tagged RBC scan.  No point in checking stool occult as rectal bleeding has been observed it would not change management and negative test will not change what we are doing. 4.  Since the patient absolutely does not want a colonoscopy would need to treat conservatively.  The patient changes her mind please do let us know    Thank you for involving me in the care of this patient.      LOS: 1 day   Allison Bellows, MD  08/17/2022, 9:28 AM

## 2022-08-17 NOTE — Hospital Course (Signed)
Allison Hampton is a 88 years old female with past medical history of ischemic colitis, AV malformation of colon,hypertension, hyperlipidemia, COPD, stroke, GERD, anxiety, tobacco abuse, iron deficiency anemia, chronic hyponatremia, diastolic CHF, recent admission for stroke Plavix and aspirin, presented to hospital with multiple episodes of bright red rectal bleeding with lower abdominal cramps, lightheadedness dizziness and generalized weakness.  Patient had taken aspirin and Plavix the morning of presentation.  In the ED, initially, patient had hypotension with blood pressure 82/53, BP improved to 100/50 after giving 1 L normal saline in ED. patient was noted to have a hemoglobin of 10.2.  GI was consulted and patient was admitted hospital for further evaluation and treatment.  Assessment and plan.  GI bleeding, acute blood loss anemia and iron deficiency anemia: Hemoglobin has down trended from 12.9-9.0 at this time.  Was initially hypotensive and responded with IV fluids.  GI has been consulted.  Continue to hold aspirin and Plavix.  Continue IV Protonix antiemetics.  Will transfuse for hemoglobin less than 7 or ongoing bleeding.  Nuclear medicine GI blood loss scan has been ordered.   Stroke [facility]) Hold aspirin and Plavix for now.  Continue Lipitor.  Benign essential HTN Patient is on HCTZ, Cozaar, atenolol at home.  Currently on hold due to hypotension.   Chronic hyponatremia:  Sodium today at 130.  Will continue to monitor.   Chronic obstructive pulmonary disease (HCC) Compensated.  Continue as needed albuterol.   HLD (hyperlipidemia) Continue Lipitor   Chronic diastolic CHF (congestive heart failure) ([place]):  2D echo on 08/12/2022 showed EF of 55 to 60% with grade 1 diastolic dysfunction.  Compensated at this time.  Watch for fluid overload.   GERD (gastroesophageal reflux disease) Continue IV Protonix.

## 2022-08-18 ENCOUNTER — Encounter: Payer: Self-pay | Admitting: Internal Medicine

## 2022-08-18 DIAGNOSIS — E871 Hypo-osmolality and hyponatremia: Secondary | ICD-10-CM | POA: Diagnosis not present

## 2022-08-18 DIAGNOSIS — K625 Hemorrhage of anus and rectum: Secondary | ICD-10-CM | POA: Diagnosis not present

## 2022-08-18 DIAGNOSIS — K922 Gastrointestinal hemorrhage, unspecified: Secondary | ICD-10-CM | POA: Diagnosis not present

## 2022-08-18 DIAGNOSIS — D62 Acute posthemorrhagic anemia: Secondary | ICD-10-CM | POA: Diagnosis not present

## 2022-08-18 LAB — CBC
HCT: 26.3 % — ABNORMAL LOW (ref 36.0–46.0)
HCT: 28.7 % — ABNORMAL LOW (ref 36.0–46.0)
Hemoglobin: 9.1 g/dL — ABNORMAL LOW (ref 12.0–15.0)
Hemoglobin: 9.7 g/dL — ABNORMAL LOW (ref 12.0–15.0)
MCH: 32 pg (ref 26.0–34.0)
MCH: 31.6 pg (ref 26.0–34.0)
MCHC: 34.6 g/dL (ref 30.0–36.0)
MCHC: 33.8 g/dL (ref 30.0–36.0)
MCV: 92.6 fL (ref 80.0–100.0)
MCV: 93.5 fL (ref 80.0–100.0)
Platelets: 165 10*3/uL (ref 150–400)
Platelets: 200 10*3/uL (ref 150–400)
RBC: 2.84 MIL/uL — ABNORMAL LOW (ref 3.87–5.11)
RBC: 3.07 MIL/uL — ABNORMAL LOW (ref 3.87–5.11)
RDW: 11.8 % (ref 11.5–15.5)
RDW: 12 % (ref 11.5–15.5)
WBC: 3.9 10*3/uL — ABNORMAL LOW (ref 4.0–10.5)
WBC: 5.8 10*3/uL (ref 4.0–10.5)
nRBC: 0 % (ref 0.0–0.2)
nRBC: 0 % (ref 0.0–0.2)

## 2022-08-18 LAB — URINALYSIS, ROUTINE W REFLEX MICROSCOPIC
Bilirubin Urine: NEGATIVE
Glucose, UA: NEGATIVE mg/dL
Hgb urine dipstick: NEGATIVE
Ketones, ur: NEGATIVE mg/dL
Leukocytes,Ua: NEGATIVE
Nitrite: NEGATIVE
Protein, ur: NEGATIVE mg/dL
Specific Gravity, Urine: 1.009 (ref 1.005–1.030)
pH: 5 (ref 5.0–8.0)

## 2022-08-18 LAB — BASIC METABOLIC PANEL
Anion gap: 4 — ABNORMAL LOW (ref 5–15)
BUN: 11 mg/dL (ref 8–23)
CO2: 26 mmol/L (ref 22–32)
Calcium: 8 mg/dL — ABNORMAL LOW (ref 8.9–10.3)
Chloride: 103 mmol/L (ref 98–111)
Creatinine, Ser: 0.45 mg/dL (ref 0.44–1.00)
GFR, Estimated: >60 mL/min (ref >60)
Glucose, Bld: 93 mg/dL (ref 70–99)
Potassium: 3.6 mmol/L (ref 3.5–5.1)
Sodium: 133 mmol/L — ABNORMAL LOW (ref 135–145)

## 2022-08-18 LAB — GLUCOSE, CAPILLARY
Glucose-Capillary: 96 mg/dL (ref 70–99)
Glucose-Capillary: 88 mg/dL (ref 70–99)

## 2022-08-18 LAB — MAGNESIUM: Magnesium: 1.8 mg/dL (ref 1.7–2.4)

## 2022-08-18 NOTE — TOC Initial Note (Signed)
Transition of Care [facility]) - Initial/Assessment Note    Patient Details  Name: Allison Hampton MRN: [id number] Date of Birth: 02/02/1928  Transition of [facility]) CM/SW Contact:    Magnus Ivan, LCSW Phone Number: 08/18/2022, 10:43 AM  Clinical Narrative:                 Patient known to [facility] for recent hospitalization. Patient resides at [facility]. Was set up with [facility] and a 3in1 earlier this week. TOC will follow for DC needs.   Expected Discharge Plan: Assisted Living Barriers to Discharge: Continued Medical Work up   Patient Goals and CMS Choice        Expected Discharge Plan and Services Expected Discharge Plan: Assisted Living                                              Prior Living Arrangements/Services                       Activities of Daily Living Home Assistive Devices/Equipment: Hearing aid, Cane (specify quad or straight), Walker (specify type) ADL Screening (condition at time of admission) Patient's cognitive ability adequate to safely complete daily activities?: Yes Is the patient deaf or have difficulty hearing?: Yes (bilat hearing aids at home) Does the patient have difficulty seeing, even when wearing glasses/contacts?: Yes (hx of macular degeneration) Does the patient have difficulty concentrating, remembering, or making decisions?: No Patient able to express need for assistance with ADLs?: Yes Does the patient have difficulty dressing or bathing?: No Independently performs ADLs?: Yes (appropriate for developmental age) Does the patient have difficulty walking or climbing stairs?: No Weakness of Legs: Right (recent stroke) Weakness of Arms/Hands: Right (recent stroke)  Permission Sought/Granted                  Emotional Assessment              Admission diagnosis:  GI bleeding [K92.2] Rectal bleeding [K62.5] Patient Active Problem List   Diagnosis Date Noted   Rectal bleeding 08/17/2022   GI  bleeding 08/16/2022   HLD (hyperlipidemia) 08/16/2022   Anxiety 08/16/2022   Iron deficiency anemia 08/16/2022   Chronic diastolic CHF (congestive heart failure) ([place]) 08/16/2022   Acute blood loss anemia 08/16/2022   Acute CVA (cerebrovascular accident) ([place]) 08/12/2022   Stroke ([place]) 08/11/2022   Chronic hyponatremia 08/11/2022   Abnormal echocardiogram findings without diagnosis 08/11/2022   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[place]) 08/11/2022   Tobacco use disorder 08/31/2016   Ischemic colitis ([place]) 08/31/2016   Melena 07/17/2016   Acute GI bleeding    GERD (gastroesophageal reflux disease) 08/17/2015   History of vaginal discharge 08/17/2015   Menopause 08/17/2015   Urethral caruncle 08/17/2015   Vaginal atrophy 08/17/2015   Chronic anxiety 08/17/2015   Chronic obstructive pulmonary disease ([place]) 08/17/2015   Benign essential HTN 08/06/2014   Absolute anemia 02/09/2014   PCP:  Derinda Late, MD Pharmacy:   [place], [place] [address] Phone: [phone] Fax: [phone]     Social Determinants of Health (SDOH) Interventions    Readmission Risk Interventions     No data to display

## 2022-08-18 NOTE — Progress Notes (Signed)
PROGRESS NOTE    Allison Hampton Card  JME:[id number] DOB: [birth year] DOA: 08/16/2022 PCP: Derinda Late, MD    Brief Narrative:  Allison Hampton is a 88 years old female with past medical history of ischemic colitis, AV malformation of colon,hypertension, hyperlipidemia, COPD, stroke, GERD, anxiety, tobacco abuse, iron deficiency anemia, chronic hyponatremia, diastolic CHF, recent admission for stroke Plavix and aspirin, presented to hospital with multiple episodes of bright red rectal bleeding with lower abdominal cramps, lightheadedness dizziness and generalized weakness.  Patient had taken aspirin and Plavix the morning of presentation.  In the ED, initially, patient had hypotension with blood pressure 82/53, BP improved to 100/50 after giving 1 L normal saline in ED. patient was noted to have a hemoglobin of 10.2.  GI was consulted and patient was admitted hospital for further evaluation and treatment.  Assessment and plan.  GI bleeding, acute blood loss anemia and iron deficiency anemia: Hemoglobin has down trended from 12.9 to 9.1 at this time.  Was initially hypotensive and responded with IV fluids.  GI has been consulted.  Continue to hold aspirin and Plavix.  Continue IV Protonix, antiemetics.  Will transfuse for hemoglobin less than 7 or ongoing bleeding.  Nuclear medicine GI blood loss scan was done without any active findings of bleeding.  Spoke with the patient and patient's daughter at bedside.  Will continue to monitor for 1 more day of aspirin and Plavix.  Discussed the risk versus benefits of being on aspirin and Plavix.  Patient used to be on aspirin in the past.     Stroke [facility]) Recent stroke.  Patient was recently discharged home with home health on 08/13/2022 on aspirin and Plavix and Lipitor.  Currently on hold.  Discussed about the risk versus benefits of dual antiplatelets.  Patient used to be on aspirin in the past.  Possibility single agent with Plavix on discharge  could be an alternative.  Benign essential HTN Patient is on HCTZ, Cozaar, atenolol at home.  Currently on hold due to hypotension.   Chronic hyponatremia:  Sodium today at 133.  Will continue to monitor.   Chronic obstructive pulmonary disease (HCC) Compensated.  Continue as needed albuterol.   Hyperlipidemia) Continue Lipitor   Chronic diastolic CHF (congestive heart failure) ([place]):  2D echo on 08/12/2022 showed EF of 55 to 60% with grade 1 diastolic dysfunction.  Compensated at this time.     GERD (gastroesophageal reflux disease) Continue IV Protonix.  Generalized weakness, deconditioning.   Will get PT evaluation.  Patient has currently been living with her daughter after her recent hospitalization for stroke.  Burning micturition.  Will get  urinalysis       DVT prophylaxis: SCDs Start: 08/16/22 1623   Code Status:     Code Status: DNR  Disposition: Home  Status is: Inpatient  Remains inpatient appropriate because: GI bleed, monitoring.   Family Communication: I again spoke with the patient's daughter at bedside and updated her about the clinical condition of the patient..  Consultants:  GI  Procedures:  None yet  Antimicrobials:  None  Anti-infectives (From admission, onward)    None      Subjective: Today, patient was seen and examined at bedside.  Patient denies any nausea vomiting, abdominal pain.  Had 1 maroon stool overnight.  Complains of mild burning sensation over the perineal region.    Objective: Vitals:   08/18/22 0500 08/18/22 0742 08/18/22 1158 08/18/22 1200  BP:  125/69 (!) 106/50 102/65  Pulse:  75  74 74  Resp:  '19 19 18  '$ Temp:  97.9 F (36.6 C) 97.8 F (36.6 C) 98 F (36.7 C)  TempSrc:      SpO2:  96% (!) 87%   Weight: 65.8 kg     Height:        Intake/Output Summary (Last 24 hours) at 08/18/2022 1305 Last data filed at 08/18/2022 1200 Gross per 24 hour  Intake 2978.05 ml  Output 354 ml  Net 2624.05 ml    Filed  Weights   08/16/22 1427 08/17/22 1604 08/18/22 0500  Weight: 63.5 kg 66.1 kg 65.8 kg    Physical Examination: Body mass index is 24.13 kg/m.   General:  Average built, not in obvious distress, elderly female, hard of hearing, HENT:    Oral mucosa is moist.  Mild pallor noted. Chest:  Diminished breath sounds bilaterally. No crackles or wheezes.  CVS: S1 &S2 heard. No murmur.  Regular rate and rhythm. Abdomen: Soft,  nondistended.  Bowel sounds are heard.   Extremities: No cyanosis, clubbing or edema.  Peripheral pulses are palpable. Psych: Alert, awake and Communicative, CNS:  No cranial nerve deficits.  Power equal in all extremities.   Skin: Warm and dry.  No rashes noted.  Data Reviewed:   CBC: Recent Labs  Lab 08/12/22 0421 08/16/22 1430 08/16/22 2145 08/17/22 0410 08/17/22 1137 08/17/22 1657 08/18/22 0607  WBC 3.9*   < > 4.7 4.6 4.4 4.9 3.9*  NEUTROABS 2.1  --   --   --   --   --   --   HGB 12.9   < > 9.1* 9.0* 9.1* 9.8* 9.1*  HCT 38.2   < > 27.1* 27.3* 27.6* 28.7* 26.3*  MCV 92.9   < > 95.8 96.1 96.5 95.0 92.6  PLT 206   < > 171 162 168 173 165   < > = values in this interval not displayed.     Basic Metabolic Panel: Recent Labs  Lab 08/12/22 0421 08/16/22 1430 08/17/22 0410 08/18/22 0607  NA 134* 130* 136 133*  K 3.9 4.7 4.1 3.6  CL 101 101 105 103  CO2 '26 25 25 26  '$ GLUCOSE 90 114* 99 93  BUN '11 14 12 11  '$ CREATININE 0.47 0.62 0.45 0.45  CALCIUM 8.5* 8.5* 8.1* 8.0*  MG  --   --   --  1.8     Liver Function Tests: Recent Labs  Lab 08/16/22 1430  AST 26  ALT 15  ALKPHOS 72  BILITOT 0.4  PROT 7.2  ALBUMIN 4.0      Radiology Studies: NM GI Blood Loss  Result Date: 08/17/2022 CLINICAL DATA:  Gastrointestinal bleeding. EXAM: NUCLEAR MEDICINE GASTROINTESTINAL BLEEDING SCAN TECHNIQUE: Sequential abdominal images were obtained following intravenous administration of Tc-41mlabeled red blood cells. RADIOPHARMACEUTICALS:  20.2 mCi Tc-927mpertechnetate in-vitro labeled red cells. COMPARISON:  None Available. FINDINGS: 2 hour dynamic planar imaging of the abdomen pelvis demonstrates no accumulation of tagged red blood cells within the gastrointestinal tract. Physiologic activity is noted within the blood pool and solid organs. IMPRESSION: No evidence of active gastrointestinal bleeding. Electronically Signed   By: StSuzy Bouchard.D.   On: 08/17/2022 14:35      LOS: 2 days    LaFlora LippsMD [facility] Available via Epic secure chat 7am-7pm After these hours, please refer to coverage provider listed on [email or web address] 08/18/2022, 1:05 PM

## 2022-08-18 NOTE — Progress Notes (Signed)
   08/18/22 1448  Vitals  BP (!) 114/43  MAP (mmHg) (!) 64  BP Location Right Arm  BP Method Automatic  Patient Position (if appropriate) Lying  Pulse Rate 75  Pulse Rate Source Monitor  ECG Heart Rate 82  Level of Consciousness  Level of Consciousness Alert  MEWS COLOR  MEWS Score Color Green  Oxygen Therapy  SpO2 94 %  O2 Device Room Air  MEWS Score  MEWS Temp 0  MEWS Systolic 0  MEWS Pulse 0  MEWS RR 0  MEWS LOC 0  MEWS Score 0   Called by CCMD that patient monitor showed 2 separate incidents of SVT.patient c/o of 'heart fluttering" Vitals done.no other symptoms reported

## 2022-08-19 DIAGNOSIS — D62 Acute posthemorrhagic anemia: Secondary | ICD-10-CM | POA: Diagnosis not present

## 2022-08-19 DIAGNOSIS — K625 Hemorrhage of anus and rectum: Secondary | ICD-10-CM | POA: Diagnosis not present

## 2022-08-19 DIAGNOSIS — E871 Hypo-osmolality and hyponatremia: Secondary | ICD-10-CM | POA: Diagnosis not present

## 2022-08-19 DIAGNOSIS — K922 Gastrointestinal hemorrhage, unspecified: Secondary | ICD-10-CM | POA: Diagnosis not present

## 2022-08-19 LAB — BASIC METABOLIC PANEL
Anion gap: 3 — ABNORMAL LOW (ref 5–15)
BUN: 10 mg/dL (ref 8–23)
CO2: 25 mmol/L (ref 22–32)
Calcium: 8.2 mg/dL — ABNORMAL LOW (ref 8.9–10.3)
Chloride: 106 mmol/L (ref 98–111)
Creatinine, Ser: 0.43 mg/dL — ABNORMAL LOW (ref 0.44–1.00)
GFR, Estimated: >60 mL/min (ref >60)
Glucose, Bld: 104 mg/dL — ABNORMAL HIGH (ref 70–99)
Potassium: 3.5 mmol/L (ref 3.5–5.1)
Sodium: 134 mmol/L — ABNORMAL LOW (ref 135–145)

## 2022-08-19 LAB — HEMOGLOBIN AND HEMATOCRIT, BLOOD
HCT: 26.8 % — ABNORMAL LOW (ref 36.0–46.0)
Hemoglobin: 9.2 g/dL — ABNORMAL LOW (ref 12.0–15.0)

## 2022-08-19 LAB — CBC
HCT: 25 % — ABNORMAL LOW (ref 36.0–46.0)
Hemoglobin: 8.5 g/dL — ABNORMAL LOW (ref 12.0–15.0)
MCH: 31.5 pg (ref 26.0–34.0)
MCHC: 34 g/dL (ref 30.0–36.0)
MCV: 92.6 fL (ref 80.0–100.0)
Platelets: 168 10*3/uL (ref 150–400)
RBC: 2.7 MIL/uL — ABNORMAL LOW (ref 3.87–5.11)
RDW: 12 % (ref 11.5–15.5)
WBC: 4.1 10*3/uL (ref 4.0–10.5)
nRBC: 0 % (ref 0.0–0.2)

## 2022-08-19 LAB — GLUCOSE, CAPILLARY: Glucose-Capillary: 112 mg/dL — ABNORMAL HIGH (ref 70–99)

## 2022-08-19 MED ORDER — BISACODYL 5 MG PO TBEC
10.0000 mg | DELAYED_RELEASE_TABLET | Freq: Once | ORAL | Status: AC
  Administered 2022-08-19: 10 mg via ORAL
  Filled 2022-08-19: qty 2

## 2022-08-19 MED ORDER — PEG 3350-KCL-NA BICARB-NACL 420 G PO SOLR: 2000.0000 mL | Freq: Once | ORAL | Status: DC | PRN

## 2022-08-19 MED ORDER — PEG 3350-KCL-NA BICARB-NACL 420 G PO SOLR
4000.0000 mL | Freq: Once | ORAL | Status: AC
  Administered 2022-08-19: 4000 mL via ORAL
  Filled 2022-08-19 (×2): qty 4000

## 2022-08-19 NOTE — Progress Notes (Signed)
Inpatient Follow-up/Progress Note   Patient ID: Allison Hampton is a 88 y.o. female.  Overnight Events / Subjective Findings Pt seen in the room. Resting comfortably having clear liquids. Discussed procedures tomorrow. Plan to start prep today. See phone call note with patient's daughter from today. Has some rlq discomfort after trying to consume more solid food yesterday. No n/v. No fever/chills. No other acute gi complaints.  Hgb today is 8.5 and continues to downtrend. BUN/Cr 10/0.43  Review of Systems  Constitutional:  Negative for activity change, appetite change, chills, diaphoresis, fatigue, fever and unexpected weight change.  HENT:  Negative for trouble swallowing and voice change.   Respiratory:  Negative for shortness of breath and wheezing.   Cardiovascular:  Negative for chest pain, palpitations and leg swelling.  Gastrointestinal:  Positive for abdominal pain (RLQ) and blood in stool (melena). Negative for abdominal distention, anal bleeding, constipation, diarrhea, nausea, rectal pain and vomiting.  Musculoskeletal:  Negative for arthralgias and myalgias.  Skin:  Negative for color change and pallor.  Neurological:  Negative for dizziness, syncope and weakness.  Psychiatric/Behavioral:  Negative for confusion.   All other systems reviewed and are negative.    Medications  Current Facility-Administered Medications:    0.9 %  sodium chloride infusion, , Intravenous, Continuous, Ivor Costa, MD, Last Rate: 75 mL/hr at 08/19/22 0922, New Bag at 08/19/22 2725   acetaminophen (TYLENOL) tablet 650 mg, 650 mg, Oral, Q6H PRN, Ivor Costa, MD   albuterol (PROVENTIL) (2.5 MG/3ML) 0.083% nebulizer solution 2.5 mg, 2.5 mg, Nebulization, Q4H PRN, Alison Murray, RPH   atorvastatin (LIPITOR) tablet 10 mg, 10 mg, Oral, Daily, Ivor Costa, MD, 10 mg at 08/19/22 3664   cyanocobalamin (VITAMIN B12) tablet 1,000 mcg, 1,000 mcg, Oral, Daily, Ivor Costa, MD, 1,000 mcg at 08/19/22 4034    dextromethorphan-guaiFENesin ([place] DM) 30-600 MG per 12 hr tablet 1 tablet, 1 tablet, Oral, BID PRN, Ivor Costa, MD   ferrous sulfate tablet 325 mg, 325 mg, Oral, Q breakfast, Ivor Costa, MD, 325 mg at 08/19/22 7425   hydrALAZINE (APRESOLINE) injection 5 mg, 5 mg, Intravenous, Q2H PRN, Ivor Costa, MD   latanoprost (XALATAN) 0.005 % ophthalmic solution 1 drop, 1 drop, Both Eyes, QHS, Niu, Xilin, MD   loratadine (CLARITIN) tablet 10 mg, 10 mg, Oral, Daily, Ivor Costa, MD, 10 mg at 08/19/22 9563   multivitamin with minerals tablet 1 tablet, 1 tablet, Oral, Daily, Ivor Costa, MD, 1 tablet at 08/19/22 8756   nicotine (NICODERM CQ - dosed in mg/24 hours) patch 21 mg, 21 mg, Transdermal, Daily, Ivor Costa, MD   ondansetron [facility]) injection 4 mg, 4 mg, Intravenous, Q8H PRN, Ivor Costa, MD   pantoprazole (PROTONIX) injection 40 mg, 40 mg, Intravenous, Q12H, Ivor Costa, MD, 40 mg at 08/19/22 4332   polyethylene glycol-electrolytes (NuLYTELY) solution 2,000 mL, 2,000 mL, Oral, Once PRN, Annamaria Helling, DO   polyethylene glycol-electrolytes (NuLYTELY) solution 4,000 mL, 4,000 mL, Oral, Once, Annamaria Helling, DO  sodium chloride 75 mL/hr at 08/19/22 9518    acetaminophen, albuterol, dextromethorphan-guaiFENesin, hydrALAZINE, ondansetron (ZOFRAN) IV, polyethylene glycol-electrolytes   Objective    Vitals:   08/19/22 0424 08/19/22 0759 08/19/22 1104 08/19/22 1119  BP: 135/64 127/72 (!) 121/56 (!) 130/58  Pulse: 76 90 78 75  Resp: '18 16 20 19  '$ Temp: 98.7 F (37.1 C) 98.4 F (36.9 C) 98.3 F (36.8 C) 98 F (36.7 C)  TempSrc: Oral Oral Oral   SpO2: 98% 95% 95% 96%  Weight: 62.2 kg     Height:         Physical Exam Vitals and nursing note reviewed.  Constitutional:      General: She is not in acute distress.    Appearance: Normal appearance. She is not ill-appearing, toxic-appearing or diaphoretic.  HENT:     Head: Normocephalic and atraumatic.     Nose: Nose normal.      Mouth/Throat:     Mouth: Mucous membranes are moist.     Pharynx: Oropharynx is clear.  Eyes:     General: No scleral icterus.    Extraocular Movements: Extraocular movements intact.  Cardiovascular:     Rate and Rhythm: Normal rate and regular rhythm.     Heart sounds: Normal heart sounds. No murmur heard.    No friction rub. No gallop.  Pulmonary:     Effort: Pulmonary effort is normal. No respiratory distress.     Breath sounds: Normal breath sounds. No wheezing, rhonchi or rales.  Abdominal:     General: Bowel sounds are normal. There is no distension.     Palpations: Abdomen is soft.     Tenderness: There is no abdominal tenderness. There is no guarding or rebound.  Musculoskeletal:     Cervical back: Neck supple.     Right lower leg: No edema.     Left lower leg: No edema.  Skin:    General: Skin is warm and dry.     Coloration: Skin is not jaundiced or pale.  Neurological:     General: No focal deficit present.     Mental Status: She is alert and oriented to person, place, and time. Mental status is at baseline.  Psychiatric:        Mood and Affect: Mood normal.        Behavior: Behavior normal.        Thought Content: Thought content normal.        Judgment: Judgment normal.      Laboratory Data Recent Labs  Lab 08/18/22 0607 08/18/22 1325 08/19/22 0525  WBC 3.9* 5.8 4.1  HGB 9.1* 9.7* 8.5*  HCT 26.3* 28.7* 25.0*  PLT 165 200 168   Recent Labs  Lab 08/16/22 1430 08/17/22 0410 08/18/22 0607 08/19/22 0525  NA 130* 136 133* 134*  K 4.7 4.1 3.6 3.5  CL 101 105 103 106  CO2 '25 25 26 25  '$ BUN '14 12 11 10  '$ CREATININE 0.62 0.45 0.45 0.43*  CALCIUM 8.5* 8.1* 8.0* 8.2*  PROT 7.2  --   --   --   BILITOT 0.4  --   --   --   ALKPHOS 72  --   --   --   ALT 15  --   --   --   AST 26  --   --   --   GLUCOSE 114* 99 93 104*   Recent Labs  Lab 08/16/22 1430  INR 1.1      Imaging Studies: NM GI Blood Loss  Result Date: 08/17/2022 CLINICAL DATA:   Gastrointestinal bleeding. EXAM: NUCLEAR MEDICINE GASTROINTESTINAL BLEEDING SCAN TECHNIQUE: Sequential abdominal images were obtained following intravenous administration of Tc-64mlabeled red blood cells. RADIOPHARMACEUTICALS:  20.2 mCi Tc-983mertechnetate in-vitro labeled red cells. COMPARISON:  None Available. FINDINGS: 2 hour dynamic planar imaging of the abdomen pelvis demonstrates no accumulation of tagged red blood cells within the gastrointestinal tract. Physiologic activity is noted within the blood pool and solid organs. IMPRESSION: No evidence of  active gastrointestinal bleeding. Electronically Signed   By: Suzy Bouchard M.D.   On: 08/17/2022 14:35    Assessment:   # GIB, ABLA, BRBPR, melena - suspect lower gi source given normal bun/cr ratio - hgb continues to trend down - remains hemodynamically stable - dapt remains held - tagged rbc scan negative 08/17/22  # h/o bowel perforation- remote  # recent stroke- on dapt  # gerd  # chronic hyponatremia  # COPD  # hypotension- on admission- resolved with resuscitation  # HFpEF g1 diastolic dysfunction; EF 91-50%   Plan:  Change to clear liquids. NPO at midnight Continue iv protonix 40 mg q12h Will start GoLytely prep this afternoon. OK to drink after npo order at midnight. If stool not see through by the time of finishing the first gallon, please provide second ordered prn.   Esophagogastroduodenoscopy and Colonoscopy planned for tomorrow pending patient stability and endoscopy suite availability Labs in am- bmp, cbc Hold dvt ppx. Plavix and asa also on hold Monitor H&H.  Transfusion and resuscitation as per primary team Avoid frequent lab draws to prevent lab induced anemia Supportive care and antiemetics as per primary team Maintain two sites IV access Avoid nsaids Monitor for GIB.   Esophagogastroduodenoscopy and Colonoscopy with possible biopsy, control of bleeding, polypectomy, and interventions as necessary  has been discussed with the patient/patient representative. Informed consent was obtained from the patient/patient representative after explaining the indication, nature, and risks of the procedure including but not limited to death, bleeding, perforation, missed neoplasm/lesions, cardiorespiratory compromise, and reaction to medications. Opportunity for questions was given and appropriate answers were provided. Patient/patient representative has verbalized understanding is amenable to undergoing the procedure.   Dr. Allen Norris will be resuming GI care on this patient tomorrow  I personally performed the service.  Management of other medical comorbidities as per primary team  Thank you for allowing [place] to participate in this patient's care. Please don't hesitate to call if any questions or concerns arise.   Annamaria Helling, DO [facility]  Portions of the record may have been created with voice recognition software. Occasional wrong-word or 'sound-a-like' substitutions may have occurred due to the inherent limitations of voice recognition software.  Read the chart carefully and recognize, using context, where substitutions may have occurred.

## 2022-08-19 NOTE — Progress Notes (Signed)
PROGRESS NOTE    Allison Hampton  YKZ:[id number] DOB: [birth year] DOA: 08/16/2022 PCP: Derinda Late, MD    Brief Narrative:  Allison Hampton is a 88 years old female with past medical history of ischemic colitis, AV malformation of colon,hypertension, hyperlipidemia, COPD, stroke, GERD, anxiety, tobacco abuse, iron deficiency anemia, chronic hyponatremia, diastolic CHF, recent admission for stroke Plavix and aspirin, presented to hospital with multiple episodes of bright red rectal bleeding with lower abdominal cramps, lightheadedness dizziness and generalized weakness.  Patient had taken aspirin and Plavix the morning of presentation.  In the ED, initially, patient had hypotension with blood pressure 82/53, BP improved to 100/50 after giving 1 L normal saline in ED. patient was noted to have a hemoglobin of 10.2.  GI was consulted and patient was admitted hospital for further evaluation and treatment.  Assessment and plan.  GI bleeding, acute blood loss anemia and iron deficiency anemia: History of ischemic colitis AV malformation of the colon.  Hemoglobin has down trended from 12.9 to 8.5 at this time.  Was initially hypotensive and responded with IV fluids.  GI has been consulted.  Continue to hold aspirin and Plavix.  Continue IV Protonix, antiemetics.  Will transfuse for hemoglobin less than 7 or ongoing bleeding.  Nuclear medicine GI blood loss scan was done without any active findings of bleeding.  Spoke with the patient and patient's daughter at bedside.  Hemoglobin has continued to drift down and continues to have melanotic stools.  At this time patient as well as patient's daughter at bedside wishes to proceed with colonoscopic evaluation.  Will let GI team know about it.  Continue to hold aspirin and Plavix.    Stroke [facility]) Recent stroke.  Patient was recently discharged home with home health on 08/13/2022 on aspirin and Plavix and Lipitor.  Currently on hold.  Discussed  about the risk versus benefits of dual antiplatelets.  Patient used to be on aspirin in the past.  Possibility single agent with Plavix on discharge could be an alternative but will continue to hold now due to melanotic stools and need for GI intervention.  Benign essential HTN Patient is on HCTZ, Cozaar, atenolol at home.  Currently on hold due to hypotension.   Chronic hyponatremia:  Sodium today at 134.  Will continue to monitor.   Chronic obstructive pulmonary disease (HCC) Compensated.  Continue as needed albuterol.   Hyperlipidemia) Continue Lipitor   Chronic diastolic CHF (congestive heart failure) ([place]):  2D echo on 08/12/2022 showed EF of 55 to 60% with grade 1 diastolic dysfunction.  Compensated at this time.     GERD (gastroesophageal reflux disease) Continue IV Protonix.  Generalized weakness, deconditioning.   Will get PT evaluation.  Patient has currently been living with her daughter after her recent hospitalization for stroke.  Burning micturition.  Urinalysis without any signs of infection.      DVT prophylaxis: SCDs Start: 08/16/22 1623   Code Status:     Code Status: DNR  Disposition: Home likely in 1 to 2 days.  Status is: Inpatient  Remains inpatient appropriate because: GI bleed, possible need for colonoscopy evaluation.   Family Communication: I again spoke with the patient's daughter at bedside and updated her about the clinical condition of the patient..  Consultants:  GI  Procedures:  None yet  Antimicrobials:  None  Anti-infectives (From admission, onward)    None      Subjective: Today, patient was seen and examined at bedside.  Reported to have  melanotic stools.  Denies any dizziness lightheadedness chest pain shortness of breath.    Objective: Vitals:   08/19/22 0424 08/19/22 0759 08/19/22 1104 08/19/22 1119  BP: 135/64 127/72 (!) 121/56 (!) 130/58  Pulse: 76 90 78 75  Resp: '18 16 20 19  '$ Temp: 98.7 F (37.1 C) 98.4 F  (36.9 C) 98.3 F (36.8 C) 98 F (36.7 C)  TempSrc: Oral Oral Oral   SpO2: 98% 95% 95% 96%  Weight: 62.2 kg     Height:        Intake/Output Summary (Last 24 hours) at 08/19/2022 1145 Last data filed at 08/19/2022 1478 Gross per 24 hour  Intake 2354.88 ml  Output --  Net 2354.88 ml    Filed Weights   08/17/22 1604 08/18/22 0500 08/19/22 0424  Weight: 66.1 kg 65.8 kg 62.2 kg    Physical Examination: Body mass index is 22.81 kg/m.   General:  Average built, not in obvious distress, mild hard of hearing, elderly female,  HENT: Mild pallor noted.  Oral mucosa is moist.   Chest:  Diminished breath sounds bilaterally. No crackles or wheezes.  CVS: S1 &S2 heard. No murmur.  Regular rate and rhythm. Abdomen: Soft,  nondistended.  Bowel sounds are heard.   Extremities: No cyanosis, clubbing or edema.  Peripheral pulses are palpable. Psych: Alert, awake and oriented, CNS:  No cranial nerve deficits.  Power equal in all extremities.   Skin: Warm and dry.  No rashes noted.  Data Reviewed:   CBC: Recent Labs  Lab 08/17/22 1137 08/17/22 1657 08/18/22 0607 08/18/22 1325 08/19/22 0525  WBC 4.4 4.9 3.9* 5.8 4.1  HGB 9.1* 9.8* 9.1* 9.7* 8.5*  HCT 27.6* 28.7* 26.3* 28.7* 25.0*  MCV 96.5 95.0 92.6 93.5 92.6  PLT 168 173 165 200 168     Basic Metabolic Panel: Recent Labs  Lab 08/16/22 1430 08/17/22 0410 08/18/22 0607 08/19/22 0525  NA 130* 136 133* 134*  K 4.7 4.1 3.6 3.5  CL 101 105 103 106  CO2 '25 25 26 25  '$ GLUCOSE 114* 99 93 104*  BUN '14 12 11 10  '$ CREATININE 0.62 0.45 0.45 0.43*  CALCIUM 8.5* 8.1* 8.0* 8.2*  MG  --   --  1.8  --      Liver Function Tests: Recent Labs  Lab 08/16/22 1430  AST 26  ALT 15  ALKPHOS 72  BILITOT 0.4  PROT 7.2  ALBUMIN 4.0      Radiology Studies: NM GI Blood Loss  Result Date: 08/17/2022 CLINICAL DATA:  Gastrointestinal bleeding. EXAM: NUCLEAR MEDICINE GASTROINTESTINAL BLEEDING SCAN TECHNIQUE: Sequential abdominal  images were obtained following intravenous administration of Tc-58mlabeled red blood cells. RADIOPHARMACEUTICALS:  20.2 mCi Tc-91mertechnetate in-vitro labeled red cells. COMPARISON:  None Available. FINDINGS: 2 hour dynamic planar imaging of the abdomen pelvis demonstrates no accumulation of tagged red blood cells within the gastrointestinal tract. Physiologic activity is noted within the blood pool and solid organs. IMPRESSION: No evidence of active gastrointestinal bleeding. Electronically Signed   By: StSuzy Bouchard.D.   On: 08/17/2022 14:35      LOS: 3 days    LaFlora LippsMD [facility] Available via Epic secure chat 7am-7pm After these hours, please refer to coverage provider listed on [email or web address] 08/19/2022, 11:45 AM

## 2022-08-19 NOTE — Progress Notes (Signed)
Brief GI Care Plan Note  Contacted by hospitalist service as patient and her daughter have changed their mind regarding endoscopy. Initially seen in consultation by [facility] (Dr. Vicente Males).  Was able to speak to patient's daughter, Allison Hampton, on the phone regarding procedures. Pt has had negative tagged rbc scan as she initially did not want to undergo any endoscopy. Since then, she has continued to have melenic stool and slow downtrend of hgb. Her asa and plavix have continued to be held and her vital signs have remained stable.  Patient and daughter have reconsidered and would like to pursue endoscopy. Given melena, I have recommended upper endoscopy and colonoscopy to be performed as the plan is to reinitiate at least anti-platelet therapy to the patient.  I discussed these procedures, including the risks, benefits and alternatives, with Allison Hampton who has verbalized understanding. She was given an opportunity for questions, all of which were answered.   Plan: Change to clear liquids. NPO at midnight Continue iv protonix 40 mg q12h Will start GoLytely prep this afternoon. OK to drink after npo order at midnight. If stool not see through by the time of finishing the first gallon, please provide second ordered prn.  Esophagogastroduodenoscopy and Colonoscopy planned for tomorrow pending patient stability and endoscopy suite availability Labs in am- bmp, cbc Hold dvt ppx. Plavix and asa also on hold Monitor H&H.  Transfusion and resuscitation as per primary team Avoid frequent lab draws to prevent lab induced anemia Supportive care and antiemetics as per primary team Maintain two sites IV access Avoid nsaids Monitor for GIB.  Esophagogastroduodenoscopy and Colonoscopy with possible biopsy, control of bleeding, polypectomy, and interventions as necessary has been discussed with the patient/patient representative. Informed consent was obtained from the patient/patient representative after  explaining the indication, nature, and risks of the procedure including but not limited to death, bleeding, perforation, missed neoplasm/lesions, cardiorespiratory compromise, and reaction to medications. Opportunity for questions was given and appropriate answers were provided. Patient/patient representative has verbalized understanding is amenable to undergoing the procedure.  Dr. Allen Hampton will be resuming GI care on this patient tomorrow  Allison Hampton, [facility]

## 2022-08-19 NOTE — Evaluation (Signed)
Physical Therapy Evaluation Patient Details Name: Allison Hampton MRN: [id number] DOB: Jan 22, 1928 Today's Date: 08/19/2022  History of Present Illness  Pt is a 88 y/o F admitted on 08/16/22 after presenting with c/o multiple episodes of bright rectal bleeding with lower abdominal cramps, lightheadedness, dizziness & generalized weakness. Pt is being treated for GI bleeding, ABLA, & iron deficiency anemia. PMH: ischemic colitis, AV malformation of colon, HTN, HLD, COPD, stroke, GERD, anxiety, tobacco abuse, iron deficiency anemia, chronic hyponatremia, diastolic CHF, recent admission for stroke   Clinical Impression  Pt seen for PT evaluation with pt agreeable. Pt reports prior to initial hospitalization she was independent without AD, living in [place] apartment but since d/c has been residing with her daughter & ambulating with RW. On this date, pt demonstrated decreased balance during STS & step pivot bed>recliner without AD. PT recommends pt use RW for safe ambulation & pt is able to ambulate in the hallway with RW & supervision. Recommend pt d/c home with assistance & HHPT f/u. Will continue to follow pt acutely to address balance, endurance, and gait with LRAD.   Recommendations for follow up therapy are one component of a multi-disciplinary discharge planning process, led by the attending physician.  Recommendations may be updated based on patient status, additional functional criteria and insurance authorization.  Follow Up Recommendations Home health PT      Assistance Recommended at Discharge Intermittent Supervision/Assistance  Patient can return home with the following  A little help with walking and/or transfers;A little help with bathing/dressing/bathroom;Assistance with cooking/housework;Assist for transportation;Help with stairs or ramp for entrance    Equipment Recommendations None recommended by PT  Recommendations for Other Services       Functional Status Assessment  Patient has had a recent decline in their functional status and demonstrates the ability to make significant improvements in function in a reasonable and predictable amount of time.     Precautions / Restrictions Precautions Precautions: Fall Restrictions Weight Bearing Restrictions: No      Mobility  Bed Mobility Overal bed mobility: Modified Independent Bed Mobility: Supine to Sit     Supine to sit: Modified independent (Device/Increase time)          Transfers Overall transfer level: Needs assistance Equipment used: None Transfers: Sit to/from Stand, Bed to chair/wheelchair/BSC Sit to Stand: Min assist, Supervision   Step pivot transfers: Min assist       General transfer comment: STS from EOB without AD with min assist (2 attempts, as on 1st attempt pt with LOB back onto bed), stand pivot bed>recliner without AD & min assist with decreased balance & poor eccentric lowering. STS with RW & cuing for hand placement with supervision.    Ambulation/Gait Ambulation/Gait assistance: Supervision Gait Distance (Feet): 100 Feet Assistive device: Rolling walker (2 wheels) Gait Pattern/deviations: WFL(Within Functional Limits) Gait velocity: slightly decreased     General Gait Details: Education re: need to ambulate within base of AD.  Stairs            Wheelchair Mobility    Modified Rankin (Stroke Patients Only)       Balance Overall balance assessment: Needs assistance Sitting-balance support: Feet supported Sitting balance-Leahy Scale: Good     Standing balance support: No upper extremity supported Standing balance-Leahy Scale: Poor                               Pertinent Vitals/Pain Pain Assessment Pain Assessment: Faces Faces  Pain Scale: Hurts a little bit Pain Location: chronic R knee pain Pain Descriptors / Indicators: Discomfort Pain Intervention(s): Monitored during session    Home Living Family/patient expects to be  discharged to:: Private residence Living Arrangements: Alone (pt lives alone in ILF but plans to return to daughter's house where she was staying following previous hospital admission, home set up is for her daughter's house) Available Help at Discharge: Family;Available PRN/intermittently Type of Home: House Home Access: Level entry       Home Layout: One level Home Equipment: Cane - single point;Rollator (4 wheels);Grab bars - tub/shower;Rolling Walker (2 wheels) Additional Comments: Prior to first hospitalization pt was living alone in ILF apartment/house (1 level, level entry)    Prior Function Prior Level of Function : Independent/Modified Independent             Mobility Comments: Prior to first hospitalization pt was independent without AD. Following previous hospitalization pt was ambulatory with RW, daughter assisted with negotiating 2 steps without rails into daughter's home, denies falls.       Hand Dominance        Extremity/Trunk Assessment   Upper Extremity Assessment Upper Extremity Assessment: Overall WFL for tasks assessed    Lower Extremity Assessment Lower Extremity Assessment: Generalized weakness (pt c/o BLE feet feeling more numb than usual, as well as "fat" and puffy)    Cervical / Trunk Assessment Cervical / Trunk Assessment: Normal  Communication   Communication: HOH  Cognition Arousal/Alertness: Awake/alert Behavior During Therapy: WFL for tasks assessed/performed Overall Cognitive Status: Within Functional Limits for tasks assessed                                          General Comments      Exercises     Assessment/Plan    PT Assessment Patient needs continued PT services  PT Problem List Decreased strength;Decreased activity tolerance;Decreased balance;Decreased mobility;Decreased knowledge of precautions;Decreased knowledge of use of DME       PT Treatment Interventions DME instruction;Therapeutic  exercise;Gait training;Balance training;Stair training;Neuromuscular re-education;Functional mobility training;Therapeutic activities;Patient/family education    PT Goals (Current goals can be found in the Care Plan section)  Acute Rehab PT Goals Patient Stated Goal: to return home PT Goal Formulation: With patient Time For Goal Achievement: 09/02/22 Potential to Achieve Goals: Good    Frequency Min 2X/week     Co-evaluation               AM-PAC PT "6 Clicks" Mobility  Outcome Measure Help needed turning from your back to your side while in a flat bed without using bedrails?: None Help needed moving from lying on your back to sitting on the side of a flat bed without using bedrails?: None Help needed moving to and from a bed to a chair (including a wheelchair)?: A Little Help needed standing up from a chair using your arms (e.g., wheelchair or bedside chair)?: A Little Help needed to walk in hospital room?: A Little Help needed climbing 3-5 steps with a railing? : A Little 6 Click Score: 20    End of Session   Activity Tolerance: Patient tolerated treatment well Patient left: in chair;with family/visitor present;with call bell/phone within reach Nurse Communication: Mobility status PT Visit Diagnosis: Unsteadiness on feet (R26.81);Other abnormalities of gait and mobility (R26.89);Muscle weakness (generalized) (M62.81)    Time: 3570-1779 PT Time Calculation (min) (ACUTE ONLY):  15 min   Charges:   PT Evaluation $PT Eval Low Complexity: Olivia Lopez de Gutierrez, PT, DPT 08/19/22, 11:47 AM   Waunita Schooner 08/19/2022, 11:44 AM

## 2022-08-20 ENCOUNTER — Inpatient Hospital Stay: Payer: Medicare Other | Admitting: Registered Nurse

## 2022-08-20 ENCOUNTER — Encounter: Payer: Self-pay | Admitting: Internal Medicine

## 2022-08-20 ENCOUNTER — Encounter
Admission: EM | Disposition: A | Discharge status: Home Health | Payer: Self-pay | Source: Home / Self Care | Attending: Internal Medicine

## 2022-08-20 DIAGNOSIS — D62 Acute posthemorrhagic anemia: Secondary | ICD-10-CM | POA: Diagnosis not present

## 2022-08-20 DIAGNOSIS — K625 Hemorrhage of anus and rectum: Secondary | ICD-10-CM | POA: Diagnosis not present

## 2022-08-20 DIAGNOSIS — K922 Gastrointestinal hemorrhage, unspecified: Secondary | ICD-10-CM | POA: Diagnosis not present

## 2022-08-20 DIAGNOSIS — D509 Iron deficiency anemia, unspecified: Secondary | ICD-10-CM | POA: Diagnosis not present

## 2022-08-20 DIAGNOSIS — K31819 Angiodysplasia of stomach and duodenum without bleeding: Secondary | ICD-10-CM | POA: Diagnosis not present

## 2022-08-20 DIAGNOSIS — K5520 Angiodysplasia of colon without hemorrhage: Secondary | ICD-10-CM

## 2022-08-20 HISTORY — PX: ESOPHAGOGASTRODUODENOSCOPY (EGD) WITH PROPOFOL: SHX5813

## 2022-08-20 HISTORY — PX: COLONOSCOPY WITH PROPOFOL: SHX5780

## 2022-08-20 LAB — BASIC METABOLIC PANEL
Anion gap: 8 (ref 5–15)
BUN: 8 mg/dL (ref 8–23)
CO2: 23 mmol/L (ref 22–32)
Calcium: 8.3 mg/dL — ABNORMAL LOW (ref 8.9–10.3)
Chloride: 105 mmol/L (ref 98–111)
Creatinine, Ser: 0.38 mg/dL — ABNORMAL LOW (ref 0.44–1.00)
GFR, Estimated: >60 mL/min (ref >60)
Glucose, Bld: 112 mg/dL — ABNORMAL HIGH (ref 70–99)
Potassium: 3.5 mmol/L (ref 3.5–5.1)
Sodium: 136 mmol/L (ref 135–145)

## 2022-08-20 LAB — HEMOGLOBIN AND HEMATOCRIT, BLOOD
HCT: 24.2 % — ABNORMAL LOW (ref 36.0–46.0)
Hemoglobin: 8.2 g/dL — ABNORMAL LOW (ref 12.0–15.0)

## 2022-08-20 LAB — CBC
HCT: 24.3 % — ABNORMAL LOW (ref 36.0–46.0)
Hemoglobin: 8.3 g/dL — ABNORMAL LOW (ref 12.0–15.0)
MCH: 31.9 pg (ref 26.0–34.0)
MCHC: 34.2 g/dL (ref 30.0–36.0)
MCV: 93.5 fL (ref 80.0–100.0)
Platelets: 176 10*3/uL (ref 150–400)
RBC: 2.6 MIL/uL — ABNORMAL LOW (ref 3.87–5.11)
RDW: 12 % (ref 11.5–15.5)
WBC: 4.9 10*3/uL (ref 4.0–10.5)
nRBC: 0 % (ref 0.0–0.2)

## 2022-08-20 LAB — GLUCOSE, CAPILLARY: Glucose-Capillary: 112 mg/dL — ABNORMAL HIGH (ref 70–99)

## 2022-08-20 SURGERY — ESOPHAGOGASTRODUODENOSCOPY (EGD) WITH PROPOFOL
Anesthesia: General

## 2022-08-20 MED ORDER — PROPOFOL 1000 MG/100ML IV EMUL
INTRAVENOUS | Status: AC
  Filled 2022-08-20: qty 100

## 2022-08-20 MED ORDER — PROPOFOL 500 MG/50ML IV EMUL
INTRAVENOUS | Status: DC | PRN
  Administered 2022-08-20: 125 ug/kg/min via INTRAVENOUS

## 2022-08-20 MED ORDER — LIDOCAINE 2% (20 MG/ML) 5 ML SYRINGE
INTRAMUSCULAR | Status: DC | PRN
  Administered 2022-08-20: 100 mg via INTRAVENOUS

## 2022-08-20 MED ORDER — LIDOCAINE HCL (PF) 2 % IJ SOLN
INTRAMUSCULAR | Status: AC
  Filled 2022-08-20: qty 5

## 2022-08-20 MED ORDER — PROPOFOL 10 MG/ML IV BOLUS
INTRAVENOUS | Status: DC | PRN
  Administered 2022-08-20: 70 mg via INTRAVENOUS

## 2022-08-20 NOTE — Progress Notes (Signed)
PROGRESS NOTE    Allison Hampton  XBD:[id number] DOB: 01/20/1928 DOA: 08/16/2022 PCP: Derinda Late, MD    Brief Narrative:  Allison Hampton is a 88 years old female with past medical history of ischemic colitis, AV malformation of colon,hypertension, hyperlipidemia, COPD, stroke, GERD, anxiety, tobacco abuse, iron deficiency anemia, chronic hyponatremia, diastolic CHF, recent admission for stroke Plavix and aspirin, presented to hospital with multiple episodes of bright red rectal bleeding with lower abdominal cramps, lightheadedness dizziness and generalized weakness.  Patient had taken aspirin and Plavix the morning of presentation.  In the ED, initially, patient had hypotension with blood pressure 82/53, BP improved to 100/50 after giving 1 L normal saline in ED. patient was noted to have a hemoglobin of 10.2.  GI was consulted and patient was admitted hospital for further evaluation and treatment.  Assessment and plan.  GI bleeding, acute blood loss anemia and iron deficiency anemia: History of ischemic colitis, AV malformation of the colon.  Hemoglobin has down trended from 12.9 to 8.3 at this time.  Was initially hypotensive and responded with IV fluids.  Continue to hold aspirin and Plavix.  Continue IV Protonix, antiemetics.  Will transfuse for hemoglobin less than 7 or ongoing bleeding.  GI bleed scan was negative.  Plan for endoscopic evaluation today as per GI.  Has had bowel preparation.  Stroke [facility]) Recent stroke.  Patient was recently discharged home with home health on 08/13/2022 on aspirin and Plavix and Lipitor.  Currently on hold.  Benign essential HTN Patient is on HCTZ, Cozaar, atenolol at home.  Currently on hold due to hypotension.   Chronic hyponatremia:  Sodium actually improved at 136.  Will continue to monitor.   Chronic obstructive pulmonary disease (HCC) Compensated.  Continue as needed albuterol.   Hyperlipidemia) Continue Lipitor   Chronic  diastolic CHF (congestive heart failure) ([place]):  2D echo on 08/12/2022 showed EF of 55 to 60% with grade 1 diastolic dysfunction.  Compensated at this time.     GERD (gastroesophageal reflux disease) Continue IV Protonix.  Generalized weakness, deconditioning.    Patient has currently been living with her daughter after her recent hospitalization for stroke.  Physical therapy has seen the patient and recommend home health on discharge.  Burning micturition.  Urinalysis without any signs of infection.      DVT prophylaxis: SCDs Start: 08/16/22 1623   Code Status:     Code Status: DNR  Disposition: Home with home health likely in 1 to 2 days.  Status is: Inpatient  Remains inpatient appropriate because: GI bleed, plan for endoscopic evaluation.     Family Communication: I spoke with the patient's daughter at bedside.    Consultants:  GI  Procedures:  None yet  Antimicrobials:  None  Anti-infectives (From admission, onward)    None      Subjective: Today, patient was seen and examined at bedside.  Has had bowel preparation overnight and is having clear bowels.  Awaiting for endoscopic evaluation.  Denies any chest pain, shortness of breath dizziness lightheadedness.  Patient daughter at bedside.   Objective: Vitals:   08/19/22 2340 08/20/22 0402 08/20/22 0441 08/20/22 0813  BP: 136/64 (!) 125/51  123/71  Pulse: 82 78  88  Resp:  16  16  Temp: 97.9 F (36.6 C) 98.8 F (37.1 C)  97.9 F (36.6 C)  TempSrc: Oral Oral    SpO2: 94% 95%  93%  Weight:   61.2 kg   Height:  Intake/Output Summary (Last 24 hours) at 08/20/2022 1121 Last data filed at 08/20/2022 0422 Gross per 24 hour  Intake 2224.38 ml  Output --  Net 2224.38 ml    Filed Weights   08/18/22 0500 08/19/22 0424 08/20/22 0441  Weight: 65.8 kg 62.2 kg 61.2 kg    Physical Examination: Body mass index is 22.45 kg/m.   General: Average built, elderly female, not in obvious distress,   HENT: Mild pallor noted.  Oral cavity is moist.  No pharyngeal erythema. Chest:  Diminished breath sounds bilaterally. No crackles or wheezes.  CVS: S1 &S2 heard. No murmur.  Regular rate and rhythm. Abdomen: Soft,  nondistended.  Bowel sounds are heard.   Extremities: No cyanosis, clubbing or edema.  Peripheral pulses are palpable. Psych: Alert, awake and oriented, Communicative, CNS:  No cranial nerve deficits.  Power equal in all extremities.   Skin: Warm and dry.  No rashes noted.  Data Reviewed:   CBC: Recent Labs  Lab 08/17/22 1657 08/18/22 0607 08/18/22 1325 08/19/22 0525 08/19/22 1931 08/20/22 0248  WBC 4.9 3.9* 5.8 4.1  --  4.9  HGB 9.8* 9.1* 9.7* 8.5* 9.2* 8.3*  HCT 28.7* 26.3* 28.7* 25.0* 26.8* 24.3*  MCV 95.0 92.6 93.5 92.6  --  93.5  PLT 173 165 200 168  --  176     Basic Metabolic Panel: Recent Labs  Lab 08/16/22 1430 08/17/22 0410 08/18/22 0607 08/19/22 0525 08/20/22 0248  NA 130* 136 133* 134* 136  K 4.7 4.1 3.6 3.5 3.5  CL 101 105 103 106 105  CO2 '25 25 26 25 23  '$ GLUCOSE 114* 99 93 104* 112*  BUN '14 12 11 10 8  '$ CREATININE 0.62 0.45 0.45 0.43* 0.38*  CALCIUM 8.5* 8.1* 8.0* 8.2* 8.3*  MG  --   --  1.8  --   --      Liver Function Tests: Recent Labs  Lab 08/16/22 1430  AST 26  ALT 15  ALKPHOS 72  BILITOT 0.4  PROT 7.2  ALBUMIN 4.0      Radiology Studies: No results found.    LOS: 4 days    Flora Lipps, MD [facility] Available via Epic secure chat 7am-7pm After these hours, please refer to coverage provider listed on [email or web address] 08/20/2022, 11:21 AM

## 2022-08-20 NOTE — Progress Notes (Signed)
Pt completed 1 jug of bowel prep. Pt is now having stools that appear like clear yellow with a small amount of coffee ground size sediment.

## 2022-08-20 NOTE — Anesthesia Preprocedure Evaluation (Signed)
Anesthesia Evaluation  Patient identified by MRN, date of birth, ID band Patient awake    Reviewed: Allergy & Precautions, H&P , NPO status , Patient's Chart, lab work & pertinent test results, reviewed documented beta blocker date and time   History of Anesthesia Complications Negative for: history of anesthetic complications  Airway Mallampati: III  TM Distance: >3 FB Neck ROM: full    Dental  (+) Dental Advidsory Given, Partial Upper, Partial Lower, Implants, Caps, Teeth Intact   Pulmonary neg shortness of breath, neg sleep apnea, COPD, neg recent URI, Current Smoker   Pulmonary exam normal breath sounds clear to auscultation       Cardiovascular Exercise Tolerance: Good hypertension, (-) angina +CHF  (-) Past MI and (-) Cardiac Stents Normal cardiovascular exam+ dysrhythmias + Valvular Problems/Murmurs MR  Rhythm:regular Rate:Normal     Neuro/Psych neg Seizures CVA (R hand weakness), Residual Symptoms  negative psych ROS   GI/Hepatic Neg liver ROS,GERD  ,,  Endo/Other  negative endocrine ROS    Renal/GU negative Renal ROS  negative genitourinary   Musculoskeletal   Abdominal   Peds  Hematology  (+) Blood dyscrasia, anemia   Anesthesia Other Findings Past Medical History: No date: Abdominal mass No date: Actinic keratosis No date: Atrophic vaginitis No date: Colitis, ischemic (HCC) No date: GERD (gastroesophageal reflux disease) No date: Pelvic pain in female No date: Pyometra ~2/21: Skin cancer     Comment:  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               George Ina and pt referred to [facility] for txt 10/20/2018: Squamous cell carcinoma of skin     Comment:  L hand dorsum 07/21/2018: Squamous cell carcinoma of skin     Comment:  R pretibial below knww 12/29/2014: Squamous cell carcinoma of skin     Comment:  SCC IS L dorsum foot 08/17/2014: Squamous cell carcinoma of skin     Comment:  L  lat calf 04/01/2014: Squamous cell carcinoma of skin     Comment:  L upper arm 05/18/2009: Squamous cell carcinoma of skin     Comment:  L dorsal hand 10/02/2021: Squamous cell carcinoma of skin     Comment:  L mid pretibia, [facility] 06/11/2022: Squamous cell carcinoma of skin     Comment:  left foot dorsum, [facility] 07/31/2022: Squamous cell carcinoma of skin     Comment:  L dorsum foot, recurrent, needs Mohs with Dr. Manley Mason No date: Uterine mass   Reproductive/Obstetrics negative OB ROS                             Anesthesia Physical Anesthesia Plan  ASA: 3  Anesthesia Plan: General   Post-op Pain Management:    Induction: Intravenous  PONV Risk Score and Plan: 2 and Propofol infusion and TIVA  Airway Management Planned: Natural Airway and Nasal Cannula  Additional Equipment:   Intra-op Plan:   Post-operative Plan:   Informed Consent: I have reviewed the patients History and Physical, chart, labs and discussed the procedure including the risks, benefits and alternatives for the proposed anesthesia with the patient or authorized representative who has indicated his/her understanding and acceptance.     Dental Advisory Given  Plan Discussed with: Anesthesiologist, CRNA and Surgeon  Anesthesia Plan Comments:         Anesthesia Quick Evaluation

## 2022-08-20 NOTE — Op Note (Signed)
[facility]astroenterology Patient Name: Allison Hampton Procedure Date: 08/20/2022 12:26 PM MRN: [id number] Account #: [id number] Date of Birth: 06-22-1928 Admit Type: Inpatient Age: 88 Room: [facility] ENDO ROOM 2 Gender: Female Note Status: Finalized Instrument Name: Jasper Riling [id number] Procedure:             Colonoscopy Indications:           Acute post hemorrhagic anemia Providers:             Lucilla Lame MD, MD Medicines:             Propofol per Anesthesia Complications:         No immediate complications. Procedure:             Pre-Anesthesia Assessment:                        - Prior to the procedure, a History and Physical was                         performed, and patient medications and allergies were                         reviewed. The patient's tolerance of previous                         anesthesia was also reviewed. The risks and benefits                         of the procedure and the sedation options and risks                         were discussed with the patient. All questions were                         answered, and informed consent was obtained. Prior                         Anticoagulants: The patient has taken no anticoagulant                         or antiplatelet agents. ASA Grade Assessment: III - A                         patient with severe systemic disease. After reviewing                         the risks and benefits, the patient was deemed in                         satisfactory condition to undergo the procedure.                        After obtaining informed consent, the colonoscope was                         passed under direct vision. Throughout the procedure,                         the patient's blood pressure,  pulse, and oxygen                         saturations were monitored continuously. The                         Colonoscope was introduced through the anus and                         advanced to the the cecum,  identified by appendiceal                         orifice and ileocecal valve. The colonoscopy was                         performed without difficulty. The patient tolerated                         the procedure well. The quality of the bowel                         preparation was good. Findings:      The perianal and digital rectal examinations were normal.      Multiple medium-sized angiodysplastic lesions with bleeding were found       in the cecum. For hemostasis, two hemostatic clips were successfully       placed (MR conditional). There was no bleeding at the end of the       procedure. Coagulation for hemostasis using argon plasma at 2       liters/minute and 45 watts was successful.      Multiple small-mouthed diverticula were found in the entire colon.      Non-bleeding internal hemorrhoids were found during retroflexion. The       hemorrhoids were Grade II (internal hemorrhoids that prolapse but reduce       spontaneously). Impression:            - Multiple bleeding colonic angiodysplastic lesions.                         Clips (MR conditional) were placed. Treated with argon                         plasma coagulation (APC).                        - Diverticulosis in the entire examined colon.                        - Non-bleeding internal hemorrhoids.                        - No specimens collected. Recommendation:        - Discharge patient to home.                        - Resume previous diet.                        - Continue present medications.                        -  Await pathology results. Procedure Code(s):     --- Professional ---                        [phone], Colonoscopy, flexible; with control of                         bleeding, any method Diagnosis Code(s):     --- Professional ---                        D62, Acute posthemorrhagic anemia                        K55.21, Angiodysplasia of colon with hemorrhage CPT copyright 2022 American [employer] Association.  All rights reserved. The codes documented in this report are preliminary and upon coder review may  be revised to meet current compliance requirements. Lucilla Lame MD, MD 08/20/2022 1:01:20 PM This report has been signed electronically. Number of Addenda: 0 Note Initiated On: 08/20/2022 12:26 PM Scope Withdrawal Time: 0 hours 11 minutes 35 seconds  Total Procedure Duration: 0 hours 18 minutes 51 seconds  Estimated Blood Loss:  Estimated blood loss: none.      [facility]

## 2022-08-20 NOTE — Op Note (Signed)
[facility] Patient Name: Allison Hampton Procedure Date: 08/20/2022 12:26 PM MRN: [id number] Account #: [id number] Date of Birth: 1928/09/01 Admit Type: Inpatient Age: 88 Room: [facility] ENDO ROOM 2 Gender: Female Note Status: Finalized Instrument Name: Altamese Cabal Endoscope [id number] Procedure:             Upper GI endoscopy Indications:           Acute post hemorrhagic anemia Providers:             Lucilla Lame MD, MD Medicines:             Propofol per Anesthesia Complications:         No immediate complications. Procedure:             Pre-Anesthesia Assessment:                        - Prior to the procedure, a History and Physical was                         performed, and patient medications and allergies were                         reviewed. The patient's tolerance of previous                         anesthesia was also reviewed. The risks and benefits                         of the procedure and the sedation options and risks                         were discussed with the patient. All questions were                         answered, and informed consent was obtained. Prior                         Anticoagulants: The patient has taken no anticoagulant                         or antiplatelet agents. ASA Grade Assessment: III - A                         patient with severe systemic disease. After reviewing                         the risks and benefits, the patient was deemed in                         satisfactory condition to undergo the procedure.                        After obtaining informed consent, the endoscope was                         passed under direct vision. Throughout the procedure,                         the  patient's blood pressure, pulse, and oxygen                         saturations were monitored continuously. The Endoscope                         was introduced through the mouth, and advanced to the                          second part of duodenum. The upper GI endoscopy was                         accomplished without difficulty. The patient tolerated                         the procedure well. Findings:      A small hiatal hernia was present.      A single 6 mm angiodysplastic lesion with no bleeding was found in the       gastric body. Coagulation for tissue destruction using argon plasma at 2       liters/minute and 60 watts was successful.      The examined duodenum was normal. Impression:            - Small hiatal hernia.                        - A single non-bleeding angiodysplastic lesion in the                         stomach. Treated with argon plasma coagulation (APC).                        - Normal examined duodenum.                        - No specimens collected. Recommendation:        - Return patient to hospital ward for ongoing care.                        - Resume previous diet.                        - Continue present medications.                        - Perform a colonoscopy today. Procedure Code(s):     --- Professional ---                        [phone], Esophagogastroduodenoscopy, flexible,                         transoral; with ablation of tumor(s), polyp(s), or                         other lesion(s) (includes pre- and post-dilation and                         guide wire passage, when performed) Diagnosis Code(s):     --- Professional ---  D62, Acute posthemorrhagic anemia                        K31.819, Angiodysplasia of stomach and duodenum                         without bleeding CPT copyright 2022 American Medical Association. All rights reserved. The codes documented in this report are preliminary and upon coder review may  be revised to meet current compliance requirements. Lucilla Lame MD, MD 08/20/2022 12:39:08 PM This report has been signed electronically. Number of Addenda: 0 Note Initiated On: 08/20/2022 12:26 PM Estimated Blood Loss:   Estimated blood loss: none.      [facility]

## 2022-08-20 NOTE — Care Management Important Message (Signed)
Important Message  Patient Details  Name: Allison Hampton MRN: [id number] Date of Birth: August 24, 1928   Medicare Important Message Given:  N/A - LOS <3 / Initial given by admissions     Dannette Barbara 08/20/2022, 12:10 PM

## 2022-08-20 NOTE — Anesthesia Postprocedure Evaluation (Signed)
Anesthesia Post Note  Patient: Allison Hampton  Procedure(s) Performed: ESOPHAGOGASTRODUODENOSCOPY (EGD) WITH PROPOFOL COLONOSCOPY WITH PROPOFOL  Patient location during evaluation: Endoscopy Anesthesia Type: General Level of consciousness: awake and alert Pain management: pain level controlled Vital Signs Assessment: post-procedure vital signs reviewed and stable Respiratory status: spontaneous breathing, nonlabored ventilation, respiratory function stable and patient connected to nasal cannula oxygen Cardiovascular status: blood pressure returned to baseline and stable Postop Assessment: no apparent nausea or vomiting Anesthetic complications: no   No notable events documented.   Last Vitals:  Vitals:   08/20/22 1314 08/20/22 1324  BP: (!) 128/48 (!) 136/96  Pulse: 75 78  Resp: 13 16  Temp:    SpO2: 100% 100%    Last Pain:  Vitals:   08/20/22 1324  TempSrc:   PainSc: 0-No pain                 Martha Clan

## 2022-08-20 NOTE — Transfer of Care (Signed)
Immediate Anesthesia Transfer of Care Note  Patient: Allison Hampton  Procedure(s) Performed: ESOPHAGOGASTRODUODENOSCOPY (EGD) WITH PROPOFOL COLONOSCOPY WITH PROPOFOL  Patient Location: Endoscopy Unit  Anesthesia Type:General  Level of Consciousness: awake, alert , and oriented  Airway & Oxygen Therapy: Patient Spontanous Breathing  Post-op Assessment: Report given to RN and Post -op Vital signs reviewed and stable  Post vital signs: Reviewed and stable  Last Vitals:  Vitals Value Taken Time  BP    Temp    Pulse    Resp    SpO2      Last Pain:  Vitals:   08/20/22 0800  TempSrc:   PainSc: 0-No pain         Complications: No notable events documented.

## 2022-08-21 ENCOUNTER — Encounter: Payer: Self-pay | Admitting: Gastroenterology

## 2022-08-21 DIAGNOSIS — K625 Hemorrhage of anus and rectum: Secondary | ICD-10-CM | POA: Diagnosis not present

## 2022-08-21 DIAGNOSIS — D509 Iron deficiency anemia, unspecified: Secondary | ICD-10-CM | POA: Diagnosis not present

## 2022-08-21 DIAGNOSIS — D62 Acute posthemorrhagic anemia: Secondary | ICD-10-CM | POA: Diagnosis not present

## 2022-08-21 DIAGNOSIS — K922 Gastrointestinal hemorrhage, unspecified: Secondary | ICD-10-CM | POA: Diagnosis not present

## 2022-08-21 LAB — BASIC METABOLIC PANEL
Anion gap: 6 (ref 5–15)
BUN: 5 mg/dL — ABNORMAL LOW (ref 8–23)
CO2: 26 mmol/L (ref 22–32)
Calcium: 8.4 mg/dL — ABNORMAL LOW (ref 8.9–10.3)
Chloride: 105 mmol/L (ref 98–111)
Creatinine, Ser: 0.55 mg/dL (ref 0.44–1.00)
GFR, Estimated: >60 mL/min (ref >60)
Glucose, Bld: 110 mg/dL — ABNORMAL HIGH (ref 70–99)
Potassium: 3.3 mmol/L — ABNORMAL LOW (ref 3.5–5.1)
Sodium: 137 mmol/L (ref 135–145)

## 2022-08-21 LAB — HEMOGLOBIN AND HEMATOCRIT, BLOOD
HCT: 24.7 % — ABNORMAL LOW (ref 36.0–46.0)
Hemoglobin: 8.4 g/dL — ABNORMAL LOW (ref 12.0–15.0)

## 2022-08-21 LAB — GLUCOSE, CAPILLARY: Glucose-Capillary: 108 mg/dL — ABNORMAL HIGH (ref 70–99)

## 2022-08-21 MED ORDER — PANTOPRAZOLE SODIUM 40 MG PO TBEC: 40.0000 mg | DELAYED_RELEASE_TABLET | Freq: Every day | ORAL | 1 refills | Status: AC

## 2022-08-21 MED ORDER — CLOPIDOGREL BISULFATE 75 MG PO TABS: 75.0000 mg | ORAL_TABLET | Freq: Every day | ORAL | 0 refills | Status: AC

## 2022-08-21 MED ORDER — POTASSIUM CHLORIDE CRYS ER 20 MEQ PO TBCR
40.0000 meq | EXTENDED_RELEASE_TABLET | Freq: Once | ORAL | Status: AC
  Administered 2022-08-21: 40 meq via ORAL
  Filled 2022-08-21: qty 2

## 2022-08-21 MED ORDER — ORAL CARE MOUTH RINSE: 15.0000 mL | OROMUCOSAL | Status: DC | PRN

## 2022-08-21 MED ORDER — ASPIRIN 81 MG PO CHEW: 81.0000 mg | CHEWABLE_TABLET | Freq: Every day | ORAL | Status: AC

## 2022-08-21 MED ORDER — POTASSIUM CHLORIDE CRYS ER 20 MEQ PO TBCR: 20.0000 meq | EXTENDED_RELEASE_TABLET | Freq: Every day | ORAL | 0 refills | Status: AC

## 2022-08-21 NOTE — TOC Transition Note (Signed)
Transition of Care [facility]) - CM/SW Discharge Note   Patient Details  Name: Allison Hampton MRN: [id number] Date of Birth: [birth year]  Transition of Care [facility]) CM/SW Contact:  Tiburcio Bash, LCSW Phone Number: 08/21/2022, 10:08 AM   Clinical Narrative:     Patient to dc back to [place] today with [facility] PT, CSW has informed Corene Cornea with [facility] of dc today. No further dc needs identified.    Final next level of care: [place] Barriers to Discharge: No Barriers Identified   Patient Goals and CMS Choice Patient states their goals for this hospitalization and ongoing recovery are:: to go home CMS Medicare.gov Compare Post Acute Care list provided to:: Patient Choice offered to / list presented to : Patient  Discharge Placement                       Discharge Plan and Services                          HH Arranged: PT [facility] Agency: [place] ([facility]) Date [place]: 08/21/22 Time [place]: 1008 Representative spoke with at [place]: [place] ([place]) Interventions     Readmission Risk Interventions     No data to display

## 2022-08-21 NOTE — Discharge Summary (Signed)
Physician Discharge Summary  Allison Hampton ZOX:[id number] DOB: [birth year] DOA: 08/16/2022  PCP: Derinda Late, MD  Admit date: 08/16/2022 Discharge date: 08/21/2022  Admitted From: Home  Discharge disposition: Home with [facility]  Recommendations for Outpatient Follow-Up:   Follow up with your primary care provider in one week.  Check CBC, BMP, magnesium in the next visit Follow-up with GI as outpatient in 2 to 3 weeks.  Discharge Diagnosis:   Principal Problem:   GI bleeding Active Problems:   Acute blood loss anemia   Iron deficiency anemia   Stroke (HCC)   Benign essential HTN   Chronic hyponatremia   Chronic obstructive pulmonary disease (HCC)   HLD (hyperlipidemia)   Chronic diastolic CHF (congestive heart failure) (HCC)   GERD (gastroesophageal reflux disease)   Rectal bleeding   AVM (arteriovenous malformation) of stomach, acquired   AVM (arteriovenous malformation) of colon   Discharge Condition: Improved.  Diet recommendation: Low sodium, heart healthy.    Wound care: None.  Code status: DNR  History of Present Illness:   Allison Hampton is a 88 years old female with past medical history of ischemic colitis, AV malformation of colon,hypertension, hyperlipidemia, COPD, stroke, GERD, anxiety, tobacco abuse, iron deficiency anemia, chronic hyponatremia, diastolic CHF, recent admission for stroke Plavix and aspirin, presented to hospital with multiple episodes of bright red rectal bleeding with lower abdominal cramps, lightheadedness dizziness and generalized weakness.  Patient had taken aspirin and Plavix the morning of presentation.  In the ED, initially, patient had hypotension with blood pressure 82/53, BP improved to 100/50 after giving 1 L normal saline in ED.  Patient was noted to have a hemoglobin of 10.2.  GI was consulted and patient was admitted hospital for further evaluation and treatment.   Hospital Course:   Following conditions were  addressed during hospitalization as listed below,  GI bleeding, acute blood loss anemia and iron deficiency anemia likely secondary to bleeding AV malformation of the colon.: History of ischemic colitis, AV malformation of the colon.  Hemoglobin has down trended from 12.9 to 8.3. Patient was initially hypotensive and responded with IV fluids.  Nuclear medicine bleeding scan was negative.  Patient continued to have GI bleed so GI was consulted and patient underwent endoscopic evaluation with endoscopy and colonoscopy.  Patient did have multiple medium-sized angiodysplastic lesions with bleeding were found in the cecum. For hemostasis, two hemostatic clips were successfully placed (MR conditional). There was no bleeding at the end of the procedure. Coagulation for hemostasis using argon plasma.Multiple small-mouthed diverticula were found in the entire colon.A single 6 mm angiodysplastic lesion with no bleeding was found in the gastric body. Coagulation for tissue destruction using argon plasma was also performed.  Communicated with Dr. Verl Blalock GI and at this time okay with resuming aspirin and Plavix in 2 days.  Communicated with the patient's family and patient at bedside.  Stroke [facility]) Recent stroke.  Patient was recently discharged home with home health on 08/13/2022 on aspirin and Plavix and Lipitor.  Will resume aspirin Plavix in 2 days.   Benign essential HTN Patient is on HCTZ, Cozaar, atenolol at home.  Will discontinue HCTZ on discharge.   Chronic hyponatremia:  Sodium actually improved at 137.  Monitor BMP as outpatient.   Chronic obstructive pulmonary disease (HCC) Compensated.  Continue as needed albuterol.   Hyperlipidemia Continue Lipitor   Chronic diastolic CHF (congestive heart failure) ([place]):  2D echo on 08/12/2022 showed EF of 55 to 60% with grade 1 diastolic  dysfunction.  Remained compensated.  GERD (gastroesophageal reflux disease) Received IV Protonix during hospitalization.   On omeprazole at home.  Will change to Protonix on discharge.   Generalized weakness, deconditioning.    Patient has currently been living with her daughter after her recent hospitalization for stroke.  Physical therapy has seen the patient and recommend home health on discharge.   Burning micturition.  Urinalysis without any signs of infection.  Disposition.  At this time, patient is stable for disposition home with home health.  Patient will follow-up with PCP and GI as outpatient.  Medical Consultants:   GI  Procedures:    Endoscopy and colonoscopy on 08/20/2022 Subjective:   Today, patient was seen and examined at bedside.  Denies any dizziness lightheadedness shortness of breath nausea vomiting or abdominal pain.  Discharge Exam:   Vitals:   08/21/22 0510 08/21/22 0825  BP: 117/67 120/67  Pulse: 86 90  Resp: 18 16  Temp: 99 F (37.2 C) 99.2 F (37.3 C)  SpO2: 100% 95%   Vitals:   08/21/22 0024 08/21/22 0028 08/21/22 0510 08/21/22 0825  BP: (!) 89/42 (!) 124/42 117/67 120/67  Pulse: 78 62 86 90  Resp: '16  18 16  '$ Temp: 98.7 F (37.1 C)  99 F (37.2 C) 99.2 F (37.3 C)  TempSrc: Oral  Oral   SpO2: 97% 96% 100% 95%  Weight:   63.2 kg   Height:        General: Alert awake, not in obvious distress, elderly female, HENT: pupils equally reacting to light, pallor noted.  Oral mucosa is moist.  Chest:  Clear breath sounds.  Diminished breath sounds bilaterally. No crackles or wheezes.  CVS: S1 &S2 heard. No murmur.  Regular rate and rhythm. Abdomen: Soft, nontender, nondistended.  Bowel sounds are heard.   Extremities: No cyanosis, clubbing or edema.  Peripheral pulses are palpable. Psych: Alert, awake and oriented, normal mood CNS:  No cranial nerve deficits.  Power equal in all extremities.   Skin: Warm and dry.  No rashes noted.  The results of significant diagnostics from this hospitalization (including imaging, microbiology, ancillary and laboratory) are  listed below for reference.     Diagnostic Studies:   NM GI Blood Loss  Result Date: 08/17/2022 CLINICAL DATA:  Gastrointestinal bleeding. EXAM: NUCLEAR MEDICINE GASTROINTESTINAL BLEEDING SCAN TECHNIQUE: Sequential abdominal images were obtained following intravenous administration of Tc-47mlabeled red blood cells. RADIOPHARMACEUTICALS:  20.2 mCi Tc-976mertechnetate in-vitro labeled red cells. COMPARISON:  None Available. FINDINGS: 2 hour dynamic planar imaging of the abdomen pelvis demonstrates no accumulation of tagged red blood cells within the gastrointestinal tract. Physiologic activity is noted within the blood pool and solid organs. IMPRESSION: No evidence of active gastrointestinal bleeding. Electronically Signed   By: StSuzy Bouchard.D.   On: 08/17/2022 14:35     Labs:   Basic Metabolic Panel: Recent Labs  Lab 08/17/22 0410 08/18/22 0607 08/19/22 0525 08/20/22 0248 08/21/22 0517  NA 136 133* 134* 136 137  K 4.1 3.6 3.5 3.5 3.3*  CL 105 103 106 105 105  CO2 '25 26 25 23 26  '$ GLUCOSE 99 93 104* 112* 110*  BUN '12 11 10 8 '$ 5*  CREATININE 0.45 0.45 0.43* 0.38* 0.55  CALCIUM 8.1* 8.0* 8.2* 8.3* 8.4*  MG  --  1.8  --   --   --    GFR Estimated Creatinine Clearance: 40.4 mL/min (by C-G formula based on SCr of 0.55 mg/dL). Liver Function Tests: Recent Labs  Lab 08/16/22 1430  AST 26  ALT 15  ALKPHOS 72  BILITOT 0.4  PROT 7.2  ALBUMIN 4.0   No results for input(s): "LIPASE", "AMYLASE" in the last 168 hours. No results for input(s): "AMMONIA" in the last 168 hours. Coagulation profile Recent Labs  Lab 08/16/22 1430  INR 1.1    CBC: Recent Labs  Lab 08/17/22 1657 08/18/22 0607 08/18/22 1325 08/19/22 0525 08/19/22 1931 08/20/22 0248 08/20/22 1853 08/21/22 0517  WBC 4.9 3.9* 5.8 4.1  --  4.9  --   --   HGB 9.8* 9.1* 9.7* 8.5* 9.2* 8.3* 8.2* 8.4*  HCT 28.7* 26.3* 28.7* 25.0* 26.8* 24.3* 24.2* 24.7*  MCV 95.0 92.6 93.5 92.6  --  93.5  --   --   PLT 173  165 200 168  --  176  --   --    Cardiac Enzymes: No results for input(s): "CKTOTAL", "CKMB", "CKMBINDEX", "TROPONINI" in the last 168 hours. BNP: Invalid input(s): "POCBNP" CBG: Recent Labs  Lab 08/18/22 0742 08/18/22 1201 08/19/22 0920 08/20/22 0820 08/21/22 0822  GLUCAP 96 88 112* 112* 108*   D-Dimer No results for input(s): "DDIMER" in the last 72 hours. Hgb A1c No results for input(s): "HGBA1C" in the last 72 hours. Lipid Profile No results for input(s): "CHOL", "HDL", "LDLCALC", "TRIG", "CHOLHDL", "LDLDIRECT" in the last 72 hours. Thyroid function studies No results for input(s): "TSH", "T4TOTAL", "T3FREE", "THYROIDAB" in the last 72 hours.  Invalid input(s): "FREET3" Anemia work up No results for input(s): "VITAMINB12", "FOLATE", "FERRITIN", "TIBC", "IRON", "RETICCTPCT" in the last 72 hours. Microbiology No results found for this or any previous visit (from the past 240 hour(s)).   Discharge Instructions:   Discharge Instructions     Diet - low sodium heart healthy   Complete by: As directed    Discharge instructions   Complete by: As directed    Follow-up with your primary care provider in 1 week.  Check blood work at that time.Follow-up with GI Dr Allen Norris as outpatient as scheduled by the clinic/discuss pathology report..  Seek medical attention for worsening symptoms/ongoing bleeding.  Start taking aspirin and Plavix from 08/23/2022   Increase activity slowly   Complete by: As directed    No wound care   Complete by: As directed       Allergies as of 08/21/2022       Reactions   Cetirizine Other (See Comments)   tachycardia   Ciprofloxacin    Codeine    Flagyl [metronidazole]    Iodinated Contrast Media    Moxifloxacin Hcl In Nacl Other (See Comments)   hallucination   Penicillins    Has patient had a PCN reaction causing immediate rash, facial/tongue/throat swelling, SOB or lightheadedness with hypotension: yes Has patient had a PCN reaction  causing severe rash involving mucus membranes or skin necrosis: yes Has patient had a PCN reaction that required hospitalization no Has patient had a PCN reaction occurring within the last 10 years: no If all of the above answers are "NO", then may proceed with Cephalosporin use.   Shellfish Allergy    Statins Other (See Comments)   myalgias, unspecified   Sulfa Antibiotics         Medication List     STOP taking these medications    fluticasone 50 MCG/ACT nasal spray Commonly known as: FLONASE   hydrochlorothiazide 12.5 MG tablet Commonly known as: HYDRODIURIL   mupirocin ointment 2 % Commonly known as: BACTROBAN   omeprazole 20 MG  capsule Commonly known as: PRILOSEC       TAKE these medications    aspirin 81 MG chewable tablet Chew 1 tablet (81 mg total) by mouth daily. Start taking on: August 23, 2022 What changed:  how much to take These instructions start on August 23, 2022. If you are unsure what to do until then, ask your doctor or other care provider.   atenolol 25 MG tablet Commonly known as: TENORMIN Take 1 tablet by mouth daily.   atorvastatin 10 MG tablet Commonly known as: Lipitor Take 1 tablet (10 mg total) by mouth daily.   clopidogrel 75 MG tablet Commonly known as: Plavix Take 1 tablet (75 mg total) by mouth daily for 21 days. Start taking on: August 23, 2022 What changed: These instructions start on August 23, 2022. If you are unsure what to do until then, ask your doctor or other care provider.   cyanocobalamin 1000 MCG tablet Commonly known as: VITAMIN B12 Take 1,000 mcg by mouth daily.   diazepam 5 MG tablet Commonly known as: VALIUM Take 0.5 mg by mouth daily.   docusate calcium 240 MG capsule Commonly known as: SURFAK Take by mouth daily as needed.   ferrous sulfate 325 (65 FE) MG tablet Take 325 mg by mouth daily with breakfast.   GNP Probiotic Colon Support Caps Take by mouth.   latanoprost 0.005 % ophthalmic  solution Commonly known as: XALATAN Place 1 drop into both eyes at bedtime.   loratadine 10 MG tablet Commonly known as: CLARITIN Take 10 mg by mouth daily.   losartan 50 MG tablet Commonly known as: COZAAR Take 50 mg by mouth daily.   pantoprazole 40 MG tablet Commonly known as: Protonix Take 1 tablet (40 mg total) by mouth daily.   potassium chloride SA 20 MEQ tablet Commonly known as: KLOR-CON M Take 1 tablet (20 mEq total) by mouth daily for 5 days.   PRESERVISION AREDS PO Take 1 tablet by mouth daily. What changed: Another medication with the same name was removed. Continue taking this medication, and follow the directions you see here.        Follow-up Information     Derinda Late, MD Follow up in 1 week(s).   Specialty: Family Medicine Why: check blood work Contact information: [place] [facility] [place] [address] [phone]         Lucilla Lame, MD Follow up.   Specialty: Gastroenterology Why: as per office for GI bleed followup sp APC treatment Contact information: [place]  [place] [address] [phone]                  Time coordinating discharge: 39 minutes  Signed:  Blade Scheff  [facility] 08/21/2022, 10:03 AM

## 2022-08-27 ENCOUNTER — Inpatient Hospital Stay: Payer: Medicare Other

## 2022-08-27 ENCOUNTER — Inpatient Hospital Stay
Admission: EM | Admit: 2022-08-27 | Discharge: 2022-09-03 | DRG: 377 | Disposition: E | Payer: Medicare Other | Attending: Internal Medicine | Admitting: Internal Medicine

## 2022-08-27 ENCOUNTER — Emergency Department: Payer: Medicare Other

## 2022-08-27 DIAGNOSIS — Z91041 Radiographic dye allergy status: Secondary | ICD-10-CM | POA: Diagnosis not present

## 2022-08-27 DIAGNOSIS — I468 Cardiac arrest due to other underlying condition: Secondary | ICD-10-CM | POA: Diagnosis present

## 2022-08-27 DIAGNOSIS — Z885 Allergy status to narcotic agent status: Secondary | ICD-10-CM

## 2022-08-27 DIAGNOSIS — F17210 Nicotine dependence, cigarettes, uncomplicated: Secondary | ICD-10-CM | POA: Diagnosis present

## 2022-08-27 DIAGNOSIS — J449 Chronic obstructive pulmonary disease, unspecified: Secondary | ICD-10-CM | POA: Diagnosis present

## 2022-08-27 DIAGNOSIS — I2510 Atherosclerotic heart disease of native coronary artery without angina pectoris: Secondary | ICD-10-CM | POA: Diagnosis present

## 2022-08-27 DIAGNOSIS — Z880 Allergy status to penicillin: Secondary | ICD-10-CM | POA: Diagnosis not present

## 2022-08-27 DIAGNOSIS — Z888 Allergy status to other drugs, medicaments and biological substances status: Secondary | ICD-10-CM

## 2022-08-27 DIAGNOSIS — Z881 Allergy status to other antibiotic agents status: Secondary | ICD-10-CM | POA: Diagnosis not present

## 2022-08-27 DIAGNOSIS — K922 Gastrointestinal hemorrhage, unspecified: Secondary | ICD-10-CM | POA: Diagnosis present

## 2022-08-27 DIAGNOSIS — Z634 Disappearance and death of family member: Secondary | ICD-10-CM

## 2022-08-27 DIAGNOSIS — I10 Essential (primary) hypertension: Secondary | ICD-10-CM | POA: Diagnosis present

## 2022-08-27 DIAGNOSIS — Z85828 Personal history of other malignant neoplasm of skin: Secondary | ICD-10-CM

## 2022-08-27 DIAGNOSIS — N2889 Other specified disorders of kidney and ureter: Secondary | ICD-10-CM | POA: Diagnosis present

## 2022-08-27 DIAGNOSIS — K219 Gastro-esophageal reflux disease without esophagitis: Secondary | ICD-10-CM | POA: Diagnosis present

## 2022-08-27 DIAGNOSIS — I214 Non-ST elevation (NSTEMI) myocardial infarction: Secondary | ICD-10-CM | POA: Diagnosis present

## 2022-08-27 DIAGNOSIS — R54 Age-related physical debility: Secondary | ICD-10-CM | POA: Diagnosis present

## 2022-08-27 DIAGNOSIS — Z79899 Other long term (current) drug therapy: Secondary | ICD-10-CM

## 2022-08-27 DIAGNOSIS — H3530 Unspecified macular degeneration: Secondary | ICD-10-CM | POA: Diagnosis present

## 2022-08-27 DIAGNOSIS — F17200 Nicotine dependence, unspecified, uncomplicated: Secondary | ICD-10-CM | POA: Diagnosis present

## 2022-08-27 DIAGNOSIS — Z882 Allergy status to sulfonamides status: Secondary | ICD-10-CM | POA: Diagnosis not present

## 2022-08-27 DIAGNOSIS — K921 Melena: Principal | ICD-10-CM | POA: Diagnosis present

## 2022-08-27 DIAGNOSIS — Z515 Encounter for palliative care: Secondary | ICD-10-CM

## 2022-08-27 DIAGNOSIS — R000 Tachycardia, unspecified: Secondary | ICD-10-CM | POA: Diagnosis present

## 2022-08-27 DIAGNOSIS — Z7982 Long term (current) use of aspirin: Secondary | ICD-10-CM

## 2022-08-27 DIAGNOSIS — E785 Hyperlipidemia, unspecified: Secondary | ICD-10-CM | POA: Diagnosis present

## 2022-08-27 DIAGNOSIS — Z66 Do not resuscitate: Secondary | ICD-10-CM | POA: Diagnosis present

## 2022-08-27 DIAGNOSIS — Z8249 Family history of ischemic heart disease and other diseases of the circulatory system: Secondary | ICD-10-CM

## 2022-08-27 DIAGNOSIS — Z83511 Family history of glaucoma: Secondary | ICD-10-CM

## 2022-08-27 DIAGNOSIS — Z8673 Personal history of transient ischemic attack (TIA), and cerebral infarction without residual deficits: Secondary | ICD-10-CM | POA: Diagnosis not present

## 2022-08-27 DIAGNOSIS — I469 Cardiac arrest, cause unspecified: Secondary | ICD-10-CM

## 2022-08-27 DIAGNOSIS — Z7902 Long term (current) use of antithrombotics/antiplatelets: Secondary | ICD-10-CM

## 2022-08-27 DIAGNOSIS — Z9049 Acquired absence of other specified parts of digestive tract: Secondary | ICD-10-CM

## 2022-08-27 LAB — PREPARE RBC (CROSSMATCH)

## 2022-08-27 LAB — BASIC METABOLIC PANEL
Anion gap: 9 (ref 5–15)
BUN: 17 mg/dL (ref 8–23)
CO2: 23 mmol/L (ref 22–32)
Calcium: 8.1 mg/dL — ABNORMAL LOW (ref 8.9–10.3)
Chloride: 101 mmol/L (ref 98–111)
Creatinine, Ser: 0.71 mg/dL (ref 0.44–1.00)
GFR, Estimated: >60 mL/min (ref >60)
Glucose, Bld: 157 mg/dL — ABNORMAL HIGH (ref 70–99)
Potassium: 4 mmol/L (ref 3.5–5.1)
Sodium: 133 mmol/L — ABNORMAL LOW (ref 135–145)

## 2022-08-27 LAB — HEMOGLOBIN AND HEMATOCRIT, BLOOD
HCT: 29.6 % — ABNORMAL LOW (ref 36.0–46.0)
Hemoglobin: 9.6 g/dL — ABNORMAL LOW (ref 12.0–15.0)

## 2022-08-27 LAB — PROCALCITONIN: Procalcitonin: <0.1 ng/mL

## 2022-08-27 LAB — CBC WITH DIFFERENTIAL/PLATELET
Abs Immature Granulocytes: 0.04 10*3/uL (ref 0.00–0.07)
Basophils Absolute: 0 10*3/uL (ref 0.0–0.1)
Basophils Relative: 1 %
Eosinophils Absolute: 0.1 10*3/uL (ref 0.0–0.5)
Eosinophils Relative: 1 %
HCT: 26.5 % — ABNORMAL LOW (ref 36.0–46.0)
Hemoglobin: 8.3 g/dL — ABNORMAL LOW (ref 12.0–15.0)
Immature Granulocytes: 1 %
Lymphocytes Relative: 15 %
Lymphs Abs: 1 10*3/uL (ref 0.7–4.0)
MCH: 32.2 pg (ref 26.0–34.0)
MCHC: 31.3 g/dL (ref 30.0–36.0)
MCV: 102.7 fL — ABNORMAL HIGH (ref 80.0–100.0)
Monocytes Absolute: 0.4 10*3/uL (ref 0.1–1.0)
Monocytes Relative: 7 %
Neutro Abs: 4.9 10*3/uL (ref 1.7–7.7)
Neutrophils Relative %: 75 %
Platelets: 335 10*3/uL (ref 150–400)
RBC: 2.58 MIL/uL — ABNORMAL LOW (ref 3.87–5.11)
RDW: 13.7 % (ref 11.5–15.5)
WBC: 6.4 10*3/uL (ref 4.0–10.5)
nRBC: 0 % (ref 0.0–0.2)

## 2022-08-27 LAB — LACTIC ACID, PLASMA: Lactic Acid, Venous: 2 mmol/L (ref 0.5–1.9)

## 2022-08-27 LAB — CBG MONITORING, ED: Glucose-Capillary: 157 mg/dL — ABNORMAL HIGH (ref 70–99)

## 2022-08-27 LAB — HEPATIC FUNCTION PANEL
ALT: 13 U/L (ref 0–44)
AST: 20 U/L (ref 15–41)
Albumin: 3.4 g/dL — ABNORMAL LOW (ref 3.5–5.0)
Alkaline Phosphatase: 53 U/L (ref 38–126)
Bilirubin, Direct: <0.1 mg/dL (ref 0.0–0.2)
Total Bilirubin: 0.5 mg/dL (ref 0.3–1.2)
Total Protein: 6.2 g/dL — ABNORMAL LOW (ref 6.5–8.1)

## 2022-08-27 LAB — PHOSPHORUS: Phosphorus: 4.5 mg/dL (ref 2.5–4.6)

## 2022-08-27 LAB — TROPONIN I (HIGH SENSITIVITY)
Troponin I (High Sensitivity): 6 ng/L (ref <18)
Troponin I (High Sensitivity): 47 ng/L — ABNORMAL HIGH (ref <18)

## 2022-08-27 LAB — MAGNESIUM: Magnesium: 1.9 mg/dL (ref 1.7–2.4)

## 2022-08-27 MED ORDER — GLYCOPYRROLATE 0.2 MG/ML IJ SOLN
0.2000 mg | INTRAMUSCULAR | Status: DC | PRN
  Administered 2022-08-28: 0.2 mg via INTRAVENOUS
  Filled 2022-08-27 (×2): qty 1

## 2022-08-27 MED ORDER — HALOPERIDOL LACTATE 2 MG/ML PO CONC: 0.5000 mg | ORAL | Status: DC | PRN

## 2022-08-27 MED ORDER — MORPHINE BOLUS VIA INFUSION: 2.0000 mg | INTRAVENOUS | Status: DC | PRN

## 2022-08-27 MED ORDER — HALOPERIDOL 0.5 MG PO TABS: 0.5000 mg | ORAL_TABLET | ORAL | Status: DC | PRN

## 2022-08-27 MED ORDER — LORAZEPAM 2 MG/ML PO CONC
1.0000 mg | ORAL | Status: DC | PRN
  Filled 2022-08-27: qty 0.5

## 2022-08-27 MED ORDER — PANTOPRAZOLE INFUSION (NEW) - SIMPLE MED
8.0000 mg/h | INTRAVENOUS | Status: DC
  Administered 2022-08-27: 8 mg/h via INTRAVENOUS
  Filled 2022-08-27: qty 100

## 2022-08-27 MED ORDER — ATORVASTATIN CALCIUM 20 MG PO TABS: 10.0000 mg | ORAL_TABLET | Freq: Every day | ORAL | Status: DC

## 2022-08-27 MED ORDER — SODIUM CHLORIDE 0.9 % IV BOLUS
1000.0000 mL | Freq: Once | INTRAVENOUS | Status: AC
  Administered 2022-08-27: 1000 mL via INTRAVENOUS

## 2022-08-27 MED ORDER — MORPHINE SULFATE (PF) 2 MG/ML IV SOLN
2.0000 mg | INTRAVENOUS | Status: DC | PRN
  Administered 2022-08-27: 2 mg via INTRAVENOUS
  Filled 2022-08-27: qty 1

## 2022-08-27 MED ORDER — ACETAMINOPHEN 325 MG RE SUPP: 650.0000 mg | Freq: Four times a day (QID) | RECTAL | Status: DC | PRN

## 2022-08-27 MED ORDER — SODIUM CHLORIDE 0.9 % IV SOLN
10.0000 mL/h | Freq: Once | INTRAVENOUS | Status: AC
  Administered 2022-08-27: 10 mL/h via INTRAVENOUS

## 2022-08-27 MED ORDER — ACETAMINOPHEN 325 MG PO TABS: 650.0000 mg | ORAL_TABLET | Freq: Four times a day (QID) | ORAL | Status: DC | PRN

## 2022-08-27 MED ORDER — ONDANSETRON HCL 4 MG/2ML IJ SOLN: 4.0000 mg | Freq: Four times a day (QID) | INTRAMUSCULAR | Status: DC | PRN

## 2022-08-27 MED ORDER — LORAZEPAM 1 MG PO TABS: 1.0000 mg | ORAL_TABLET | ORAL | Status: DC | PRN

## 2022-08-27 MED ORDER — HEPARIN (PORCINE) 25000 UT/250ML-% IV SOLN: 750.0000 [IU]/h | INTRAVENOUS | Status: DC

## 2022-08-27 MED ORDER — MORPHINE 100MG IN NS 100ML (1MG/ML) PREMIX INFUSION
1.0000 mg/h | INTRAVENOUS | Status: DC | PRN
  Administered 2022-08-27: 1 mg/h via INTRAVENOUS
  Filled 2022-08-27: qty 100

## 2022-08-27 MED ORDER — DIPHENHYDRAMINE HCL 25 MG PO CAPS: 50.0000 mg | ORAL_CAPSULE | Freq: Once | ORAL | Status: DC

## 2022-08-27 MED ORDER — EPINEPHRINE 1 MG/10ML IJ SOSY
0.1000 mg | PREFILLED_SYRINGE | Freq: Once | INTRAMUSCULAR | Status: AC
  Administered 2022-08-27: 0.1 mg via INTRAVENOUS

## 2022-08-27 MED ORDER — PANTOPRAZOLE 80MG IVPB - SIMPLE MED
80.0000 mg | Freq: Once | INTRAVENOUS | Status: AC
  Administered 2022-08-27: 80 mg via INTRAVENOUS
  Filled 2022-08-27: qty 100

## 2022-08-27 MED ORDER — SENNOSIDES-DOCUSATE SODIUM 8.6-50 MG PO TABS: 1.0000 | ORAL_TABLET | Freq: Every evening | ORAL | Status: DC | PRN

## 2022-08-27 MED ORDER — GLYCOPYRROLATE 0.2 MG/ML IJ SOLN: 0.2000 mg | INTRAMUSCULAR | Status: DC | PRN

## 2022-08-27 MED ORDER — ONDANSETRON HCL 4 MG PO TABS: 4.0000 mg | ORAL_TABLET | Freq: Four times a day (QID) | ORAL | Status: DC | PRN

## 2022-08-27 MED ORDER — HALOPERIDOL LACTATE 5 MG/ML IJ SOLN: 0.5000 mg | INTRAMUSCULAR | Status: DC | PRN

## 2022-08-27 MED ORDER — GLYCOPYRROLATE 1 MG PO TABS: 1.0000 mg | ORAL_TABLET | ORAL | Status: DC | PRN

## 2022-08-27 MED ORDER — FENTANYL CITRATE PF 50 MCG/ML IJ SOSY
25.0000 ug | PREFILLED_SYRINGE | Freq: Once | INTRAMUSCULAR | Status: AC
  Administered 2022-08-27: 25 ug via INTRAVENOUS
  Filled 2022-08-27: qty 1

## 2022-08-27 MED ORDER — MORPHINE 100MG IN NS 100ML (1MG/ML) PREMIX INFUSION
1.0000 mg/h | INTRAVENOUS | Status: DC
  Administered 2022-08-27 – 2022-08-28 (×2): 10 mg/h via INTRAVENOUS
  Administered 2022-08-28: 1 mg/h via INTRAVENOUS
  Filled 2022-08-27 (×2): qty 100

## 2022-08-27 MED ORDER — ONDANSETRON 4 MG PO TBDP: 4.0000 mg | ORAL_TABLET | Freq: Four times a day (QID) | ORAL | Status: DC | PRN

## 2022-08-27 MED ORDER — METHYLPREDNISOLONE SODIUM SUCC 40 MG IJ SOLR: 40.0000 mg | Freq: Once | INTRAMUSCULAR | Status: DC

## 2022-08-27 MED ORDER — LORAZEPAM 2 MG/ML IJ SOLN
1.0000 mg | INTRAMUSCULAR | Status: DC | PRN
  Administered 2022-08-27 – 2022-08-28 (×4): 1 mg via INTRAVENOUS
  Filled 2022-08-27 (×4): qty 1

## 2022-08-27 MED ORDER — POLYVINYL ALCOHOL 1.4 % OP SOLN: 1.0000 [drp] | Freq: Four times a day (QID) | OPHTHALMIC | Status: DC | PRN

## 2022-08-27 MED ORDER — PANTOPRAZOLE SODIUM 40 MG IV SOLR: 40.0000 mg | Freq: Two times a day (BID) | INTRAVENOUS | Status: DC

## 2022-08-27 MED ORDER — DIPHENHYDRAMINE HCL 50 MG/ML IJ SOLN: 50.0000 mg | Freq: Once | INTRAMUSCULAR | Status: DC

## 2022-08-27 MED ORDER — SODIUM CHLORIDE 0.9 % IV SOLN: 10.0000 mg/h | INTRAVENOUS | Status: DC

## 2022-08-27 MED ORDER — BIOTENE DRY MOUTH MT LIQD: 15.0000 mL | OROMUCOSAL | Status: DC | PRN

## 2022-08-27 NOTE — Assessment & Plan Note (Signed)
-   Continue Protonix gtt. - Gastroenterology, Dr. Vicente Males has been consulted via secure chat and will see the patient - Clear liquids; n.p.o. after midnight

## 2022-08-27 NOTE — Assessment & Plan Note (Signed)
-   Protonix bolus 80 mg per EDP - Will continue with Protonix gtt. - GI has been consulted, Dr. Vicente Males via secure chat and epic order - Clear liquids at this time; n.p.o. after midnight - Orders to nursing maintain 2 PIV (prefer large bore) placed - Admit to progressive cardiac, inpatient

## 2022-08-27 NOTE — Progress Notes (Signed)
   08/22/2022 2100  Clinical Encounter Type  Visited With Patient and family together  Visit Type Initial  Referral From Nurse  Consult/Referral To Chaplain   Chaplain responded to nurse consult. Chaplain provided compassionate presence and reflective listening as patient and daughter spoke about health challenges. Patient expressed "wanting it over" and has chest discomfort due to CPR. Daughter expressed shocked emotions as she didn't know her mother was near EOL. Chaplain provided support as daughter spoke about this reality. Chaplain services are available for follow up as needed.

## 2022-08-27 NOTE — ED Notes (Signed)
Pt had bowel movement. Pts stool was bright red and dark in color. Amount : large  Provider notified.  Pt cleaned and peri care preformed.

## 2022-08-27 NOTE — ED Triage Notes (Signed)
Patient presents with blood in stool and complaints of weakness; Currently taking Plavix (h/o CVA); Was here on 08/17/2022 and admitted for the same, colonoscopy showed multiple bleeding colonic angiodysplastic lesions and sites were clipped to stop bleeding

## 2022-08-27 NOTE — Assessment & Plan Note (Addendum)
Presumptive etiology secondary to upper GI bleed, treat above - Status post 2 rounds of chest compressions and 1 treatment of Epinephrine - Patient currently hemodynamically stable and her CODE STATUS has been determined to be DNR/DNI per patient and her family - Complete echo ordered - Cardiology, Dr. Humphrey Rolls has been consulted via secure chat and phone call - Heparin per pharmacy for ACS, NSTEMI ordered, discussed with nursing staff

## 2022-08-27 NOTE — ED Provider Notes (Signed)
[facility] Provider Note    Event Date/Time   First MD Initiated Contact with Patient 08/03/2022 1420     (approximate)   History   Rectal Bleeding (Patient presents with blood in stool and complaints of weakness; Currently taking Plavix (h/o CVA); Was here on 08/17/2022 and admitted for the same, colonoscopy showed multiple bleeding colonic angiodysplastic lesions and sites were clipped to stop bleeding)   HPI  Allison Hampton is a 88 y.o. female with past medical history of stroke, recent lower GI bleed with clipping on Plavix here with GI bleed and dyspnea.  Patient reportedly had a grossly bloody and melanotic bowel movement today.  She has been increasingly weak over the last 24 hours.  She was being checked into triage with EMS when she lost consciousness and lost her pulses.  CPR was initiated.  Send 2 rounds of CPR were performed with epinephrine and BVM ventilation.  Patient had return of spontaneous circulation.  She has now altered but breathing independently.   Physical Exam   Triage Vital Signs: ED Triage Vitals  Enc Vitals Group     BP 08/22/2022 1402 (!) 76/52     Pulse Rate 08/12/2022 1402 (!) 108     Resp 08/18/2022 1402 19     Temp 08/26/2022 1402 98.3 F (36.8 C)     Temp Source 09/02/2022 1402 Oral     SpO2 08/10/2022 1402 (!) 86 %     Weight 08/05/2022 1401 139 lb 5.3 oz (63.2 kg)     Height 08/23/2022 1401 '5\' 5"'$  (1.651 m)     Head Circumference --      Peak Flow --      Pain Score 08/23/2022 1400 0     Pain Loc --      Pain Edu? --      Excl. in [place]? --     Most recent vital signs: Vitals:   08/26/2022 1445 08/17/2022 1500  BP: 132/70 134/68  Pulse:  87  Resp: 11 13  Temp:  98.2 F (36.8 C)  SpO2: 100% 100%     General: Minimally responsive, BVM assisted ventilations CV:  Good peripheral perfusion.  Tachycardic, bounding pulse after ROSC. Resp:  Normal effort.  Transmitted upper airway sounds but overall normal aeration. Abd:  No  distention.  No overt distention. Other:  Pale.   ED Results / Procedures / Treatments   Labs (all labs ordered are listed, but only abnormal results are displayed) Labs Reviewed  CBC WITH DIFFERENTIAL/PLATELET - Abnormal; Notable for the following components:      Result Value   RBC 2.58 (*)    Hemoglobin 8.3 (*)    HCT 26.5 (*)    MCV 102.7 (*)    All other components within normal limits  BASIC METABOLIC PANEL - Abnormal; Notable for the following components:   Sodium 133 (*)    Glucose, Bld 157 (*)    Calcium 8.1 (*)    All other components within normal limits  HEPATIC FUNCTION PANEL - Abnormal; Notable for the following components:   Total Protein 6.2 (*)    Albumin 3.4 (*)    All other components within normal limits  CBG MONITORING, ED - Abnormal; Notable for the following components:   Glucose-Capillary 157 (*)    All other components within normal limits  PROTIME-INR  LACTIC ACID, PLASMA  LACTIC ACID, PLASMA  TYPE AND SCREEN  PREPARE RBC (CROSSMATCH)  TROPONIN I (HIGH SENSITIVITY)  TROPONIN  I (HIGH SENSITIVITY)     EKG Sinus tachycardia, ventricular 130.  PR 140, QRS 92, QTc 436.  No acute ST elevation or depression.  No EKG evidence of acute ischemia or infarct.   RADIOLOGY Chest x-ray: Linear atelectasis in left lung base   I also independently reviewed and agree with radiologist interpretations.   PROCEDURES:  Critical Care performed: Yes, see critical care procedure note(s)  .Critical Care  Performed by: Duffy Bruce, MD Authorized by: Duffy Bruce, MD   Critical care provider statement:    Critical care time (minutes):  30   Critical care time was exclusive of:  Separately billable procedures and treating other patients   Critical care was necessary to treat or prevent imminent or life-threatening deterioration of the following conditions:  Cardiac failure, circulatory failure and respiratory failure   Critical care was time spent  personally by me on the following activities:  Development of treatment plan with patient or surrogate, discussions with consultants, evaluation of patient's response to treatment, examination of patient, ordering and review of laboratory studies, ordering and review of radiographic studies, ordering and performing treatments and interventions, pulse oximetry, re-evaluation of patient's condition and review of old charts     MEDICATIONS ORDERED IN ED: Medications  0.9 %  sodium chloride infusion (has no administration in time range)  pantoprazole (PROTONIX) 80 mg /NS 100 mL IVPB (80 mg Intravenous New Bag/Given 08/18/2022 1521)  sodium chloride 0.9 % bolus 1,000 mL (0 mLs Intravenous Stopped 08/12/2022 1448)  EPINEPHrine (ADRENALIN) 1 MG/10ML injection 0.1 mg (0.1 mg Intravenous Given 09/02/2022 1416)     IMPRESSION / MDM / [place] / ED COURSE  I reviewed the triage vital signs and the nursing notes.                              Differential diagnosis includes, but is not limited to, ongoing GI bleed with PEA arrest, arrhythmia, ischemia, upper GI bleed, AKI/electrolyte abnormality  Patient's presentation is most consistent with acute presentation with potential threat to life or bodily function.  The patient is on the cardiac monitor to evaluate for evidence of arrhythmia and/or significant heart rate changes.   88 year old female here with gross melena.  Patient just hospitalized for lower GI bleed.  On arrival, patient actually went into a witnessed PEA arrest.  Patient did not have DNR form on her at that time so brief CPR was performed with administration of epinephrine with return of spontaneous circulation.  Patient then had progressive return to near her baseline mental status.  Due to concern for active, severe GI bleed contributing to her arrest, 2 units of emergency release blood were ordered and transfused.  IV placed.  Family notified and at the bedside.  I discussed that  clinically I suspect ongoing GI bleed causing anemia and subsequent arrhythmia with arrest, and that while this is improved currently, given her age, this is an overall poor prognosis.  I discussed that she will likely have chest pain and rib fractures related to the CPR and they expressed understanding.  At this time, the current plan is to follow-up lab work, see if there is any bleeding that can be intervened on her controlled, and to not further resuscitate in the event of losing pulses again.  If the patient begins to have significant chest pain, plan will be for transitioning more to comfort care.  However, they would like to pursue medical  workup and treatment at this time as long as it is not invasive.    FINAL CLINICAL IMPRESSION(S) / ED DIAGNOSES   Final diagnoses:  Lower GI bleed  Cardiac arrest ([place])     Rx / DC Orders   ED Discharge Orders     None        Note:  This document was prepared using Dragon voice recognition software and may include unintentional dictation errors.   Duffy Bruce, MD 08/05/2022 [id number]

## 2022-08-27 NOTE — ED Notes (Signed)
Pt and pts family requesting palliative care at this time. DO aware.

## 2022-08-27 NOTE — H&P (Addendum)
History and Physical   Allison Hampton WGY:[id number] DOB: June 18, 1928 DOA: 08/16/2022  PCP: Derinda Late, MD  Outpatient Specialists: Dr. Allen Norris, GI Patient coming from: home  I have personally briefly reviewed patient's old medical records in [place].  Chief Concern: Weakness  HPI: Ms. Allison Hampton is a 88 year old female with history of GI bleed secondary to angiodysplastic lesion, hypertension, hyperlipidemia, COPD, tobacco use, who presents to the emergency department with chief concerns of weakness and melena stool.  Initial vitals in the emergency department showed temperature of 98.2, respiration rate of 16, heart rate of 87, blood pressure 134/68, SpO2 of 100% on 15 L nonrebreather.  Serum sodium is 133, potassium 4.0, chloride 101, bicarb 23, BUN of 17, serum creatinine 0.71, EGFR greater than 60, nonfasting glucose 157, WBC 6.4, hemoglobin 8.3, platelet 335.  High since troponin was 6 and on repeat is 47.  Lactic acid was elevated at 2.0.  Of note in triage while patient was getting her peripheral IV, she was noted to become unresponsive without a pulse, PEA arrest.  Patient had 2 rounds of CPR with 1 dose of epinephrine.  ED treatment: Sodium chloride 1 L bolus, Protonix 80 mg IV one-time dose, fentanyl 25 mcg, epinephrine IV one-time dose. -------------------------------- At bedside patient was able to tell me her name, her age, she knows she is in the hospital and she knows she knows the current calendar year 2023.  She reports that over the last week she has been feeling generalized and low energy.  She has macular degeneration and therefore has not looked at her stool over the last week.  Her daughter looked at her stool today noted that it was black with dark clots.  This prompted her to be taken to the ED for further evaluation.  At bedside she looks frail and weak.  She denies shortness of breath.  She has right-sided abdominal pain.  She states that  her ribs and sternum is hurting and it is worse with my palpation.  Social history: She lives at home by herself.  She is a former tobacco user and at her peak she was smoking about half a pack per day.  She was smoking about 5 cigarettes/day for the last 2 years quit smoking 2 weeks ago.  She denies EtOH and recreational drug use.  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  Her husband who passed away with a Actuary.  ROS: Constitutional: no weight change, no fever ENT/Mouth: no sore throat, no rhinorrhea Eyes: no eye pain, no vision changes Cardiovascular: + chest pain, no dyspnea,  no edema, no palpitations Respiratory: no cough, no sputum, no wheezing Gastrointestinal: + nausea, no vomiting, no diarrhea, no constipation, + melena Genitourinary: no urinary incontinence, no dysuria, no hematuria Musculoskeletal: no arthralgias, no myalgias Skin: no skin lesions, no pruritus, Neuro: + weakness, no loss of consciousness, no syncope Psych: no anxiety, no depression, no decrease appetite Heme/Lymph: no bruising, no bleeding  ED Course: Discussed with emergency medicine provider, patient requiring hospitalization for chief concerns of upper GI bleed and PEA arrest.  Assessment/Plan  Principal Problem:   Upper GI bleed Active Problems:   PEA (Pulseless electrical activity) (HCC)   HLD (hyperlipidemia)   Melena   Tobacco use disorder   Assessment and Plan:  # PEA arrest  # Upper GI bleed # NSTEMI - Recheck hemoglobin showed elevated H&H of 9.6/29.6.  I discussed extensively with cardiology over the phone and via secure  chat that I am concerned that patient had an ACS event secondary to coronary artery disease in setting of tobacco use and hypertension and hyperlipidemia - I was planning to continue Protonix gtt. and heparin gtt., complete echo ordered - Upon updating family, nursing staff states that patient just had a large bowel movement of approximately 500 to  750 mL of tarry stool - Upon extensive discussion with patient's only daughter at bedside, and her daughter discussing with the patient, family has elected to pursue comfort measures only at this time - Comfort care order set utilized - No antibiotics, IV treatment including fluid, heparin, protonix per family's request - Family requesting to cancel cardiology gastroenterology specialist consultation - Patient and RN states that the IV pain medication she was given was not enough and she is hurting in her chest. - IV morphine drip ordered, discussed with RN - As needed medications include pain control with morphine as needed, would transition to a drip if needed but patient does not appear to be in pain at this time - RN can pronounce death  * Upper GI bleed - Protonix bolus 80 mg per EDP - Will continue with Protonix gtt. - GI has been consulted, Dr. Vicente Males via secure chat and epic order - Clear liquids at this time; n.p.o. after midnight - Orders to nursing maintain 2 PIV (prefer large bore) placed - Admit to progressive cardiac, inpatient  PEA (Pulseless electrical activity) ([place]) Presumptive etiology secondary to upper GI bleed, treat above - Status post 2 rounds of chest compressions and 1 treatment of Epinephrine - Patient currently hemodynamically stable and her CODE STATUS has been determined to be DNR/DNI per patient and her family - Complete echo ordered - Cardiology, Dr. Humphrey Rolls has been consulted via secure chat and phone call - Heparin per pharmacy for ACS, NSTEMI ordered, discussed with nursing staff  HLD (hyperlipidemia) - Resumed home atorvastatin 10 mg daily  Tobacco use disorder - Patient quit smoking 2 weeks ago  Melena - Continue Protonix gtt. - Gastroenterology, Dr. Vicente Males has been consulted via secure chat and will see the patient - Clear liquids; n.p.o. after midnight  # Right renal mass - new diagnosis, discussed with daughter at bedside  DVT prophylaxis: None  - comfort measures Code Status: DNR - confirmed with family Family Communication: Husband and son were present throughout evaluation Disposition Plan: Anticipate in-hospital death Consults called: Gastroenterology, cardiology, spiritual care, TOC Admission status: Admit - It is my clinical opinion that admission to INPATIENT is reasonable and necessary because of the expectation that this patient will require hospital care that crosses at least 2 midnights to treat this condition based on the medical complexity of the problems presented.  Given the aforementioned information, the predictability of an adverse outcome is felt to be significant.  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 George Ina and pt referred to [facility] for txt   Squamous cell carcinoma of skin 10/20/2018   L hand dorsum   Squamous cell carcinoma of skin 07/21/2018   R pretibial below knww   Squamous cell carcinoma of skin 12/29/2014   SCC IS L dorsum foot   Squamous cell carcinoma of skin 08/17/2014  L lat calf   Squamous cell carcinoma of skin 04/01/2014   L upper arm   Squamous cell carcinoma of skin 05/18/2009   L dorsal hand   Squamous cell carcinoma of skin 10/02/2021   L mid pretibia, [facility]   Squamous cell carcinoma of skin 06/11/2022   left foot dorsum, [facility]   Squamous cell carcinoma of skin 07/31/2022   L dorsum foot, recurrent, needs Mohs with Dr. Manley Mason   Uterine mass    Past Surgical History:  Procedure Laterality Date   adenoids     cataract surgery     CHOLECYSTECTOMY     COLONOSCOPY WITH PROPOFOL N/A 08/20/2022   Procedure: COLONOSCOPY WITH PROPOFOL;  Surgeon: Lucilla Lame, MD;  Location: [facility] ENDOSCOPY;  Service: Endoscopy;  Laterality: N/A;    ESOPHAGOGASTRODUODENOSCOPY (EGD) WITH PROPOFOL N/A 08/20/2022   Procedure: ESOPHAGOGASTRODUODENOSCOPY (EGD) WITH PROPOFOL;  Surgeon: Lucilla Lame, MD;  Location: [facility] ENDOSCOPY;  Service: Endoscopy;  Laterality: N/A;   TONSILLECTOMY     Social History:  reports that she has been smoking cigarettes. She has been smoking an average of .25 packs per day. She has never used smokeless tobacco. She reports that she does not drink alcohol and does not use drugs.  Allergies  Allergen Reactions   Cetirizine Other (See Comments)    tachycardia   Ciprofloxacin    Codeine    Flagyl [Metronidazole]    Iodinated Contrast Media    Moxifloxacin Hcl In Nacl Other (See Comments)    hallucination   Penicillins     Has patient had a PCN reaction causing immediate rash, facial/tongue/throat swelling, SOB or lightheadedness with hypotension: yes Has patient had a PCN reaction causing severe rash involving mucus membranes or skin necrosis: yes Has patient had a PCN reaction that required hospitalization no Has patient had a PCN reaction occurring within the last 10 years: no If all of the above answers are "NO", then may proceed with Cephalosporin use.    Shellfish Allergy    Statins Other (See Comments)    myalgias, unspecified   Sulfa Antibiotics    Family History  Problem Relation Age of Onset   Heart disease Mother    Glaucoma Mother    Aneurysm Mother    Cancer Neg Hx    Diabetes Neg Hx    Ovarian cancer Neg Hx    Family history: Family history reviewed and not pertinent  Prior to Admission medications   Medication Sig Start Date End Date Taking? Authorizing Provider  aspirin 81 MG chewable tablet Chew 1 tablet (81 mg total) by mouth daily. 08/23/22   Pokhrel, Corrie Mckusick, MD  atenolol (TENORMIN) 25 MG tablet Take 1 tablet by mouth daily. 05/08/22   [provider]  atorvastatin (LIPITOR) 10 MG tablet Take 1 tablet (10 mg total) by mouth daily. 08/13/22 08/13/23  Vashti Hey,  MD  clopidogrel (PLAVIX) 75 MG tablet Take 1 tablet (75 mg total) by mouth daily for 21 days. 08/23/22 09/13/22  Pokhrel, Corrie Mckusick, MD  diazepam (VALIUM) 5 MG tablet Take 0.5 mg by mouth daily.  08/10/15   [provider]  docusate calcium (SURFAK) 240 MG capsule Take by mouth daily as needed.     [provider]  ferrous sulfate 325 (65 FE) MG tablet Take 325 mg by mouth daily with breakfast.    [provider]  latanoprost (XALATAN) 0.005 % ophthalmic solution Place 1 drop into both eyes at bedtime.    [provider]  loratadine (CLARITIN)  10 MG tablet Take 10 mg by mouth daily.    [provider]  losartan (COZAAR) 50 MG tablet Take 50 mg by mouth daily.    [provider]  Multiple Vitamins-Minerals (PRESERVISION AREDS PO) Take 1 tablet by mouth daily.    [provider]  pantoprazole (PROTONIX) 40 MG tablet Take 1 tablet (40 mg total) by mouth daily. 08/21/22 08/21/23  Pokhrel, Corrie Mckusick, MD  potassium chloride SA (KLOR-CON M) 20 MEQ tablet Take 1 tablet (20 mEq total) by mouth daily for 5 days. 08/21/22 08/26/22  Pokhrel, Corrie Mckusick, MD  Probiotic Product (GNP PROBIOTIC COLON SUPPORT) CAPS Take by mouth.    [provider]  vitamin B-12 (CYANOCOBALAMIN) 1000 MCG tablet Take 1,000 mcg by mouth daily.    [provider]   Physical Exam: Vitals:   08/12/2022 1445 08/30/2022 1500 08/26/2022 1745 08/24/2022 1812  BP: 132/70 134/68    Pulse:  87 95   Resp: _0 Temp:  98.2 F (36.8 C)    TempSrc:  Oral    SpO2: 100% 100% 100% 100%  Weight:      Height:       Constitutional: appears frail, age-appropriate, uncomfortable Eyes: PERRL, lids and conjunctivae normal ENMT: Mucous membranes are moist. Posterior pharynx clear of any exudate or lesions. Age-appropriate dentition. Hearing appropriate Neck: normal, supple, no masses, no thyromegaly Respiratory: clear to auscultation bilaterally, no wheezing, no crackles. Normal  respiratory effort. No accessory muscle use.  Cardiovascular: Regular rate and rhythm, no murmurs / rubs / gallops. No extremity edema. 2+ pedal pulses. No carotid bruits.  Abdomen: Right Upper quadrant tenderness, no masses palpated, no hepatosplenomegaly. Bowel sounds positive.  Musculoskeletal: no clubbing / cyanosis. No joint deformity upper and lower extremities. Good ROM, no contractures, no atrophy. Normal muscle tone. Chest/sternal tenderness Skin: no rashes, lesions, ulcers. No induration Neurologic: Sensation intact. Strength 5/5 in all 4.  Psychiatric: Normal judgment and insight. Alert and oriented x 3. Normal mood.   EKG: independently reviewed, showing sinus tachycardia with rate of 130, QTc 436  Chest x-ray on Admission: I personally reviewed and I agree with radiologist reading as below.  CT ABDOMEN PELVIS WO CONTRAST  Result Date: 08/22/2022 CLINICAL DATA:  Left lower quadrant abdominal pain EXAM: CT ABDOMEN AND PELVIS WITHOUT CONTRAST TECHNIQUE: Multidetector CT imaging of the abdomen and pelvis was performed following the standard protocol without IV contrast. RADIATION DOSE REDUCTION: This exam was performed according to the departmental dose-optimization program which includes automated exposure control, adjustment of the mA and/or kV according to patient size and/or use of iterative reconstruction technique. COMPARISON:  07/22/2016 FINDINGS: Lower chest: Dependent bibasilar atelectasis or consolidation. Thoracic aortic and coronary artery calcification. Hepatobiliary: No focal liver abnormality is seen. Status post cholecystectomy. No biliary dilatation. Pancreas: Unremarkable. No pancreatic ductal dilatation or surrounding inflammatory changes. Spleen: Normal in size without focal abnormality. Adrenals/Urinary Tract: Unremarkable adrenal glands. Rounded 2.4 cm indeterminate lesion emanating from the mid to lower pole of the right kidney with internal density of 54 HU. Kidneys  are otherwise unremarkable. Probable vascular calcifications in the bilateral hila. No obstructing stones. No hydronephrosis. Urinary bladder within normal limits. Stomach/Bowel: Stomach is within normal limits. Appendix appears normal (series 5, image 47). Extensive left-sided colonic diverticulosis. No evidence of bowel wall thickening, distention, or inflammatory changes. Vascular/Lymphatic: Extensive aortoiliac atherosclerosis. Fusiform infrarenal abdominal aortic aneurysm measuring 3.8 cm in diameter (series 2, image 27), previously measured 3.3 cm in 2017. No abdominopelvic lymphadenopathy. Reproductive:  Status post hysterectomy. No adnexal masses. Other: No ascites.  No pneumoperitoneum. Musculoskeletal: No acute or significant osseous findings. IMPRESSION: 1. No acute abdominopelvic findings. 2. Extensive left-sided colonic diverticulosis without evidence of acute diverticulitis. 3. Dependent bibasilar atelectasis or consolidation. 4. Indeterminate 2.4 cm lesion emanating from the mid to lower pole of the right kidney, possibly a hyperdense cyst although solid mass not excluded. Further evaluation with nonemergent renal ultrasound is recommended. 5. Fusiform infrarenal abdominal aortic aneurysm measuring 3.8 cm in diameter, previously measured 3.3 cm in 2017. Recommend follow-up ultrasound every 2 years. This recommendation follows ACR consensus guidelines: White Paper of the ACR Incidental Findings Committee II on Vascular Findings. J Am Coll Radiol 2013; 10:789-794. 6. Aortic and coronary artery atherosclerosis (ICD10-I70.0). Electronically Signed   By: Davina Poke D.O.   On: 08/18/2022 17:33   DG Chest 1 View  Result Date: 08/22/2022 CLINICAL DATA:  CPR EXAM: CHEST  1 VIEW COMPARISON:  08/11/2022 FINDINGS: Stable heart size. Aortic atherosclerosis. Mild linear atelectasis at the left lung base. No pleural effusion. Presumed skin fold at the periphery of the upper right hemithorax. No evidence of  a pneumothorax. No displaced rib fractures on frontal view. IMPRESSION: Mild linear atelectasis at the left lung base. Otherwise, no acute cardiopulmonary findings. Electronically Signed   By: Davina Poke D.O.   On: 08/05/2022 14:42    Labs on Admission: I have personally reviewed following labs CBC: Recent Labs  Lab 08/21/22 0517 08/13/2022 1421 08/30/2022 1632  WBC  --  6.4  --   NEUTROABS  --  4.9  --   HGB 8.4* 8.3* 9.6*  HCT 24.7* 26.5* 29.6*  MCV  --  102.7*  --   PLT  --  335  --    Basic Metabolic Panel: Recent Labs  Lab 08/21/22 0517 08/17/2022 1421 08/10/2022 1602  NA 137 133*  --   K 3.3* 4.0  --   CL 105 101  --   CO2 26 23  --   GLUCOSE 110* 157*  --   BUN 5* 17  --   CREATININE 0.55 0.71  --   CALCIUM 8.4* 8.1*  --   MG  --   --  1.9  PHOS  --   --  4.5   GFR: Estimated Creatinine Clearance: 40.4 mL/min (by C-G formula based on SCr of 0.71 mg/dL).  Liver Function Tests: Recent Labs  Lab 09/01/2022 1421  AST 20  ALT 13  ALKPHOS 53  BILITOT 0.5  PROT 6.2*  ALBUMIN 3.4*   CBG: Recent Labs  Lab 08/21/22 0822 08/10/2022 1414  GLUCAP 108* 157*   Urine analysis:    Component Value Date/Time   COLORURINE YELLOW (A) 08/18/2022 1300   APPEARANCEUR CLEAR (A) 08/18/2022 1300   APPEARANCEUR Turbid 12/05/2013 0224   LABSPEC 1.009 08/18/2022 1300   LABSPEC 1.006 12/05/2013 0224   PHURINE 5.0 08/18/2022 1300   GLUCOSEU NEGATIVE 08/18/2022 1300   GLUCOSEU Negative 12/05/2013 0224   HGBUR NEGATIVE 08/18/2022 1300   BILIRUBINUR NEGATIVE 08/18/2022 1300   BILIRUBINUR Negative 12/05/2013 0224   KETONESUR NEGATIVE 08/18/2022 1300   PROTEINUR NEGATIVE 08/18/2022 1300   NITRITE NEGATIVE 08/18/2022 1300   LEUKOCYTESUR NEGATIVE 08/18/2022 1300   LEUKOCYTESUR 3+ 12/05/2013 0224   CRITICAL CARE Performed by: Dr. Tobie Poet  Total critical care time: 60 minutes  Critical care time was exclusive of separately billable procedures and treating other  patients.  Critical care was necessary to treat or prevent imminent or  life-threatening deterioration.  Critical care was time spent personally by me on the following activities: development of treatment plan with patient and/or surrogate as well as nursing, discussions with consultants, evaluation of patient's response to treatment, examination of patient, obtaining history from patient or surrogate, ordering and performing treatments and interventions, ordering and review of laboratory studies, ordering and review of radiographic studies, pulse oximetry and re-evaluation of patient's condition.  This document was prepared using Dragon Voice Recognition software and may include unintentional dictation errors.  Dr. Tobie Poet [facility]  If 7PM-7AM, please contact overnight-coverage provider If 7AM-7PM, please contact day coverage provider [email or web address]  08/21/2022, 7:16 PM

## 2022-08-27 NOTE — Hospital Course (Signed)
Ms. Markelle Asaro is a 88 year old female with history of GI bleed secondary to angiodysplastic lesion, hypertension, hyperlipidemia, COPD, tobacco use, who presents to the emergency department with chief concerns of weakness and melena stool.  Initial vitals in the emergency department showed temperature of 98.2, respiration rate of 16, heart rate of 87, blood pressure 134/68, SpO2 of 100% on 15 L nonrebreather.  Serum sodium is 133, potassium 4.0, chloride 101, bicarb 23, BUN of 17, serum creatinine 0.71, EGFR greater than 60, nonfasting glucose 157, WBC 6.4, hemoglobin 8.3, platelet 335.  High since troponin was 6 and on repeat is 47.  Lactic acid was elevated at 2.0.  Of note in triage while patient was getting her peripheral IV, she was noted to become unresponsive without a pulse, PEA arrest.  Patient had 2 rounds of CPR with 1 dose of epinephrine.  ED treatment: Sodium chloride 1 L bolus, Protonix 80 mg IV one-time dose, fentanyl 25 mcg, epinephrine IV one-time dose.

## 2022-08-27 NOTE — ED Notes (Signed)
While this RN was inserting an IV to the right AC, patient went unresponsive, head down and drooling. 2 RN's and PA attempted to get a pulse, but was unable to. Pt moved from wheelchair to the stretcher and This RN called for help. CPR was started and pt taken to room. No DNR paperwork was with the patient on artrial from EMS.

## 2022-08-27 NOTE — ED Provider Notes (Addendum)
I received this patient in signout status post PEA arrest in the setting of lower GI bleeding.  She has melena.  She is now awake and alert, her CODE STATUS is DNR/DNI.    We considered CT angiogram to assess for the source of bleeding and potential for IR intervention however this patient has a contrast allergy anaphylactic allergy swelling of the throat and hives, and notes that she had a breakthrough anaphylactic reaction despite pretreatment in the past.  Given the risks of this serious life-threatening anaphylactic reaction with history of breakthrough despite treatment, will defer angiogram at this time given her hemodynamic stability.  Gust with family and they are in agreement at this time.  Admit to hospitalist service.    Lucillie Garfinkel, MD 09/01/2022 1545    Lucillie Garfinkel, MD 09/01/2022 [phone]

## 2022-08-27 NOTE — Assessment & Plan Note (Signed)
-   Patient quit smoking 2 weeks ago

## 2022-08-27 NOTE — ED Provider Triage Note (Signed)
Emergency Medicine Provider Triage Evaluation Note  Allison Hampton , a 88 y.o. female  was evaluated in triage.  Pt complains of generalized weakness. Recent diagnosis and admission for colitis. She had a colonoscopy and clips placed while here with complete resolution of bleeding until today. Per EMS, orthostatic and hypotensive but remains awake and alert.  Physical Exam  There were no vitals taken for this visit. Gen:   Awake, no distress   Resp:  Normal effort  MSK:   Moves extremities without difficulty  Other:    Medical Decision Making  Medically screening exam initiated at 1:58 PM.  Appropriate orders placed.  Allison Hampton was informed that the remainder of the evaluation will be completed by another provider, this initial triage assessment does not replace that evaluation, and the importance of remaining in the ED until their evaluation is complete.    Victorino Dike, FNP 08/10/2022 [phone]

## 2022-08-27 NOTE — Consult Note (Signed)
ANTICOAGULATION CONSULT NOTE - Initial Consult  Pharmacy Consult for heparin Indication: chest pain/ACS  Allergies  Allergen Reactions   Cetirizine Other (See Comments)    tachycardia   Ciprofloxacin    Codeine    Flagyl [Metronidazole]    Iodinated Contrast Media    Moxifloxacin Hcl In Nacl Other (See Comments)    hallucination   Penicillins     Has patient had a PCN reaction causing immediate rash, facial/tongue/throat swelling, SOB or lightheadedness with hypotension: yes Has patient had a PCN reaction causing severe rash involving mucus membranes or skin necrosis: yes Has patient had a PCN reaction that required hospitalization no Has patient had a PCN reaction occurring within the last 10 years: no If all of the above answers are "NO", then may proceed with Cephalosporin use.    Shellfish Allergy    Statins Other (See Comments)    myalgias, unspecified   Sulfa Antibiotics     Patient Measurements: Height: '5\' 5"'$  (165.1 cm) Weight: 63.2 kg (139 lb 5.3 oz) IBW/kg (Calculated) : 57 Heparin Dosing Weight: 63.2 kg  Vital Signs: Temp: 98.2 F (36.8 C) (12/25 1500) Temp Source: Oral (12/25 1500) BP: 134/68 (12/25 1500) Pulse Rate: 87 (12/25 1500)  Labs: Recent Labs    08/23/2022 1421 08/26/2022 1422 08/31/2022 1632  HGB 8.3*  --  9.6*  HCT 26.5*  --  29.6*  PLT 335  --   --   CREATININE 0.71  --   --   TROPONINIHS 6 47*  --     Estimated Creatinine Clearance: 40.4 mL/min (by C-G formula based on SCr of 0.71 mg/dL).   Medical History: Past Medical History:  Diagnosis Date   Abdominal mass    Actinic keratosis    Atrophic vaginitis    Colitis, ischemic (HCC)    GERD (gastroesophageal reflux disease)    Pelvic pain in female    Pyometra    Skin cancer ~2/21   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 George Ina and pt referred to [facility] for txt   Squamous cell carcinoma of skin 10/20/2018   L hand dorsum   Squamous cell carcinoma of skin 07/21/2018    R pretibial below knww   Squamous cell carcinoma of skin 12/29/2014   SCC IS L dorsum foot   Squamous cell carcinoma of skin 08/17/2014   L lat calf   Squamous cell carcinoma of skin 04/01/2014   L upper arm   Squamous cell carcinoma of skin 05/18/2009   L dorsal hand   Squamous cell carcinoma of skin 10/02/2021   L mid pretibia, [facility]   Squamous cell carcinoma of skin 06/11/2022   left foot dorsum, [facility]   Squamous cell carcinoma of skin 07/31/2022   L dorsum foot, recurrent, needs Mohs with Dr. Manley Mason   Uterine mass     Medications:  (Not in a hospital admission)  Scheduled:   atorvastatin  10 mg Oral Daily   [START ON 08/31/2022] pantoprazole  40 mg Intravenous Q12H   Infusions:   pantoprazole 8 mg/hr (08/17/2022 1640)   PRN: acetaminophen **OR** acetaminophen, morphine injection, ondansetron **OR** ondansetron (ZOFRAN) IV, senna-docusate Anti-infectives (From admission, onward)    None       Assessment: Pharmacy consulted to start heparin for ACS. Trop slightly elevated 6 > 47 from baseline. Status post PEA arrest in the setting of lower GI bleeding. She has melena. After discussion with MD, plan is to hold the heparin and have discussion with  family. Will put orders in. RN is aware not to start heparin.   Goal of Therapy:  Heparin level 0.3-0.7 units/ml Monitor platelets by anticoagulation protocol: Yes   Plan:  No bolus due to bleeding.  Start heparin infusion at 750 units/hr Check anti-Xa level in 8 hours and daily while on heparin Continue to monitor H&H and platelets  Oswald Hillock, PharmD, BCPS 08/10/2022,5:41 PM

## 2022-08-27 NOTE — Assessment & Plan Note (Signed)
-   Resumed home atorvastatin 10 mg daily

## 2022-08-28 DIAGNOSIS — K922 Gastrointestinal hemorrhage, unspecified: Secondary | ICD-10-CM | POA: Diagnosis not present

## 2022-08-28 LAB — TYPE AND SCREEN
ABO/RH(D): O POS
Antibody Screen: NEGATIVE
Unit division: 0
Unit division: 0

## 2022-08-28 LAB — BPAM RBC
Blood Product Expiration Date: 202401012359
Blood Product Expiration Date: 202401052359
ISSUE DATE / TIME: 202312251420
ISSUE DATE / TIME: 202312251420
Unit Type and Rh: 5100
Unit Type and Rh: 5100

## 2022-09-03 NOTE — ED Notes (Signed)
Funeral home notified of patient's death

## 2022-09-03 NOTE — Discharge Summary (Signed)
  DEATH SUMMARY   Patient Details  Name: Allison Hampton MRN: [id number] DOB: 03-28-1928  Admission/Discharge Information   Admit Date:  29-Aug-2022  Date of Death: Date of Death: 2022/08/30  Time of Death: Time of Death: 1920  Length of Stay: 1  Code Status:   Referring Physician: Derinda Late, MD    Reason(s) for Hospitalization  89 year old female with prior history of GI bleeding due to angiodysplastic lesions, hypertension dyslipidemia, COPD who presented to the emergency department with weakness and melanotic stool.  Initial labs were not significantly abnormal or different from her baseline.  Unfortunately while in triage patient became unresponsive without a pulse and was found to be in PEA arrest.  She underwent 2 rounds of CPR and received a dose of epinephrine.  At time of admission she was able to tell the admitting physician her name her age and that she knew she was in the hospital and she knew it was the year of 2023.   In addition to the above patient was found to have symptoms of non-STEMI and there were significant concerns that patient would develop a profound ACS event due to underlying coronary disease in the context of evolving GI bleed.  While hitting physician was updating the family it was noted patient had a large 500 to 750 cc of tarry stool.  An extensive discussion was held by the admitting physician with the patient's daughter and the decision was made to pursue comfort measures.  Diagnoses  Preliminary cause of death:  Secondary Diagnoses (including complications and co-morbidities):  Principal Problem:   Upper GI bleed Active Problems:   Melena   Tobacco use disorder   HLD (hyperlipidemia)   PEA (Pulseless electrical activity) [facility])    Brief Hospital Course  Allison Hampton is a 89 y.o. year old female who presented with GI bleed symptoms as described above.  While on she developed PEA cardiac arrest and was eventually resuscitated.   Postarrest she had symptoms consistent with non-STEMI.  She continued to have GI bleeding symptoms with moderate to large volume lower GI bleeding continuing.  Decision made by family to transition to comfort measures.  She was started on a morphine infusion to assist with comfort.  Allison Hampton passed away at [place] with her daughter at her bedside.  Chaplain came to the bedside to offer emotional support.     Erin Hearing 08/30/2022, 7:20 AM

## 2022-09-03 NOTE — ED Notes (Addendum)
Patient's monitor alarmed asystole, so I went and examined patient. She was not breathing, but had a very weak femoral pulse. I stayed with patient and rechecked her pulse at 1920, and could not feel it. Had second RN examine patient and no respiratory or cardiac activity was found by her at this time. Notified Neomia Glass, NP. Last cardiac activity was noted at 1920.

## 2022-09-03 NOTE — Progress Notes (Signed)
       CROSS COVER NOTE  NAME: Allison Hampton MRN: [id number] DOB : 30-Aug-1928 ATTENDING PHYSICIAN: Vernelle Emerald, MD    Notified by RN that [name] has expired at [place]  She was DNR and comfort care.  2 RN verified.  Family was present at bedside and available to RN.    To reach the provider On-Call:   7AM- 7PM see care teams to locate the attending and reach out to them via [email or web address]. 7PM-7AM contact night-coverage If you still have difficulty reaching the appropriate provider, please page the [facility] (Director on Call) for [facility] on amion for assistance  This document was prepared using Set designer software and may include unintentional dictation errors.  Neomia Glass DNP, MBA, FNP-BC Nurse Practitioner [facility] Pager [phone]

## 2022-09-03 NOTE — Progress Notes (Signed)
Chaplain responded to page to support family due to death. Family were emotional but bereavement risk level is low. Family expressed understanding that this was coming but since she's an only child she was sad to see her pass. Family is at peace and appeared calm upon departure.

## 2022-09-03 NOTE — TOC Initial Note (Signed)
Transition of Care [facility]) - Initial/Assessment Note    Patient Details  Name: Genowefa Morga MRN: [id number] Date of Birth: Jan 21, 1928  [facility]) CM/SW Contact:    Candie Chroman, LCSW Phone Number: 2022-09-10, 4:02 PM  Clinical Narrative:  CSW acknowledges consult for comfort care/end of life measures. CSW met with daughter and her boyfriend. Daughter feels like patient may not be stable for transfer to the local hospice home at this time and attending said that is probably accurate. CSW has asked [facility] to follow along in case things change and she meets criteria for the hospice home. Daughter is very familiar with this facility. No further concerns. CSW encouraged daughter to contact CSW as needed. CSW will continue to follow patient and her daughter for support.                Expected Discharge Plan: Expired Barriers to Discharge:  (Anticipated hospital death)   Patient Goals and CMS Choice Patient states their goals for this hospitalization and ongoing recovery are:: Patient not oriented.          Expected Discharge Plan and Services     Post Acute Care Choice:  [facility] death vs hospice home) Living arrangements for the past 2 months: [place]                                      Prior Living Arrangements/Services Living arrangements for the past 2 months: [place] Lives with:: Self Patient language and need for interpreter reviewed:: Yes Do you feel safe going back to the place where you live?: No   Comfort Care  Need for Family Participation in Patient Care: Yes (Comment) Care giver support system in place?: Yes (comment) Current home services: Home PT Criminal Activity/Legal Involvement Pertinent to Current Situation/Hospitalization: No - Comment as needed  Activities of Daily Living      Permission Sought/Granted                  Emotional Assessment    Attitude/Demeanor/Rapport: Unable to Assess Affect (typically observed): Unable to Assess   Alcohol / Substance Use: Not Applicable Psych Involvement: No (comment)  Admission diagnosis:  Upper GI bleed [K92.2] Patient Active Problem List   Diagnosis Date Noted   Upper GI bleed 08/09/2022   PEA (Pulseless electrical activity) ([place]) 08/16/2022   AVM (arteriovenous malformation) of stomach, acquired 08/20/2022   AVM (arteriovenous malformation) of colon 08/20/2022   Rectal bleeding 08/17/2022   GI bleeding 08/16/2022   HLD (hyperlipidemia) 08/16/2022   Anxiety 08/16/2022   Iron deficiency anemia 08/16/2022   Chronic diastolic CHF (congestive heart failure) ([place]) 08/16/2022   Acute blood loss anemia 08/16/2022   Acute CVA (cerebrovascular accident) ([place]) 08/12/2022   Stroke ([place]) 08/11/2022   Chronic hyponatremia 08/11/2022   Abnormal echocardiogram findings without diagnosis 08/11/2022   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[place]) 08/11/2022   Tobacco use disorder 08/31/2016   Ischemic colitis ([place]) 08/31/2016   Melena 07/17/2016   Acute GI bleeding    GERD (gastroesophageal reflux disease) 08/17/2015   History of vaginal discharge 08/17/2015   Menopause 08/17/2015   Urethral caruncle 08/17/2015   Vaginal atrophy 08/17/2015   Chronic anxiety 08/17/2015   Chronic obstructive pulmonary disease ([place]) 08/17/2015   Benign essential HTN 08/06/2014   Absolute anemia 02/09/2014   PCP:  Derinda Late, MD Pharmacy:   [facility] -  [place], [place] - [address] [place] [address] Phone: [phone] Fax: [phone]     Social Determinants of Health (SDOH) Social History: SDOH Screenings   Food Insecurity: No Food Insecurity (08/17/2022)  Housing: Low Risk  (08/17/2022)  Transportation Needs: No Transportation Needs (08/17/2022)  Utilities: Not At Risk (08/17/2022)  Tobacco Use: High Risk (08/21/2022)   SDOH Interventions:     Readmission Risk  Interventions     No data to display

## 2022-09-03 NOTE — ED Notes (Signed)
Allison Hampton with [employer] released the patient to the funeral home. Patient is not suitable for donation.

## 2022-09-03 NOTE — ED Notes (Signed)
Patient's belongings sent home with daughter. She is requesting that a silver colored bracelet that is on the patient's right wrist remains on the patient. Will notify funeral home.

## 2022-09-03 NOTE — Progress Notes (Signed)
[facility] PROGRESS NOTE  Cyriah Childrey Kovar ZOX:[id number] DOB: 05-03-1928 DOA: 08/16/2022 PCP: Derinda Late, MD  Status: Remains inpatient appropriate because: Comfort Measures- Anticipate IP death  Level of care: Palliative Care   Code Status: DNR Family Communication: Daughter at bedside DVT prophylaxis: Patient is comfort measures   HPI: 89 year old female with prior history of GI bleeding due to angiodysplastic lesions, hypertension dyslipidemia, COPD who presented to the emergency department with weakness and melanotic stool.  Initial labs were not significantly abnormal or different from her baseline.  Unfortunately while in triage patient became unresponsive without a pulse and was found to be in PEA arrest.  She underwent 2 rounds of CPR and received a dose of epinephrine.  At time of admission she was able to tell the admitting physician her name her age and that she knew she was in the hospital and she knew it was the year of 2023.  In addition to the above patient was found to have symptoms of non-STEMI and there were significant concerns that patient would develop a profound ACS event due to underlying coronary disease in the context of evolving GI bleed.  While hitting physician was updating the family it was noted patient had a large 500 to 750 cc of tarry stool.  An extensive discussion was held by the admitting physician with the patient's daughter and the decision was made to pursue comfort measures.  Subjective: Patient resting comfortably on 1 mg of morphine infusion per hour.  Daughter updated at bedside.  Objective: Vitals:   September 20, 2022 0403 09/20/22 0538  BP:    Pulse: (!) 117 (!) 118  Resp: (!) 8 (!) 8  Temp:    SpO2: (!) 74% (!) 84%    Intake/Output Summary (Last 24 hours) at 09-20-22 8119 Last data filed at 08/31/2022 1851 Gross per 24 hour  Intake 1123.5 ml  Output --  Net 1123.5 ml   Filed Weights   08/07/2022 1401  Weight: 63.2 kg     Exam:  Constitutional: Sedated and appears comfortable Respiratory: Abnormal and at times agonal type respiratory pattern with wet sounding lungs-O2 sats have decreased to 74% and respiratory rate 8 breaths/min Cardiovascular: Regular rate and rhythm, blood pressure has not been checked, pulse documented at 117 bpm Abdomen: Does not appear to have auscultated bowel sounds Neurologic: Sedated and actively dying so this portion of exam deferred Psychiatric: Patient actively dying so exam deferred   Assessment/Plan:  PEA arrest  # Upper GI bleed # NSTEMI Patient now comfort measures Continue supportive care of family Continue morphine infusion for comfort    Data Reviewed: Basic Metabolic Panel: Recent Labs  Lab 08/09/2022 1421 08/30/2022 1602  NA 133*  --   K 4.0  --   CL 101  --   CO2 23  --   GLUCOSE 157*  --   BUN 17  --   CREATININE 0.71  --   CALCIUM 8.1*  --   MG  --  1.9  PHOS  --  4.5   Liver Function Tests: Recent Labs  Lab 08/22/2022 1421  AST 20  ALT 13  ALKPHOS 53  BILITOT 0.5  PROT 6.2*  ALBUMIN 3.4*   No results for input(s): "LIPASE", "AMYLASE" in the last 168 hours. No results for input(s): "AMMONIA" in the last 168 hours. CBC: Recent Labs  Lab 08/26/2022 1421 08/23/2022 1632  WBC 6.4  --   NEUTROABS 4.9  --   HGB 8.3* 9.6*  HCT 26.5* 29.6*  MCV 102.7*  --   PLT 335  --    Cardiac Enzymes: No results for input(s): "CKTOTAL", "CKMB", "CKMBINDEX", "TROPONINI" in the last 168 hours. BNP (last 3 results) Recent Labs    08/16/22 1430  BNP 98.2    ProBNP (last 3 results) No results for input(s): "PROBNP" in the last 8760 hours.  CBG: Recent Labs  Lab 08/21/22 0822 08/18/2022 1414  GLUCAP 108* 157*    No results found for this or any previous visit (from the past 240 hour(s)).   Studies: CT ABDOMEN PELVIS WO CONTRAST  Result Date: 08/20/2022 CLINICAL DATA:  Left lower quadrant abdominal pain EXAM: CT ABDOMEN AND PELVIS WITHOUT  CONTRAST TECHNIQUE: Multidetector CT imaging of the abdomen and pelvis was performed following the standard protocol without IV contrast. RADIATION DOSE REDUCTION: This exam was performed according to the departmental dose-optimization program which includes automated exposure control, adjustment of the mA and/or kV according to patient size and/or use of iterative reconstruction technique. COMPARISON:  07/22/2016 FINDINGS: Lower chest: Dependent bibasilar atelectasis or consolidation. Thoracic aortic and coronary artery calcification. Hepatobiliary: No focal liver abnormality is seen. Status post cholecystectomy. No biliary dilatation. Pancreas: Unremarkable. No pancreatic ductal dilatation or surrounding inflammatory changes. Spleen: Normal in size without focal abnormality. Adrenals/Urinary Tract: Unremarkable adrenal glands. Rounded 2.4 cm indeterminate lesion emanating from the mid to lower pole of the right kidney with internal density of 54 HU. Kidneys are otherwise unremarkable. Probable vascular calcifications in the bilateral hila. No obstructing stones. No hydronephrosis. Urinary bladder within normal limits. Stomach/Bowel: Stomach is within normal limits. Appendix appears normal (series 5, image 47). Extensive left-sided colonic diverticulosis. No evidence of bowel wall thickening, distention, or inflammatory changes. Vascular/Lymphatic: Extensive aortoiliac atherosclerosis. Fusiform infrarenal abdominal aortic aneurysm measuring 3.8 cm in diameter (series 2, image 27), previously measured 3.3 cm in 2017. No abdominopelvic lymphadenopathy. Reproductive: Status post hysterectomy. No adnexal masses. Other: No ascites.  No pneumoperitoneum. Musculoskeletal: No acute or significant osseous findings. IMPRESSION: 1. No acute abdominopelvic findings. 2. Extensive left-sided colonic diverticulosis without evidence of acute diverticulitis. 3. Dependent bibasilar atelectasis or consolidation. 4. Indeterminate 2.4  cm lesion emanating from the mid to lower pole of the right kidney, possibly a hyperdense cyst although solid mass not excluded. Further evaluation with nonemergent renal ultrasound is recommended. 5. Fusiform infrarenal abdominal aortic aneurysm measuring 3.8 cm in diameter, previously measured 3.3 cm in 2017. Recommend follow-up ultrasound every 2 years. This recommendation follows ACR consensus guidelines: White Paper of the ACR Incidental Findings Committee II on Vascular Findings. J Am Coll Radiol 2013; 10:789-794. 6. Aortic and coronary artery atherosclerosis (ICD10-I70.0). Electronically Signed   By: Davina Poke D.O.   On: 08/24/2022 17:33   DG Chest 1 View  Result Date: 08/26/2022 CLINICAL DATA:  CPR EXAM: CHEST  1 VIEW COMPARISON:  08/11/2022 FINDINGS: Stable heart size. Aortic atherosclerosis. Mild linear atelectasis at the left lung base. No pleural effusion. Presumed skin fold at the periphery of the upper right hemithorax. No evidence of a pneumothorax. No displaced rib fractures on frontal view. IMPRESSION: Mild linear atelectasis at the left lung base. Otherwise, no acute cardiopulmonary findings. Electronically Signed   By: Davina Poke D.O.   On: 08/11/2022 14:42    Scheduled Meds: Continuous Infusions:  morphine 1 mg/hr (September 22, 2022 0734)    Principal Problem:   Upper GI bleed Active Problems:   Melena   Tobacco use disorder   HLD (hyperlipidemia)   PEA (Pulseless electrical activity) ([place])   Consultants:  None  Procedures: CPR while in triage for PEA  Antibiotics: None   Time spent: 35 minutes    Erin Hearing ANP  [facility] 7 am - 330 pm/M-F for direct patient care and secure chat Please refer to Amion for contact info 1  days

## 2022-09-03 DEATH — deceased

## 2022-09-17 ENCOUNTER — Ambulatory Visit: Payer: Medicare Other | Admitting: Dermatology
